# Patient Record
Sex: Male | Born: 1950 | Race: White | Hispanic: No | Marital: Married | Smoking: Current every day smoker
Health system: Southern US, Community
[De-identification: ages and names within clinical notes are randomized; demographics above are authoritative.]

## PROBLEM LIST (undated history)

## (undated) DIAGNOSIS — I2510 Atherosclerotic heart disease of native coronary artery without angina pectoris: Secondary | ICD-10-CM

## (undated) DIAGNOSIS — K635 Polyp of colon: Secondary | ICD-10-CM

## (undated) DIAGNOSIS — J45909 Unspecified asthma, uncomplicated: Secondary | ICD-10-CM

## (undated) DIAGNOSIS — K219 Gastro-esophageal reflux disease without esophagitis: Secondary | ICD-10-CM

## (undated) DIAGNOSIS — M1990 Unspecified osteoarthritis, unspecified site: Secondary | ICD-10-CM

## (undated) HISTORY — DX: Polyp of colon: K63.5

## (undated) HISTORY — DX: Unspecified asthma, uncomplicated: J45.909

## (undated) HISTORY — PX: CERVICAL SPINE SURGERY: SHX589

## (undated) HISTORY — DX: Unspecified osteoarthritis, unspecified site: M19.90

## (undated) HISTORY — DX: Atherosclerotic heart disease of native coronary artery without angina pectoris: I25.10

## (undated) HISTORY — PX: LUMBAR LAMINECTOMY: SHX95

## (undated) HISTORY — PX: HERNIA REPAIR: SHX51

## (undated) HISTORY — PX: BACK SURGERY: SHX140

## (undated) HISTORY — PX: CATARACT EXTRACTION, BILATERAL: SHX1313

## (undated) HISTORY — DX: Gastro-esophageal reflux disease without esophagitis: K21.9

---

## 1998-11-09 ENCOUNTER — Ambulatory Visit (HOSPITAL_COMMUNITY): Admission: RE | Admit: 1998-11-09 | Discharge: 1998-11-09 | Payer: Self-pay | Admitting: Family Medicine

## 1998-11-09 ENCOUNTER — Encounter: Payer: Self-pay | Admitting: Family Medicine

## 1999-01-03 ENCOUNTER — Encounter: Payer: Self-pay | Admitting: Neurosurgery

## 1999-01-05 ENCOUNTER — Observation Stay (HOSPITAL_COMMUNITY): Admission: RE | Admit: 1999-01-05 | Discharge: 1999-01-06 | Payer: Self-pay | Admitting: Neurosurgery

## 1999-01-05 ENCOUNTER — Encounter: Payer: Self-pay | Admitting: Neurosurgery

## 1999-02-23 ENCOUNTER — Encounter: Payer: Self-pay | Admitting: Neurosurgery

## 1999-02-23 ENCOUNTER — Ambulatory Visit (HOSPITAL_COMMUNITY): Admission: RE | Admit: 1999-02-23 | Discharge: 1999-02-23 | Payer: Self-pay | Admitting: Neurosurgery

## 1999-05-22 ENCOUNTER — Ambulatory Visit (HOSPITAL_COMMUNITY): Admission: RE | Admit: 1999-05-22 | Discharge: 1999-05-22 | Payer: Self-pay | Admitting: Neurosurgery

## 1999-05-22 ENCOUNTER — Encounter: Payer: Self-pay | Admitting: Neurosurgery

## 1999-06-02 ENCOUNTER — Encounter: Payer: Self-pay | Admitting: Neurosurgery

## 1999-06-02 ENCOUNTER — Ambulatory Visit (HOSPITAL_COMMUNITY): Admission: RE | Admit: 1999-06-02 | Discharge: 1999-06-02 | Payer: Self-pay | Admitting: Neurosurgery

## 2007-05-20 ENCOUNTER — Encounter (INDEPENDENT_AMBULATORY_CARE_PROVIDER_SITE_OTHER): Payer: Self-pay | Admitting: Orthopedic Surgery

## 2007-05-20 ENCOUNTER — Ambulatory Visit (HOSPITAL_COMMUNITY): Admission: AD | Admit: 2007-05-20 | Discharge: 2007-05-21 | Payer: Self-pay | Admitting: Orthopedic Surgery

## 2007-11-14 ENCOUNTER — Encounter: Payer: Self-pay | Admitting: Gastroenterology

## 2007-12-30 ENCOUNTER — Ambulatory Visit: Payer: Self-pay | Admitting: Gastroenterology

## 2007-12-30 DIAGNOSIS — R634 Abnormal weight loss: Secondary | ICD-10-CM

## 2007-12-30 DIAGNOSIS — E538 Deficiency of other specified B group vitamins: Secondary | ICD-10-CM | POA: Insufficient documentation

## 2007-12-30 LAB — CONVERTED CEMR LAB
AFP-Tumor Marker: 1.6 ng/mL (ref 0.0–8.0)
Ammonia: 30 umol/L (ref 11–35)
HCV Ab: NEGATIVE
Hepatitis B Surface Ag: NEGATIVE
INR: 1 (ref 0.8–1.0)
Magnesium: 1.9 mg/dL (ref 1.5–2.5)
Prothrombin Time: 12 s (ref 10.9–13.3)
Sed Rate: 5 mm/hr (ref 0–16)
Tissue Transglutaminase Ab, IgA: 0.5 units (ref <7)
aPTT: 27.8 s (ref 21.7–29.8)

## 2008-01-14 ENCOUNTER — Ambulatory Visit: Payer: Self-pay | Admitting: Gastroenterology

## 2008-01-14 ENCOUNTER — Encounter: Payer: Self-pay | Admitting: Gastroenterology

## 2008-01-15 ENCOUNTER — Telehealth (INDEPENDENT_AMBULATORY_CARE_PROVIDER_SITE_OTHER): Payer: Self-pay | Admitting: *Deleted

## 2008-01-19 ENCOUNTER — Encounter: Payer: Self-pay | Admitting: Gastroenterology

## 2008-01-21 ENCOUNTER — Encounter: Payer: Self-pay | Admitting: Gastroenterology

## 2008-02-17 ENCOUNTER — Ambulatory Visit: Payer: Self-pay | Admitting: Gastroenterology

## 2008-02-17 DIAGNOSIS — K219 Gastro-esophageal reflux disease without esophagitis: Secondary | ICD-10-CM | POA: Insufficient documentation

## 2008-02-17 DIAGNOSIS — D126 Benign neoplasm of colon, unspecified: Secondary | ICD-10-CM | POA: Insufficient documentation

## 2008-02-17 DIAGNOSIS — D500 Iron deficiency anemia secondary to blood loss (chronic): Secondary | ICD-10-CM | POA: Insufficient documentation

## 2008-02-17 DIAGNOSIS — M549 Dorsalgia, unspecified: Secondary | ICD-10-CM | POA: Insufficient documentation

## 2008-03-23 ENCOUNTER — Telehealth: Payer: Self-pay | Admitting: Gastroenterology

## 2008-04-21 ENCOUNTER — Ambulatory Visit (HOSPITAL_COMMUNITY): Admission: RE | Admit: 2008-04-21 | Discharge: 2008-04-21 | Payer: Self-pay | Admitting: General Surgery

## 2008-06-08 ENCOUNTER — Telehealth: Payer: Self-pay | Admitting: Gastroenterology

## 2008-06-28 ENCOUNTER — Telehealth: Payer: Self-pay | Admitting: Gastroenterology

## 2008-06-30 ENCOUNTER — Inpatient Hospital Stay (HOSPITAL_COMMUNITY): Admission: RE | Admit: 2008-06-30 | Discharge: 2008-07-02 | Payer: Self-pay | Admitting: Neurological Surgery

## 2008-07-14 ENCOUNTER — Telehealth: Payer: Self-pay | Admitting: Gastroenterology

## 2008-08-02 ENCOUNTER — Encounter: Admission: RE | Admit: 2008-08-02 | Discharge: 2008-08-02 | Payer: Self-pay | Admitting: Neurological Surgery

## 2008-10-04 ENCOUNTER — Encounter: Admission: RE | Admit: 2008-10-04 | Discharge: 2008-10-04 | Payer: Self-pay | Admitting: Neurological Surgery

## 2008-12-20 ENCOUNTER — Encounter (INDEPENDENT_AMBULATORY_CARE_PROVIDER_SITE_OTHER): Payer: Self-pay | Admitting: *Deleted

## 2009-01-04 ENCOUNTER — Encounter: Admission: RE | Admit: 2009-01-04 | Discharge: 2009-01-04 | Payer: Self-pay | Admitting: Neurological Surgery

## 2009-01-04 ENCOUNTER — Ambulatory Visit: Payer: Self-pay | Admitting: Gastroenterology

## 2009-01-18 ENCOUNTER — Encounter: Admission: RE | Admit: 2009-01-18 | Discharge: 2009-01-18 | Payer: Self-pay | Admitting: Neurological Surgery

## 2009-01-31 ENCOUNTER — Ambulatory Visit: Payer: Self-pay | Admitting: Gastroenterology

## 2009-01-31 ENCOUNTER — Encounter: Payer: Self-pay | Admitting: Gastroenterology

## 2009-02-07 ENCOUNTER — Encounter: Payer: Self-pay | Admitting: Gastroenterology

## 2009-03-03 ENCOUNTER — Ambulatory Visit (HOSPITAL_COMMUNITY): Admission: RE | Admit: 2009-03-03 | Discharge: 2009-03-04 | Payer: Self-pay | Admitting: Neurological Surgery

## 2009-04-12 ENCOUNTER — Encounter: Admission: RE | Admit: 2009-04-12 | Discharge: 2009-04-12 | Payer: Self-pay | Admitting: Neurological Surgery

## 2010-02-06 ENCOUNTER — Encounter: Admission: RE | Admit: 2010-02-06 | Discharge: 2010-02-06 | Payer: Self-pay | Admitting: Neurological Surgery

## 2010-02-16 ENCOUNTER — Ambulatory Visit (HOSPITAL_BASED_OUTPATIENT_CLINIC_OR_DEPARTMENT_OTHER): Admission: RE | Admit: 2010-02-16 | Discharge: 2010-02-16 | Payer: Self-pay | Admitting: General Surgery

## 2010-05-22 ENCOUNTER — Encounter: Payer: Self-pay | Admitting: Neurological Surgery

## 2010-05-30 NOTE — Letter (Signed)
Summary: Patient Notice- Polyp Results   Gastroenterology  [address] [place], [place] [address]   Phone: [phone]  Fax: [phone]        January 19, 2008 MRN: [id number]    Wesley Schultz [address] [place], [place]  [address]    Dear [name],  I am pleased to inform you that the colon polyp(s) removed during your recent colonoscopy was (were) found to be benign (no cancer detected) upon pathologic examination.  I recommend you have a repeat colonoscopy examination in 1 year to look for recurrent polyps, as having colon polyps increases your risk for having recurrent polyps or even colon cancer in the future.  Should you develop new or worsening symptoms of abdominal pain, bowel habit changes or bleeding from the rectum or bowels, please schedule an evaluation with either your primary care physician or with me.  Additional information/recommendations:  __ No further action with gastroenterology is needed at this time. Please      follow-up with your primary care physician for your other healthcare      needs.  __ Please call [phone] to schedule a return visit to review your      situation.  __ Please keep your follow-up visit as already scheduled.  xx__ Continue treatment plan as outlined the day of your exam.  Please call us if you are having persistent problems or have questions about your condition that have not been fully answered at this time.  Sincerely,    Mardella Layman MD [facility]  This letter has been electronically signed by your physician.

## 2010-07-12 LAB — DIFFERENTIAL
Basophils Absolute: 0 10*3/uL (ref 0.0–0.1)
Basophils Relative: 0 % (ref 0–1)
Eosinophils Absolute: 0.2 10*3/uL (ref 0.0–0.7)
Eosinophils Relative: 2 % (ref 0–5)
Lymphocytes Relative: 40 % (ref 12–46)
Lymphs Abs: 2.8 10*3/uL (ref 0.7–4.0)
Monocytes Absolute: 0.6 10*3/uL (ref 0.1–1.0)
Monocytes Relative: 8 % (ref 3–12)
Neutro Abs: 3.5 10*3/uL (ref 1.7–7.7)
Neutrophils Relative %: 50 % (ref 43–77)

## 2010-07-12 LAB — BASIC METABOLIC PANEL
BUN: 20 mg/dL (ref 6–23)
CO2: 28 mEq/L (ref 19–32)
Calcium: 9.1 mg/dL (ref 8.4–10.5)
Chloride: 103 mEq/L (ref 96–112)
Creatinine, Ser: 0.93 mg/dL (ref 0.4–1.5)
GFR calc Af Amer: >60 mL/min (ref >60)
GFR calc non Af Amer: >60 mL/min (ref >60)
Glucose, Bld: 100 mg/dL — ABNORMAL HIGH (ref 70–99)
Potassium: 3.9 mEq/L (ref 3.5–5.1)
Sodium: 137 mEq/L (ref 135–145)

## 2010-07-12 LAB — CBC
HCT: 44.3 % (ref 39.0–52.0)
Hemoglobin: 15 g/dL (ref 13.0–17.0)
MCH: 29.5 pg (ref 26.0–34.0)
MCHC: 33.9 g/dL (ref 30.0–36.0)
MCV: 87 fL (ref 78.0–100.0)
Platelets: 199 10*3/uL (ref 150–400)
RBC: 5.09 MIL/uL (ref 4.22–5.81)
RDW: 12.9 % (ref 11.5–15.5)
WBC: 7 10*3/uL (ref 4.0–10.5)

## 2010-08-02 LAB — BASIC METABOLIC PANEL
BUN: 20 mg/dL (ref 6–23)
CO2: 27 mEq/L (ref 19–32)
Calcium: 8.8 mg/dL (ref 8.4–10.5)
Chloride: 101 mEq/L (ref 96–112)
Creatinine, Ser: 0.77 mg/dL (ref 0.4–1.5)
GFR calc Af Amer: >60 mL/min (ref >60)
GFR calc non Af Amer: >60 mL/min (ref >60)
Glucose, Bld: 99 mg/dL (ref 70–99)
Potassium: 4 mEq/L (ref 3.5–5.1)
Sodium: 135 mEq/L (ref 135–145)

## 2010-08-02 LAB — DIFFERENTIAL
Basophils Absolute: 0 10*3/uL (ref 0.0–0.1)
Basophils Relative: 1 % (ref 0–1)
Eosinophils Absolute: 0.2 10*3/uL (ref 0.0–0.7)
Eosinophils Relative: 3 % (ref 0–5)
Lymphocytes Relative: 36 % (ref 12–46)
Lymphs Abs: 2.3 10*3/uL (ref 0.7–4.0)
Monocytes Absolute: 0.5 10*3/uL (ref 0.1–1.0)
Monocytes Relative: 8 % (ref 3–12)
Neutro Abs: 3.4 10*3/uL (ref 1.7–7.7)
Neutrophils Relative %: 53 % (ref 43–77)

## 2010-08-02 LAB — PROTIME-INR
INR: 0.96 (ref 0.00–1.49)
Prothrombin Time: 12.7 seconds (ref 11.6–15.2)

## 2010-08-02 LAB — CBC
HCT: 44.5 % (ref 39.0–52.0)
Hemoglobin: 14.9 g/dL (ref 13.0–17.0)
MCHC: 33.6 g/dL (ref 30.0–36.0)
MCV: 88.2 fL (ref 78.0–100.0)
Platelets: 173 10*3/uL (ref 150–400)
RBC: 5.05 MIL/uL (ref 4.22–5.81)
RDW: 14.1 % (ref 11.5–15.5)
WBC: 6.5 10*3/uL (ref 4.0–10.5)

## 2010-08-02 LAB — APTT: aPTT: 29 seconds (ref 24–37)

## 2010-08-10 LAB — BASIC METABOLIC PANEL
BUN: 16 mg/dL (ref 6–23)
CO2: 28 mEq/L (ref 19–32)
Calcium: 9.4 mg/dL (ref 8.4–10.5)
Chloride: 106 mEq/L (ref 96–112)
Creatinine, Ser: 0.98 mg/dL (ref 0.4–1.5)
GFR calc Af Amer: >60 mL/min (ref >60)
GFR calc non Af Amer: >60 mL/min (ref >60)
Glucose, Bld: 135 mg/dL — ABNORMAL HIGH (ref 70–99)
Potassium: 4.4 mEq/L (ref 3.5–5.1)
Sodium: 140 mEq/L (ref 135–145)

## 2010-08-10 LAB — DIFFERENTIAL
Basophils Absolute: 0 10*3/uL (ref 0.0–0.1)
Basophils Relative: 1 % (ref 0–1)
Neutro Abs: 4.3 10*3/uL (ref 1.7–7.7)
Neutrophils Relative %: 62 % (ref 43–77)

## 2010-08-10 LAB — CBC
HCT: 47.9 % (ref 39.0–52.0)
Hemoglobin: 16.3 g/dL (ref 13.0–17.0)
MCHC: 34.2 g/dL (ref 30.0–36.0)
MCV: 88.5 fL (ref 78.0–100.0)
Platelets: 185 10*3/uL (ref 150–400)
RBC: 5.41 MIL/uL (ref 4.22–5.81)
RDW: 13.4 % (ref 11.5–15.5)
WBC: 7 10*3/uL (ref 4.0–10.5)

## 2010-08-10 LAB — ABO/RH: ABO/RH(D): A POS

## 2010-08-10 LAB — PROTIME-INR
INR: 1 (ref 0.00–1.49)
Prothrombin Time: 13.2 seconds (ref 11.6–15.2)

## 2010-08-10 LAB — APTT: aPTT: 28 seconds (ref 24–37)

## 2010-09-12 NOTE — Op Note (Signed)
NAMEMATTHER, LABELL NO.:  [id number]   MEDICAL RECORD NO.:  [id number]          PATIENT TYPE:  INP   LOCATION:  3004                         FACILITY:  [facility]   PHYSICIAN:  Tia Alert, MD     DATE OF BIRTH:  06-08-1950   DATE OF PROCEDURE:  06/30/2008  DATE OF DISCHARGE:                               OPERATIVE REPORT   PREOPERATIVE DIAGNOSES:  1. 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  2. Degenerative spondylolisthesis L4-5.  3. 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  4. Failed back syndrome.  5. Back pain.  6. Leg pain.   POSTOPERATIVE DIAGNOSES:  1. 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  2. Degenerative spondylolisthesis L4-5.  3. 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  4. Failed back syndrome.  5. Back pain.  6. Leg pain.   PROCEDURES:  1. Lumbar re-exploration with lumbar decompressive laminectomy, hemi-      facetectomy and foraminotomies L4-5 with decompression of the L4      and the L5 nerve roots, requiring more work than is typically      required as a typical post procedure because of his lumbar spinal      stenosis.  2. Posterior lumbar interbody fusion L4-5 utilizing a 12 x 22-mm PEEK      interbody cage packed with local autograft and Actifuse putty and a      12 x 22-mm tangent interbody bone wedge.  3. Intertransverse arthrodesis L4-5 utilizing locally harvested      morselized autologous bone graft and Actifuse putty.  4. Nonsegmental fixation L4-5 utilizing the legacy pedicle screw      system.   SURGEON:  Tia Alert, MD   ASSISTANT:  Donalee Citrin, MD   ANESTHESIA:  General endotracheal.   COMPLICATIONS:  None apparent.   INDICATIONS FOR PROCEDURE:  Wesley Schultz is a 60 year old gentleman who was  referred with back and leg pain.  He had undergone a previous  microdiskectomy L4-5 on the right side by another Careers adviser.  He had an  MRI and plain films, which showed a grade 1 spondylolisthesis at L4-5.  He had spinal stenosis at L4-5 with facet arthropathy and  degenerative  disk disease.  I recommended a lumbar decompression and instrumented  fusion to address both the stenosis and the segmental instability.  He  understood the risks, benefits, expected outcome, and wished to proceed.   DESCRIPTION OF PROCEDURE:  The patient was taken to the operating room  and after induction of adequate generalized endotracheal anesthesia, he  was rolled into prone position on chest rolls and all pressure points  were padded.  The lumbar region was prepped with DuraPrep and draped in  the usual sterile fashion.  A 10 mL of local anesthesia was injected and  a dorsal midline incision was made and carried down to the lumbosacral  fascia.  The fascia was opened.  The paraspinous musculature was taken  down in a subperiosteal fashion to expose L4-5.  Intraoperative  fluoroscopy confirmed my level and then I took the dissection out over  the facets to expose transverse processes of L4  and L5 bilaterally.  I  then used Leksell rongeur to remove the spinous process, then used the  Kerrison punch to perform a complete laminectomy, hemi facetectomy, and  foraminotomy at L4-5.  The L4 and L5 nerve roots were identified to  decompress.  There was scar tissue on the right.  I was able to detail  the L5 nerve root from the scar tissue and then I found a fairly  significant recurrent disk herniation and large annular tear at L4-5 on  the right side.  He also had a smaller herniation with a small-free  fragment of L4-5 on the left side.  I coagulated the epidural venous  vasculature.  I cut this sharply bilaterally and used sequential  distraction to distract the disk space up to 12 mm and checked this  under fluoroscopy.  We then used a 12-mm rotating cutter and cutting  chisel to prepare the endplates for arthrodesis.  The midline was  prepared with an Epstein curette and total diskectomy was completed.  We  used a 12 x 22-mm tangent interbody bone wedge on the patient's  right  side and a 12 x 22-mm PEEK interbody cage on the patient's left side.  This was packed with local autograft and Actifuse putty.  The midline  was also packed with local autograft and Actifuse putty.  I then turned  my attention to the nonsegmental fixation.  I had localized the pedicle  screw entry zones utilizing surface landmarks and lateral fluoroscopy.  We probed each pedicle with a pedicle probe, tapped each pedicle with a  5.5 tap, probed these pedicle with a ball probe to assure no break in  the cortex and then placed a 65 x 50-mm pedicle screw into the L4  pedicles bilaterally and 65 x 45-mm pedicle screw into the L5 pedicles  bilaterally.  We then decorticated transverse processes and placed a  mixture of local autograft and Actifuse out over these to perform a  transverse arthrodesis L4-5.  We irrigated with saline solution  containing bacitracin.  We then placed our lordotic rods into multiaxial  screw heads of the pedicle screws and locked these into position with  the locking caps and the anti-torque device while achieving compression  of our grafts.  We then inspected nerve roots once again to assure  adequate decompression and palpated along these with a coronary dilator,  then lined the dura with Gelfoam, placed a medium Hemovac drain through  a separate stab incision, then closed the muscle and the fascia with 0  Vicryl closed using subcutaneous and subcuticular tissue with 2-0 and 3-  0 Vicryl and closed skin with Benzoin and Steri-Strips.  The drapes were  removed.  A sterile dressing was applied.  The patient was awakened from  general anesthesia and transferred to recovery room in stable condition.  At the end of the procedure, all sponge, needle and instrument counts  were correct.       Tia Alert, MD  Electronically Signed     DSJ/MEDQ  D:  06/30/2008  T:  07/01/2008  Job:  [id number]

## 2010-09-12 NOTE — Op Note (Signed)
NAMEPAX, Wesley Schultz NO.:  [id number]   MEDICAL RECORD NO.:  [id number]          PATIENT TYPE:  AMB   LOCATION:  DAY                          FACILITY:  [facility]   PHYSICIAN:  Juanetta Gosling, MDDATE OF BIRTH:  [birth year]   DATE OF PROCEDURE:  04/21/2008  DATE OF DISCHARGE:                               OPERATIVE REPORT   PREOPERATIVE DIAGNOSIS:  Back mass as well as a posterior neck mass.   POSTOPERATIVE DIAGNOSIS:  Back mass as well as a posterior neck mass.   PROCEDURE:  Excisional biopsy of neck mass x2.   SURGEON:  Juanetta Gosling, M.D.   ASSISTANT:  None.   ANESTHESIA:  General.   FINDINGS:  These appear to be lipomas that were sent to pathology.   DRAINS:  Penrose drain to posterior neck incision.   ESTIMATED BLOOD LOSS:  Minimal.   COMPLICATIONS:  None.   DISPOSITION:  To recovery room in stable condition.   INDICATIONS:  Mr. Tennyson is a 60 year old male who was referred for a lump  on his posterior neck as well as his back.  It had been slowly  increasing in size.  On exam, he appeared to have lipomas in both  locations, so we discussed biopsying these.   PROCEDURE:  After informed consent was obtained, the patient was taken  to the operating room.  He was administered 1 gm of intravenous  cefazolin.  Serial compression devices were placed on the lower  extremities.  He was administered at breathing treatment prior to this  due to his wheezing.  He was then placed under general endotracheal  anesthesia without complication.  He was then rolled in the prone  position and appropriately padded.  His back and posterior neck were  then prepped and draped in a standard sterile surgical fashion.  A  surgical time-out was then performed.   About an 8-cm incision was made overlying his posterior back mass, and  dissection carried out down to the level of the mass.  This was a  lipoma.  Electrocautery was used to remove this from the muscle as  well  as the surrounding tissues, and this was passed off the table as a  specimen.  Hemostasis was observed in this wound.  I then made about a 4-  cm incision overlying the posterior neck mass and using electrocautery  to remove the mass.  This also tracked all the way down to his muscle.  Following this, I then closed the back fascia with a 2-0 Vicryl to  decrease the dead space, of which there was a minimal amount upon  completion.  I then closed the skin with external 2-0 nylon sutures.  I  closed the posterior neck wound in the same fashion with 2-0 Vicryl as  well as 2-0 nylon to the skin.  I did  leave a small Penrose drain located.  Sterile dressings were then  applied.  I infiltrated 9 cc of 1% lidocaine upon completion of the  operation as well.  He tolerated this well.  He was rolled supine,  extubated in the operating room, and transferred to the recovery room in  stable condition.      Juanetta Gosling, MD  Electronically Signed     MCW/MEDQ  D:  04/21/2008  T:  04/21/2008  Job:  [id number]   cc:   Ernestina Penna, M.D.  Fax: [phone]

## 2010-09-12 NOTE — Op Note (Signed)
Wesley Schultz, Wesley Schultz                  ACCOUNT NO.:  [id number]   MEDICAL RECORD NO.:  [id number]          PATIENT TYPE:  AMB   LOCATION:  DAY                          [facility]   PHYSICIAN:  Marlowe Kays, M.D.  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   OPERATION PERFORMED:  Central, lateral, and foraminal decompression L4-5  right with microdiskectomy and freeing up of L5 nerve root and partial  resection of synovial cyst.   SURGEON:  Dr. Fayrene Fearing [place]   ASSISTANT:  Dr. Worthy Rancher   ANESTHESIA:  General.   PATHOLOGY AND JUSTIFICATION FOR PROCEDURE:  He has had persistent right  leg pain leading to an MRI on 02/17/2007 demonstrating diffuse  spondylosis and what was interpreted as a disk herniation at L4-5 on the  right with a free fragment caudad.  He has failed to respond to  nonsurgical treatment and consequently is here today for the above-  mentioned surgery.  See operative description below for additional  details.   DESCRIPTION OF PROCEDURE:  Prophylactic antibiotics, satisfactory  general anesthesia, knee-chest position on the [place] frame.  The back  was prepped with DuraPrep and with 2 spinal needles and a lateral x-ray,  I tentatively located at the L4-5 interspace.  I then continued draping  the back in a sterile field.  Ioban employed.  Timeout done.  Vertical  midline incision based on the initial x-ray.  Soft tissue was dissected  off what we presumed was the lamina of L4 and L5 and self-retaining  McCullough retractor inserted.  A second lateral x-ray with a Penfield 4  instrument pointing in the intraluminal interval pointed directly to the  L4-5 interspace.   Based on this, we continued our dissection and began  trying to undermine the __________ surface of L4 and L5.  It soon became  apparent that he had very significant stenosis at this point, and this  necessitated a central decompression approach which gave [place] immediate  access to the pathology.  He had a very thick ligamentum flavum  consistent with spinal stenosis which we teased of with 2 and 3 mm  Kerrison rongeurs.  He had a significant lateral recess stenosis which  we gradually decompressed allowing [place] to find the L5 nerve root.  A wide  foraminotomy was performed.  The L5 nerve root was quite adherent to the  underlying tissues.  Working first on the cephalad area, I was able to  locate the L4-5 disk with the spinal need and entered it and got out a  modest amount of disk material.  This freed up the L5 nerve root enough  that we were able to retract it to work distally.  No free fragment was  found.  Instead we found a large synovial cyst which was freed up from  the lateral recess bone.  It was also adherent to the dura deforming the  L5 nerve root somewhat.  We felt that we would do undue damage to the  nerve root and also very likely create a dural tear if we tried to  dissect too much of the synovial cyst off the dura, but we did free it  up from its lateral attachment.  A wide hockey-stick was placed in the  foramen, and it was widely patent and also beneath the L5 nerve root  which was very movable.  We concluded that there was no free fragment  but that it was a synovial cyst which was really the amorphous material  on the MRI.  We then irrigated the wound with sterile saline and placed  Gelfoam soaked in thrombin over the interspace and in the lateral recess  and around the L5 nerve root and dura.  There is no unusual bleeding  after removing the caudal retractors.  The wound was closed in layers  with interrupted #1 Vicryl in the fascia, 2-0 Vicryl subcutaneous  tissues,  and staples in the skin.  Betadine, Adaptic and dry sterile  dressings were applied, and he was gently placed on his PACU bed and  taken there in satisfactory condition with no known complications.           ______________________________  Marlowe Kays, M.D.     JA/MEDQ  D:  05/20/2007  T:  05/21/2007  Job:  [id number]

## 2011-02-02 LAB — DIFFERENTIAL
Basophils Relative: 1 % (ref 0–1)
Lymphocytes Relative: 30 % (ref 12–46)
Monocytes Absolute: 0.6 10*3/uL (ref 0.1–1.0)
Monocytes Relative: 10 % (ref 3–12)
Neutro Abs: 3.6 10*3/uL (ref 1.7–7.7)

## 2011-02-02 LAB — BASIC METABOLIC PANEL
CO2: 31 mEq/L (ref 19–32)
Calcium: 9.4 mg/dL (ref 8.4–10.5)
GFR calc Af Amer: >60 mL/min (ref >60)
Sodium: 142 mEq/L (ref 135–145)

## 2011-02-02 LAB — CBC
Hemoglobin: 16.2 g/dL (ref 13.0–17.0)
MCHC: 32.6 g/dL (ref 30.0–36.0)
RBC: 5.51 MIL/uL (ref 4.22–5.81)

## 2011-05-04 ENCOUNTER — Encounter: Payer: Self-pay | Admitting: Gastroenterology

## 2012-01-10 ENCOUNTER — Encounter: Payer: Self-pay | Admitting: Gastroenterology

## 2012-01-30 ENCOUNTER — Ambulatory Visit (AMBULATORY_SURGERY_CENTER): Payer: Medicare PPO | Admitting: *Deleted

## 2012-01-30 VITALS — Ht 72.0 in | Wt 188.5 lb

## 2012-01-30 DIAGNOSIS — Z1211 Encounter for screening for malignant neoplasm of colon: Secondary | ICD-10-CM

## 2012-01-30 MED ORDER — MOVIPREP 100 G PO SOLR: ORAL | Status: DC

## 2012-02-13 ENCOUNTER — Encounter: Payer: Self-pay | Admitting: Gastroenterology

## 2012-02-13 ENCOUNTER — Ambulatory Visit (AMBULATORY_SURGERY_CENTER): Payer: Medicare PPO | Admitting: Gastroenterology

## 2012-02-13 VITALS — BP 124/62 | HR 58 | Temp 97.0°F | Resp 24 | Ht 72.0 in | Wt 188.0 lb

## 2012-02-13 DIAGNOSIS — Z860100 Personal history of colon polyps, unspecified: Secondary | ICD-10-CM

## 2012-02-13 DIAGNOSIS — Z1211 Encounter for screening for malignant neoplasm of colon: Secondary | ICD-10-CM

## 2012-02-13 DIAGNOSIS — D126 Benign neoplasm of colon, unspecified: Secondary | ICD-10-CM

## 2012-02-13 MED ORDER — SODIUM CHLORIDE 0.9 % IV SOLN: 500.0000 mL | INTRAVENOUS | Status: DC

## 2012-02-13 NOTE — Op Note (Signed)
[facility] [address] [place], [address]   COLONOSCOPY PROCEDURE REPORT  PATIENT: Wesley Schultz, Wesley Schultz  MR#: [id number] BIRTHDATE: Mar 02, 1951 , 61  yrs. old GENDER: Male ENDOSCOPIST: Mardella Layman, MD, [facility] REFERRED BY: PROCEDURE DATE:  02/13/2012 PROCEDURE:   Colonoscopy with snare polypectomy ASA CLASS:   Class II INDICATIONS:patient's personal history of adenomatous colon polyps.  MEDICATIONS: propofol (Diprivan) 200mg  IV  DESCRIPTION OF PROCEDURE:   After the risks and benefits and of the procedure were explained, informed consent was obtained.  A digital rectal exam revealed no abnormalities of the rectum.    The LB CF-[id number]  endoscope was introduced through the anus and advanced to the cecum, which was identified by both the appendix and ileocecal valve .  The quality of the prep was adequate. .  The instrument was then slowly withdrawn as the colon was fully examined.     COLON FINDINGS: The colon was redundant.  Manual abdominal counter-pressure was used to reach the cecum.   A polypoid shaped 2 cm. sessile polyp with a friable surface was found in the descending colon.  Spreading polyp piecemeal removed with hot snare..   The colon mucosa was otherwise normal.     Retroflexed views revealed no abnormalities.     The scope was then withdrawn from the patient and the procedure completed.  COMPLICATIONS: There were no complications. ENDOSCOPIC IMPRESSION: 1.   The colon was redundant 2.   Sessile polyp was found in the descending colon ..adenoma,r/o atypia 3.   The colon mucosa was otherwise normal  RECOMMENDATIONS: 1.  await pathology results 2.   f/u exam 3 years   REPEAT EXAM:  [id number] Christell Constant, MD  _______________________________ eSigned:  Mardella Layman, MD, [facility] 02/13/2012 11:09 AM

## 2012-02-13 NOTE — Patient Instructions (Addendum)
Discharge instructions given with verbal understanding. Handout on polyps given. Resume previous medications. YOU HAD AN ENDOSCOPIC PROCEDURE TODAY AT THE [facility]: Refer to the procedure report that was given to you for any specific questions about what was found during the examination.  If the procedure report does not answer your questions, please call your gastroenterologist to clarify.  If you requested that your care partner not be given the details of your procedure findings, then the procedure report has been included in a sealed envelope for you to review at your convenience later.  YOU SHOULD EXPECT: Some feelings of bloating in the abdomen. Passage of more gas than usual.  Walking can help get rid of the air that was put into your GI tract during the procedure and reduce the bloating. If you had a lower endoscopy (such as a colonoscopy or flexible sigmoidoscopy) you may notice spotting of blood in your stool or on the toilet paper. If you underwent a bowel prep for your procedure, then you may not have a normal bowel movement for a few days.  DIET: Your first meal following the procedure should be a light meal and then it is ok to progress to your normal diet.  A half-sandwich or bowl of soup is an example of a good first meal.  Heavy or fried foods are harder to digest and may make you feel nauseous or bloated.  Likewise meals heavy in dairy and vegetables can cause extra gas to form and this can also increase the bloating.  Drink plenty of fluids but you should avoid alcoholic beverages for 24 hours.  ACTIVITY: Your care partner should take you home directly after the procedure.  You should plan to take it easy, moving slowly for the rest of the day.  You can resume normal activity the day after the procedure however you should NOT DRIVE or use heavy machinery for 24 hours (because of the sedation medicines used during the test).    SYMPTOMS TO REPORT IMMEDIATELY: A  gastroenterologist can be reached at any hour.  During normal business hours, 8:30 AM to 5:00 PM Monday through Friday, call [phone].  After hours and on weekends, please call the GI answering service at [phone] who will take a message and have the physician on call contact you.   Following lower endoscopy (colonoscopy or flexible sigmoidoscopy):  Excessive amounts of blood in the stool  Significant tenderness or worsening of abdominal pains  Swelling of the abdomen that is new, acute  Fever of 100F or higher  FOLLOW UP: If any biopsies were taken you will be contacted by phone or by letter within the next 1-3 weeks.  Call your gastroenterologist if you have not heard about the biopsies in 3 weeks.  Our staff will call the home number listed on your records the next business day following your procedure to check on you and address any questions or concerns that you may have at that time regarding the information given to you following your procedure. This is a courtesy call and so if there is no answer at the home number and we have not heard from you through the emergency physician on call, we will assume that you have returned to your regular daily activities without incident.  SIGNATURES/CONFIDENTIALITY: You and/or your care partner have signed paperwork which will be entered into your electronic medical record.  These signatures attest to the fact that that the information above on your After Visit Summary has   been reviewed and is understood.  Full responsibility of the confidentiality of this discharge information lies with you and/or your care-partner. 

## 2012-02-14 ENCOUNTER — Telehealth: Payer: Self-pay | Admitting: *Deleted

## 2012-02-14 NOTE — Telephone Encounter (Signed)
  Follow up Call-  Call back number 02/13/2012  Post procedure Call Back phone  # [phone]  Permission to leave phone message Yes     No answer and no answering picked up so no message left.

## 2012-02-18 ENCOUNTER — Encounter: Payer: Self-pay | Admitting: Gastroenterology

## 2012-06-20 ENCOUNTER — Ambulatory Visit
Admission: RE | Admit: 2012-06-20 | Discharge: 2012-06-20 | Disposition: A | Discharge status: Home / Self Care | Payer: Medicare PPO | Source: Ambulatory Visit | Attending: Family Medicine | Admitting: Family Medicine

## 2012-06-20 ENCOUNTER — Other Ambulatory Visit: Payer: Self-pay | Admitting: Family Medicine

## 2012-06-20 DIAGNOSIS — R220 Localized swelling, mass and lump, head: Secondary | ICD-10-CM

## 2012-06-20 MED ORDER — IOHEXOL 300 MG/ML  SOLN
75.0000 mL | Freq: Once | INTRAMUSCULAR | Status: AC | PRN
  Administered 2012-06-20: 75 mL via INTRAVENOUS

## 2013-12-22 ENCOUNTER — Ambulatory Visit (INDEPENDENT_AMBULATORY_CARE_PROVIDER_SITE_OTHER): Payer: Medicare PPO | Admitting: Family Medicine

## 2013-12-22 ENCOUNTER — Encounter (INDEPENDENT_AMBULATORY_CARE_PROVIDER_SITE_OTHER): Payer: Self-pay

## 2013-12-22 VITALS — BP 119/73 | HR 71 | Temp 96.9°F | Ht 72.0 in | Wt 198.0 lb

## 2013-12-22 DIAGNOSIS — J45909 Unspecified asthma, uncomplicated: Secondary | ICD-10-CM

## 2013-12-22 DIAGNOSIS — K2100 Gastro-esophageal reflux disease with esophagitis, without bleeding: Secondary | ICD-10-CM

## 2013-12-22 DIAGNOSIS — B351 Tinea unguium: Secondary | ICD-10-CM

## 2013-12-22 DIAGNOSIS — R21 Rash and other nonspecific skin eruption: Secondary | ICD-10-CM

## 2013-12-22 MED ORDER — OMEPRAZOLE 20 MG PO CPDR: 20.0000 mg | DELAYED_RELEASE_CAPSULE | Freq: Two times a day (BID) | ORAL | Status: DC

## 2013-12-22 MED ORDER — CLOTRIMAZOLE-BETAMETHASONE 1-0.05 % EX CREA: 1.0000 "application " | TOPICAL_CREAM | Freq: Two times a day (BID) | CUTANEOUS | Status: DC

## 2013-12-22 MED ORDER — METHYLPREDNISOLONE ACETATE 80 MG/ML IJ SUSP
80.0000 mg | Freq: Once | INTRAMUSCULAR | Status: AC
  Administered 2013-12-22: 80 mg via INTRAMUSCULAR

## 2013-12-22 MED ORDER — TERBINAFINE HCL 250 MG PO TABS: 250.0000 mg | ORAL_TABLET | Freq: Every day | ORAL | Status: DC

## 2013-12-22 MED ORDER — ALBUTEROL SULFATE HFA 108 (90 BASE) MCG/ACT IN AERS: 2.0000 | INHALATION_SPRAY | Freq: Four times a day (QID) | RESPIRATORY_TRACT | Status: DC | PRN

## 2013-12-24 NOTE — Progress Notes (Signed)
   Subjective:    Patient ID: Wesley Schultz, male    DOB: 27-Mar-1951, 63 y.o.   MRN: [id number]  HPI  This 63 y.o. male presents for evaluation of rash on lower extremities and mycotic toenails.  Review of Systems    No chest pain, SOB, HA, dizziness, vision change, N/V, diarrhea, constipation, dysuria, urinary urgency or frequency, myalgias, arthralgias or rash.  Objective:   Physical Exam  Vital signs noted  Well developed well nourished male.  HEENT - Head atraumatic Normocephalic                Eyes - PERRLA, Conjuctiva - clear Sclera- Clear EOMI                Ears - EAC's Wnl TM's Wnl Gross Hearing WNL                Nose - Nares patent                 Throat - oropharanx wnl Respiratory - Lungs CTA bilateral Cardiac - RRR S1 and S2 without murmur GI - Abdomen soft Nontender and bowel sounds active x 4 Extremities - No edema. Neuro - Grossly intact. Skin - rash on bilateral lower extremities and toes with onychomycosis     Assessment & Plan:  Rash and nonspecific skin eruption - Plan: clotrimazole-betamethasone (LOTRISONE) cream, methylPREDNISolone acetate (DEPO-MEDROL) injection 80 mg  Onychomycosis - Plan: terbinafine (LAMISIL) 250 MG tablet  Gastroesophageal reflux disease with esophagitis - Plan: omeprazole (PRILOSEC) 20 MG capsule  Unspecified asthma(493.90) - Plan: albuterol (PROVENTIL HFA;VENTOLIN HFA) 108 (90 BASE) MCG/ACT inhaler  Lysbeth Penner FNP

## 2014-03-30 DIAGNOSIS — Z029 Encounter for administrative examinations, unspecified: Secondary | ICD-10-CM

## 2014-06-07 ENCOUNTER — Other Ambulatory Visit: Payer: Self-pay | Admitting: Family Medicine

## 2014-06-07 DIAGNOSIS — J45909 Unspecified asthma, uncomplicated: Secondary | ICD-10-CM

## 2014-06-07 MED ORDER — ALBUTEROL SULFATE HFA 108 (90 BASE) MCG/ACT IN AERS: 2.0000 | INHALATION_SPRAY | Freq: Four times a day (QID) | RESPIRATORY_TRACT | Status: DC | PRN

## 2014-06-07 NOTE — Telephone Encounter (Signed)
done

## 2014-09-06 ENCOUNTER — Other Ambulatory Visit: Payer: Self-pay | Admitting: Family Medicine

## 2014-09-09 ENCOUNTER — Other Ambulatory Visit: Payer: Self-pay

## 2014-09-09 DIAGNOSIS — K2100 Gastro-esophageal reflux disease with esophagitis, without bleeding: Secondary | ICD-10-CM

## 2014-09-09 MED ORDER — OMEPRAZOLE 20 MG PO CPDR: 20.0000 mg | DELAYED_RELEASE_CAPSULE | Freq: Two times a day (BID) | ORAL | Status: DC

## 2014-09-09 NOTE — Telephone Encounter (Signed)
Last seen 12/22/13 B [place]

## 2014-10-28 ENCOUNTER — Ambulatory Visit: Payer: Medicare PPO | Admitting: *Deleted

## 2014-11-26 ENCOUNTER — Encounter: Payer: Self-pay | Admitting: Family

## 2014-11-26 ENCOUNTER — Ambulatory Visit (INDEPENDENT_AMBULATORY_CARE_PROVIDER_SITE_OTHER): Payer: Medicare PPO | Admitting: Family

## 2014-11-26 VITALS — BP 131/79 | HR 78 | Temp 98.2°F | Ht 72.0 in | Wt 187.0 lb

## 2014-11-26 DIAGNOSIS — L237 Allergic contact dermatitis due to plants, except food: Secondary | ICD-10-CM

## 2014-11-26 MED ORDER — TERBINAFINE HCL 1 % EX CREA: 1.0000 "application " | TOPICAL_CREAM | Freq: Two times a day (BID) | CUTANEOUS | Status: DC

## 2014-11-26 MED ORDER — METHYLPREDNISOLONE 4 MG PO TBPK: ORAL_TABLET | ORAL | Status: DC

## 2014-11-26 MED ORDER — METHYLPREDNISOLONE ACETATE 80 MG/ML IJ SUSP
80.0000 mg | Freq: Once | INTRAMUSCULAR | Status: AC
  Administered 2014-11-26: 80 mg via INTRAMUSCULAR

## 2014-11-26 NOTE — Progress Notes (Signed)
   Subjective:    Patient ID: Wesley Schultz, male    DOB: [birth year], 64 y.o.   MRN: [id number]  Rash This is a new problem. The current episode started 1 to 4 weeks ago. The problem has been gradually worsening since onset. The affected locations include the right foot, right ankle and left foot. The rash is characterized by redness and itchiness. He was exposed to an insect bite/sting and a new animal (PT states his cat has fleas). Pertinent negatives include no congestion, diarrhea, eye pain, sore throat or vomiting. Past treatments include anti-itch cream and moisturizer. The treatment provided mild relief.      Review of Systems  Constitutional: Negative.   HENT: Negative.  Negative for congestion and sore throat.   Eyes: Negative for pain.  Respiratory: Negative.   Cardiovascular: Negative.   Gastrointestinal: Negative.  Negative for vomiting and diarrhea.  Endocrine: Negative.   Genitourinary: Negative.   Musculoskeletal: Negative.   Skin: Positive for rash.  Neurological: Negative.   Hematological: Negative.   Psychiatric/Behavioral: Negative.   All other systems reviewed and are negative.      Objective:   Physical Exam  Constitutional: He is oriented to person, place, and time. He appears well-developed and well-nourished. No distress.  HENT:  Head: Normocephalic.  Eyes: Pupils are equal, round, and reactive to light. Right eye exhibits no discharge. Left eye exhibits no discharge.  Neck: Normal range of motion. Neck supple. No thyromegaly present.  Cardiovascular: Normal rate, regular rhythm, normal heart sounds and intact distal pulses.   No murmur heard. Pulmonary/Chest: Effort normal and breath sounds normal. No respiratory distress. He has no wheezes.  Abdominal: Soft. Bowel sounds are normal. He exhibits no distension. There is no tenderness.  Musculoskeletal: Normal range of motion. He exhibits no edema or tenderness.  Neurological: He is alert and oriented to  person, place, and time. He has normal reflexes. No cranial nerve deficit. Coordination abnormal.  Skin: Skin is warm and dry. Rash noted. Rash is pustular. There is erythema.  Generalized pustular erythemas rash on right lower leg and ankle. scattered on left lower leg and right arm.   Psychiatric: He has a normal mood and affect. His behavior is normal. Judgment and thought content normal.  Vitals reviewed.   BP 131/79 mmHg  Pulse 78  Temp(Src) 98.2 F (36.8 C) (Oral)  Ht 6' (1.829 m)  Wt 187 lb (84.823 kg)  BMI 25.36 kg/m2       Assessment & Plan:  1. Poison oak dermatitis -Good hand hygiene -Do not scratch or pick at -Avoid contact with allergen -Wear protective clothing while outside -RTO prn - methylPREDNISolone acetate (DEPO-MEDROL) injection 80 mg; Inject 1 mL (80 mg total) into the muscle once. - methylPREDNISolone (MEDROL DOSEPAK) 4 MG TBPK tablet; Use as directed  Dispense: 21 tablet; Refill: 0  Evelina Dun, FNP

## 2014-11-26 NOTE — Patient Instructions (Signed)
Poison [facility] is an inflammation of the skin (contact dermatitis). It is caused by contact with the allergens on the leaves of the oak (toxicodendron) plants. Depending on your sensitivity, the rash may consist simply of redness and itching, or it may also progress to blisters which may break open (rupture). These must be well cared for to prevent secondary germ (bacterial) infection as these infections can lead to scarring. The eyes may also get puffy. The puffiness is worst in the morning and gets better as the day progresses. Healing is best accomplished by keeping any open areas dry, clean, covered with a bandage, and covered with an antibacterial ointment if needed. Without secondary infection, this dermatitis usually heals without scarring within 2 to 3 weeks without treatment. HOME CARE INSTRUCTIONS When you have been exposed to poison oak, it is very important to thoroughly wash with soap and water as soon as the exposure has been discovered. You have about one half hour to remove the plant resin before it will cause the rash. This cleaning will quickly destroy the oil or antigen on the skin (the antigen is what causes the rash). Wash aggressively under the fingernails as any plant resin still there will continue to spread the rash. Do not rub skin vigorously when washing affected area. Poison oak cannot spread if no oil from the plant remains on your body. Rash that has progressed to weeping sores (lesions) will not spread the rash unless you have not washed thoroughly. It is also important to clean any clothes you have been wearing as they may carry active allergens which will spread the rash, even several days later. Avoidance of the plant in the future is the best measure. Poison oak plants can be recognized by the number of leaves. Generally, poison oak has three leaves with flowering branches on a single stem. Diphenhydramine may be purchased over the counter and used as needed for  itching. Do not drive with this medication if it makes you drowsy. Ask your caregiver about medication for children. SEEK IMMEDIATE MEDICAL CARE IF:   Open areas of the rash develop.  You notice redness extending beyond the area of the rash.  There is a pus like discharge.  There is increased pain.  Other signs of infection develop (such as fever). Document Released: 10/21/2002 Document Revised: 07/09/2011 Document Reviewed: 03/02/2009 [facility] Patient Information 2015 [place], [place]. This information is not intended to replace advice given to you by your health care provider. Make sure you discuss any questions you have with your health care provider.

## 2014-11-26 NOTE — Addendum Note (Signed)
Addended by: Evelina Dun A on: 11/26/2014 02:48 PM   Modules accepted: Orders

## 2014-12-20 ENCOUNTER — Ambulatory Visit: Payer: Self-pay | Admitting: General Surgery

## 2014-12-20 NOTE — H&P (Unsigned)
History of Present Illness Ralene Ok MD; 12/20/2014 2:42 PM) The patient is a 64 year old male who presents with an inguinal hernia. The patient is a 64 year old male who is referred by Dr. Lynnae Prude for evaluation of a left inguinal hernia. The patient works as a regular for a [employer] states he was lifting something heavy. He felt a pop in his left inguinal area and thereafter noticed a bulge. The patient states that he has some pain at times. He's had no signs or symptoms of incarceration or strangulation. 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 His cardiologist is in [place]. He will call us back with the name.   Other Problems Elbert Ewings, CMA; 12/20/2014 2:24 PM) Hemorrhoids High blood pressure Hypercholesterolemia Vascular Disease  Past Surgical History Elbert Ewings, CMA; 12/20/2014 2:24 PM) Coronary Artery Bypass Graft Foot Surgery Left. Oral Surgery  Diagnostic Studies History Elbert Ewings, [place]; 12/20/2014 2:24 PM) Colonoscopy never  Allergies Elbert Ewings, CMA; 12/20/2014 2:25 PM) No Known Drug Allergies08/22/2016  Medication History Elbert Ewings, [place]; 12/20/2014 2:25 PM) No Current Medications Medications Reconciled  Social History Elbert Ewings, [place]; 12/20/2014 2:24 PM) Alcohol use Occasional alcohol use. Caffeine use Carbonated beverages, Coffee, Tea. Illicit drug use Remotely quit drug use. Tobacco use Former smoker.  Family History Elbert Ewings, [place]; 12/20/2014 2:24 PM) Breast Cancer Mother. Heart Disease Mother. Hypertension Mother. Prostate Cancer Brother.  Review of Systems Elbert Ewings CMA; 12/20/2014 2:24 PM) General Not Present- Appetite Loss, Chills, Fatigue, Fever, Night Sweats, Weight Gain and Weight Loss. Skin Not Present- Change in Wart/Mole, Dryness, Hives, Jaundice, New Lesions, Non-Healing Wounds, Rash and Ulcer. HEENT Present- Wears glasses/contact lenses. Not Present- Earache, Hearing Loss, Hoarseness,  Nose Bleed, Oral Ulcers, Ringing in the Ears, Seasonal Allergies, Sinus Pain, Sore Throat, Visual Disturbances and Yellow Eyes. Respiratory Not Present- Bloody sputum, Chronic Cough, Difficulty Breathing, Snoring and Wheezing. Breast Not Present- Breast Mass, Breast Pain, Nipple Discharge and Skin Changes. Cardiovascular Not Present- Chest Pain, Difficulty Breathing Lying Down, Leg Cramps, Palpitations, Rapid Heart Rate, Shortness of Breath and Swelling of Extremities. Gastrointestinal Not Present- Abdominal Pain, Bloating, Bloody Stool, Change in Bowel Habits, Chronic diarrhea, Constipation, Difficulty Swallowing, Excessive gas, Gets full quickly at meals, Hemorrhoids, Indigestion, Nausea, Rectal Pain and Vomiting. Male Genitourinary Present- Impotence. Not Present- Blood in Urine, Change in Urinary Stream, Frequency, Nocturia, Painful Urination, Urgency and Urine Leakage. Musculoskeletal Not Present- Back Pain, Joint Pain, Joint Stiffness, Muscle Pain, Muscle Weakness and Swelling of Extremities. Neurological Not Present- Decreased Memory, Fainting, Headaches, Numbness, Seizures, Tingling, Tremor, Trouble walking and Weakness. Psychiatric Not Present- Anxiety, Bipolar, Change in Sleep Pattern, Depression, Fearful and Frequent crying. Endocrine Not Present- Cold Intolerance, Excessive Hunger, Hair Changes, Heat Intolerance, Hot flashes and New Diabetes. Hematology Not Present- Easy Bruising, Excessive bleeding, Gland problems, HIV and Persistent Infections.   Vitals Elbert Ewings CMA; 12/20/2014 2:25 PM) 12/20/2014 2:25 PM Weight: 190 lb Height: 73in Body Surface Area: 2.11 m Body Mass Index: 25.07 kg/m Temp.: 98.36F(Oral)  Pulse: 85 (Regular)  BP: 138/86 (Sitting, Left Arm, Standard)    Physical Exam Ralene Ok MD; 12/20/2014 2:41 PM) General Mental Status-Alert. General Appearance-Consistent with stated age. Hydration-Well hydrated. Voice-Normal.  Head and  Neck Head-normocephalic, atraumatic with no lesions or palpable masses. Trachea-midline. Thyroid Gland Characteristics - normal size and consistency.  Chest and Lung Exam Chest and lung exam reveals -quiet, even and easy respiratory effort with no use of accessory muscles and on auscultation, normal breath sounds, no  adventitious sounds and normal vocal resonance. Inspection Chest Wall - Normal. Back - normal.  Cardiovascular Cardiovascular examination reveals -normal heart sounds, regular rate and rhythm with no murmurs and normal pedal pulses bilaterally.  Abdomen Inspection Skin - Scar - no surgical scars. Hernias - Inguinal hernia - Left - Reducible. Palpation/Percussion Normal exam - Soft, Non Tender, No Rebound tenderness, No Rigidity (guarding) and No hepatosplenomegaly. Auscultation Normal exam - Bowel sounds normal.    Assessment & Plan Ralene Ok MD; 12/20/2014 2:43 PM) LEFT INGUINAL HERNIA (550.90  K40.90) Impression: 64 year old male with a left inguinal hernia  1. The patient will like to proceed to the operating room for laparoscopic left inguinal hernia repair.  2. I discussed with the patient the signs and symptoms of incarceration and strangulation and the need to proceed to the ER should they occur.  3. I discussed with the patient the risks and benefits of the procedure to include but not limited to: Infection, bleeding, damage to surrounding structures, possible need for further surgery, possible nerve pain, and possible recurrence. The patient was understanding and wishes to proceed.

## 2014-12-23 ENCOUNTER — Telehealth: Payer: Self-pay | Admitting: *Deleted

## 2014-12-23 NOTE — Telephone Encounter (Signed)
Fax received from [facility] requesting surgical clearance for hernia repair.  The patient has not been seen since 2013.  I called [facility] and advised that patient would need an office visit before we can give clearance.

## 2015-01-06 ENCOUNTER — Ambulatory Visit (INDEPENDENT_AMBULATORY_CARE_PROVIDER_SITE_OTHER): Payer: Medicare PPO | Admitting: Family Medicine

## 2015-01-06 ENCOUNTER — Encounter: Payer: Self-pay | Admitting: Family Medicine

## 2015-01-06 VITALS — BP 125/72 | HR 69 | Temp 98.3°F | Ht 72.0 in | Wt 185.4 lb

## 2015-01-06 DIAGNOSIS — J449 Chronic obstructive pulmonary disease, unspecified: Secondary | ICD-10-CM | POA: Diagnosis not present

## 2015-01-06 DIAGNOSIS — Z13220 Encounter for screening for lipoid disorders: Secondary | ICD-10-CM

## 2015-01-06 DIAGNOSIS — Z125 Encounter for screening for malignant neoplasm of prostate: Secondary | ICD-10-CM | POA: Diagnosis not present

## 2015-01-06 DIAGNOSIS — Z131 Encounter for screening for diabetes mellitus: Secondary | ICD-10-CM | POA: Diagnosis not present

## 2015-01-06 DIAGNOSIS — Z0000 Encounter for general adult medical examination without abnormal findings: Secondary | ICD-10-CM

## 2015-01-06 MED ORDER — ALBUTEROL SULFATE 108 (90 BASE) MCG/ACT IN AEPB: 2.0000 | INHALATION_SPRAY | RESPIRATORY_TRACT | Status: DC | PRN

## 2015-01-06 NOTE — Assessment & Plan Note (Signed)
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 He cannot usually afford his inhalers or rescue inhalers. We had coupon for a pro-air so gave him a prescription for that in the coupon.

## 2015-01-06 NOTE — Patient Instructions (Signed)

## 2015-01-06 NOTE — Progress Notes (Signed)
Subjective:   Wesley Schultz is a 64 y.o. male who presents for an Initial Medicare Annual Wellness Visit.  Review of Systems  Review of Systems  Constitutional: Negative for fever, chills and weight loss.  HENT: Negative for congestion and ear pain.   Eyes: Negative for blurred vision, photophobia and pain.  Respiratory: Negative for cough, shortness of breath and wheezing.   Cardiovascular: Negative for chest pain, palpitations, orthopnea, claudication and leg swelling.  Gastrointestinal: Negative for heartburn, abdominal pain, constipation and melena.  Genitourinary: Negative for dysuria, urgency and hematuria.  Musculoskeletal: Negative for myalgias, back pain and falls.  Skin: Negative for itching and rash.  Neurological: Negative for dizziness, tremors, focal weakness, weakness and headaches.  Endo/Heme/Allergies: Does not bruise/bleed easily.  Psychiatric/Behavioral: Negative for depression, suicidal ideas and substance abuse.    Cardiac Risk Factors include: advanced age (>92men, >65 women);male gender;smoking/ tobacco exposure    Objective:    Today's Vitals   01/06/15 1345  BP: 125/72  Pulse: 69  Temp: 98.3 F (36.8 C)  TempSrc: Oral  Height: 6' (1.829 m)  Weight: 185 lb 6.4 oz (84.097 kg)    Current Medications (verified) Outpatient Encounter Prescriptions as of 01/06/2015  Medication Sig  . aspirin 81 MG tablet Take 81 mg by mouth daily.  Marland Kitchen docusate sodium (COLACE) 50 MG capsule Take 50 mg by mouth daily.  . fish oil-omega-3 fatty acids 1000 MG capsule Take 1 g by mouth daily.   Marland Kitchen guaifenesin (MUCUS RELIEF) 400 MG TABS tablet Take 400 mg by mouth at bedtime.  Marland Kitchen omeprazole (PRILOSEC) 20 MG capsule Take 1 capsule (20 mg total) by mouth 2 (two) times daily before a meal.  . [DISCONTINUED] albuterol (PROVENTIL HFA;VENTOLIN HFA) 108 (90 BASE) MCG/ACT inhaler Inhale 2 puffs into the lungs every 6 (six) hours as needed for wheezing or shortness of breath.  .  [DISCONTINUED] aspirin 325 MG tablet Take 325 mg by mouth daily.  . [DISCONTINUED] clotrimazole-betamethasone (LOTRISONE) cream Apply 1 application topically 2 (two) times daily.  . [DISCONTINUED] methylPREDNISolone (MEDROL DOSEPAK) 4 MG TBPK tablet Use as directed  . [DISCONTINUED] terbinafine (LAMISIL) 1 % cream Apply 1 application topically 2 (two) times daily.  . [DISCONTINUED] terbinafine (LAMISIL) 250 MG tablet Take 1 tablet (250 mg total) by mouth daily.   No facility-administered encounter medications on file as of 01/06/2015.    Allergies (verified) Codeine   History: Past Medical History  Diagnosis Date  . GERD (gastroesophageal reflux disease)   . Arthritis   . Asthma   . Colon polyps    Past Surgical History  Procedure Laterality Date  . Back surgery      2008, 2009, 2010  . Hernia repair    . Cervical spine surgery    . Lumbar laminectomy    . Cataract extraction, bilateral     Family History  Problem Relation Age of Onset  . Colon cancer Sister 66  . Stomach cancer Neg Hx   . Diabetes Mother   . Seizures Mother   . Heart disease Father    Social History   Occupational History  . Not on file.   Social History Main Topics  . Smoking status: Current Every Day Smoker -- 1.00 packs/day for 40 years    Types: Cigarettes  . Smokeless tobacco: Never Used  . Alcohol Use: No  . Drug Use: No  . Sexual Activity: No   Tobacco Counseling Ready to quit: No Counseling given: Not Answered  Activities of Daily Living In your present state of health, do you have any difficulty performing the following activities: 01/06/2015 01/06/2015  Hearing? N N  Vision? N N  Difficulty concentrating or making decisions? N N  Walking or climbing stairs? N N  Dressing or bathing? N N  Doing errands, shopping? N N  Preparing Food and eating ? N N  Using the Toilet? N N  In the past six months, have you accidently leaked urine? N N  Do you have problems with loss of bowel  control? N N  Managing your Medications? N N  Managing your Finances? N N  Housekeeping or managing your Housekeeping? N N    Immunizations and Health Maintenance Immunization History  Administered Date(s) Administered  . Influenza,inj,Quad PF,36+ Mos 02/28/2013  . Influenza-Unspecified 01/21/2014  . Pneumococcal Conjugate-13 03/15/2014  . Tdap 05/31/2014  . Zoster 01/21/2014   There are no preventive care reminders to display for this patient.  Patient Care Team: Sharion Balloon, FNP as PCP - General (Nurse Practitioner) Fransisca Kaufmann Dettinger, MD as Consulting Physician (Family Medicine)  Indicate any recent Medical Services you may have received from other than [facility] providers in the past year (date may be approximate).    Assessment:   This is a routine wellness examination for Wesley Schultz.   Hearing/Vision screen Vision Screening Comments: Eye exam June 2016, patient unsure of doctor's name, was in [place], [place]  Dietary issues and exercise activities discussed: Current Exercise Habits:: Home exercise routine, Type of exercise: walking, Time (Minutes): 60, Frequency (Times/Week): 5, Weekly Exercise (Minutes/Week): 300, Intensity: Moderate  Goals    None     Depression Screen PHQ 2/9 Scores 01/06/2015 11/26/2014  PHQ - 2 Score 0 0    Fall Risk Fall Risk  01/06/2015 11/26/2014  Falls in the past year? No No    Cognitive Function: MMSE - Mini Mental State Exam 01/06/2015  Orientation to time 5  Orientation to Place 5  Registration 3  Attention/ Calculation 5  Recall 3  Language- name 2 objects 2  Language- repeat 1  Language- follow 3 step command 3  Language- read & follow direction 1  Write a sentence 1  Copy design 1  Total score 30    Screening Tests Health Maintenance  Topic Date Due  . INFLUENZA VACCINE  02/05/2015 (Originally 11/29/2014)  . HIV Screening  01/06/2016 (Originally 10[birth year]/1966)  . COLONOSCOPY  02/13/2015  . TETANUS/TDAP  05/31/2024  . ZOSTAVAX   Completed  . Hepatitis C Screening  Completed        Plan:     Problem List Items Addressed This Visit    None    Visit Diagnoses    Medicare annual wellness visit, initial    -  Primary    Screening for diabetes mellitus        Relevant Orders    Basic Metabolic Panel with GFR    POCT glycosylated hemoglobin (Hb A1C)    Screening for lipoid disorders        Relevant Orders    Lipid panel    Prostate cancer screening        Relevant Orders    PSA    Routine history and physical examination of adult        COPD, mild        Relevant Medications    guaifenesin (MUCUS RELIEF) 400 MG TABS tablet    Albuterol Sulfate (PROAIR RESPICLICK) 542 (90 BASE) MCG/ACT AEPB  During the course of the visit Lawson was educated and counseled about the following appropriate screening and preventive services:   Vaccines to include Pneumoccal, Influenza, Hepatitis B, Td, Zostavax, HCV  Electrocardiogram  Colorectal cancer screening  Cardiovascular disease screening  Diabetes screening  Glaucoma screening  Nutrition counseling  Prostate cancer screening  Smoking cessation counseling  Patient Instructions (the written plan) were given to the patient.   Worthy Rancher, MD   01/06/2015        Subjective:    Wesley Schultz is a 64 y.o. male who presents for Medicare Initial preventive examination.   Preventive Screening-Counseling & Management  Tobacco History  Smoking status  . Current Every Day Smoker -- 1.00 packs/day for 40 years  . Types: Cigarettes  Smokeless tobacco  . Never Used    Problems Prior to Visit 1.   Current Problems (verified) Patient Active Problem List   Diagnosis Date Noted  . ADENOMATOUS COLONIC POLYP 02/17/2008  . IRON DEFICIENCY ANEMIA SECONDARY TO BLOOD LOSS 02/17/2008  . GERD 02/17/2008  . BACK PAIN, CHRONIC 02/17/2008  . B12 DEFICIENCY 12/30/2007  . WEIGHT LOSS 12/30/2007    Medications Prior to Visit Current Outpatient  Prescriptions on File Prior to Visit  Medication Sig Dispense Refill  . fish oil-omega-3 fatty acids 1000 MG capsule Take 1 g by mouth daily.     Marland Kitchen omeprazole (PRILOSEC) 20 MG capsule Take 1 capsule (20 mg total) by mouth 2 (two) times daily before a meal. 180 capsule 3   No current facility-administered medications on file prior to visit.    Current Medications (verified) Current Outpatient Prescriptions  Medication Sig Dispense Refill  . aspirin 81 MG tablet Take 81 mg by mouth daily.    Marland Kitchen docusate sodium (COLACE) 50 MG capsule Take 50 mg by mouth daily.    . fish oil-omega-3 fatty acids 1000 MG capsule Take 1 g by mouth daily.     Marland Kitchen guaifenesin (MUCUS RELIEF) 400 MG TABS tablet Take 400 mg by mouth at bedtime.    Marland Kitchen omeprazole (PRILOSEC) 20 MG capsule Take 1 capsule (20 mg total) by mouth 2 (two) times daily before a meal. 180 capsule 3   No current facility-administered medications for this visit.     Allergies (verified) Codeine   PAST HISTORY  Family History Family History  Problem Relation Age of Onset  . Colon cancer Sister 26  . Stomach cancer Neg Hx   . Diabetes Mother   . Seizures Mother   . Heart disease Father     Social History Social History  Substance Use Topics  . Smoking status: Current Every Day Smoker -- 1.00 packs/day for 40 years    Types: Cigarettes  . Smokeless tobacco: Never Used  . Alcohol Use: No    Are there smokers in your home (other than you)?  Yes  Risk Factors Current exercise habits: Home exercise routine includes walking 30 hrs per day.  Dietary issues discussed: Eats fatty and fried foods, does not drink milk or many calcium-containing products, eats some vegetables but no fruit. Discussed taking a multivitamin to replace some of the things that is missing. Patient never eats breakfast. Drinks soda and juice drinks.   Cardiac risk factors: advanced age (older than 17 for men, 58 for women), family history of premature cardiovascular  disease and smoking/ tobacco exposure.  Depression Screen (Note: if answer to either of the following is "Yes", a more complete depression screening is indicated)  Q1: Over the past two weeks, have you felt down, depressed or hopeless? No  Q2: Over the past two weeks, have you felt little interest or pleasure in doing things? No  Have you lost interest or pleasure in daily life? No  Do you often feel hopeless? No  Do you cry easily over simple problems? No  Activities of Daily Living In your present state of health, do you have any difficulty performing the following activities?:  Driving? No Managing money?  yes Feeding yourself? No Getting from bed to chair? No Climbing a flight of stairs? No Preparing food and eating?: wife prepares Bathing or showering? No Getting dressed: No Getting to the toilet? No Using the toilet:No Moving around from place to place: No In the past year have you fallen or had a near fall?:No   Are you sexually active?  Yes  Do you have more than one partner?  No  Hearing Difficulties: No Do you often ask people to speak up or repeat themselves? No Do you experience ringing or noises in your ears? No Do you have difficulty understanding soft or whispered voices? No   Do you feel that you have a problem with memory? No  Do you often misplace items? No  Do you feel safe at home?  Yes  Cognitive Testing  Alert? Yes  Normal Appearance?Yes  Oriented to person? Yes  Place? Yes   Time? Yes  Recall of three objects?  Yes  Can perform simple calculations? Yes  Displays appropriate judgment?Yes  Can read the correct time from a watch face?Yes   Advanced Directives have been discussed with the patient? Yes   List the Names of Other Physician/Practitioners you currently use: 1.    Indicate any recent Medical Services you may have received from other than [facility] providers in the past year (date may be approximate).  Immunization History  Administered  Date(s) Administered  . Influenza,inj,Quad PF,36+ Mos 02/28/2013  . Influenza-Unspecified 01/21/2014  . Pneumococcal Conjugate-13 03/15/2014  . Tdap 05/31/2014  . Zoster 01/21/2014    Screening Tests Health Maintenance  Topic Date Due  . INFLUENZA VACCINE  02/05/2015 (Originally 11/29/2014)  . HIV Screening  01/06/2016 (Originally 10/21/1966)  . COLONOSCOPY  02/13/2015  . TETANUS/TDAP  05/31/2024  . ZOSTAVAX  Completed  . Hepatitis C Screening  Completed    All answers were reviewed with the patient and necessary referrals were made:  Worthy Rancher, MD   01/06/2015   History reviewed: allergies, current medications, past family history, past medical history, past social history, past surgical history and problem list  Review of Systems Pertinent items are noted in HPI.    Objective:     Vision by Snellen chart: right eye:he sees eye doctor, left XQJ:JHER eye doctor Blood pressure 125/72, pulse 69, temperature 98.3 F (36.8 C), temperature source Oral, height 6' (1.829 m), weight 185 lb 6.4 oz (84.097 kg). Body mass index is 25.14 kg/(m^2).  BP 125/72 mmHg  Pulse 69  Temp(Src) 98.3 F (36.8 C) (Oral)  Ht 6' (1.829 m)  Wt 185 lb 6.4 oz (84.097 kg)  BMI 25.14 kg/m2  General Appearance:    Alert, cooperative, no distress, appears stated age  Head:    Normocephalic, without obvious abnormality, atraumatic  Eyes:    PERRL, conjunctiva/corneas clear, EOM's intact, fundi    benign, both eyes       Ears:    Normal TM's and external ear canals, both ears  Nose:   Nares  normal, septum midline, mucosa normal, no drainage    or sinus tenderness  Throat:   Lips, mucosa, and tongue normal; teeth and gums normal  Neck:   Supple, symmetrical, trachea midline, no adenopathy;       thyroid:  No enlargement/tenderness/nodules; no carotid   bruit or JVD  Back:     Symmetric, no curvature, ROM normal, no CVA tenderness  Lungs:     Clear to auscultation bilaterally, respirations  unlabored  Chest wall:    No tenderness or deformity  Heart:    Regular rate and rhythm, S1 and S2 normal, no murmur, rub   or gallop  Abdomen:     Soft, non-tender, bowel sounds active all four quadrants,    no masses, no organomegaly  Genitalia:    Normal male without lesion, discharge or tenderness  Rectal:    Normal tone, normal prostate, no masses or tenderness;   guaiac negative stool  Extremities:   Extremities normal, atraumatic, no cyanosis or edema  Pulses:   2+ and symmetric all extremities  Skin:   Skin color, texture, turgor normal, no rashes or lesions  Lymph nodes:   Cervical, supraclavicular, and axillary nodes normal  Neurologic:   CNII-XII intact. Normal strength, sensation and reflexes      throughout       Assessment:     Problem List Items Addressed This Visit      Respiratory   COPD, mild    Patient has had mild COPD for some time because of smoking, he does not plan on quitting smoking anytime soon. He cannot usually afford his inhalers or rescue inhalers. We had coupon for a pro-air so gave him a prescription for that in the coupon.      Relevant Medications   guaifenesin (MUCUS RELIEF) 400 MG TABS tablet   Albuterol Sulfate (PROAIR RESPICLICK) 536 (90 BASE) MCG/ACT AEPB    Other Visit Diagnoses    Medicare annual wellness visit, initial    -  Primary    Patient here for annual Medicare wellness visit and screening labs.    Screening for diabetes mellitus        Relevant Orders    Basic Metabolic Panel with GFR    POCT glycosylated hemoglobin (Hb A1C)    Screening for lipoid disorders        Relevant Orders    Lipid panel    Prostate cancer screening        Relevant Orders    PSA    Routine history and physical examination of adult               Plan:     During the course of the visit the patient was educated and counseled about appropriate screening and preventive services including:    Pneumococcal vaccine   Influenza vaccine  Td  vaccine  Colorectal cancer screening  Diabetes screening  Nutrition counseling   Smoking cessation counseling  Diet review for nutrition referral? Yes ____  Not Indicated ____   Patient Instructions (the written plan) was given to the patient.  Medicare Attestation I have personally reviewed: The patient's medical and social history Their use of alcohol, tobacco or illicit drugs Their current medications and supplements The patient's functional ability including ADLs,fall risks, home safety risks, cognitive, and hearing and visual impairment Diet and physical activities Evidence for depression or mood disorders  The patient's weight, height, BMI, and visual acuity have been recorded in the chart.  I have made  referrals, counseling, and provided education to the patient based on review of the above and I have provided the patient with a written personalized care plan for preventive services.     Worthy Rancher, MD   01/06/2015

## 2015-01-07 ENCOUNTER — Other Ambulatory Visit: Payer: Medicare PPO

## 2015-01-07 LAB — POCT GLYCOSYLATED HEMOGLOBIN (HGB A1C): Hemoglobin A1C: 5.6

## 2015-01-07 NOTE — Addendum Note (Signed)
Addended by: Earlene Plater on: 01/07/2015 08:55 AM   Modules accepted: Orders

## 2015-01-08 LAB — LIPID PANEL
CHOL/HDL RATIO: 4 ratio (ref 0.0–5.0)
Cholesterol, Total: 176 mg/dL (ref 100–199)
HDL: 44 mg/dL (ref >39)
LDL CALC: 116 mg/dL — AB (ref 0–99)
Triglycerides: 80 mg/dL (ref 0–149)
VLDL CHOLESTEROL CAL: 16 mg/dL (ref 5–40)

## 2015-01-08 LAB — BMP8+EGFR
BUN / CREAT RATIO: 24 — AB (ref 10–22)
BUN: 22 mg/dL (ref 8–27)
CHLORIDE: 103 mmol/L (ref 97–108)
CO2: 24 mmol/L (ref 18–29)
Calcium: 9.3 mg/dL (ref 8.6–10.2)
Creatinine, Ser: 0.91 mg/dL (ref 0.76–1.27)
GFR calc non Af Amer: 89 mL/min/{1.73_m2} (ref >59)
GFR, EST AFRICAN AMERICAN: 103 mL/min/{1.73_m2} (ref >59)
GLUCOSE: 92 mg/dL (ref 65–99)
Potassium: 4.3 mmol/L (ref 3.5–5.2)
SODIUM: 143 mmol/L (ref 134–144)

## 2015-01-08 LAB — PSA: PROSTATE SPECIFIC AG, SERUM: 1 ng/mL (ref 0.0–4.0)

## 2015-02-18 ENCOUNTER — Encounter: Payer: Self-pay | Admitting: Gastroenterology

## 2015-09-02 ENCOUNTER — Encounter: Payer: Self-pay | Admitting: Gastroenterology

## 2015-09-29 ENCOUNTER — Other Ambulatory Visit: Payer: Self-pay | Admitting: Family

## 2016-05-04 ENCOUNTER — Other Ambulatory Visit: Payer: Self-pay | Admitting: Family Medicine

## 2016-05-13 ENCOUNTER — Other Ambulatory Visit: Payer: Self-pay | Admitting: Family Medicine

## 2016-05-28 ENCOUNTER — Other Ambulatory Visit: Payer: Self-pay | Admitting: Neurological Surgery

## 2016-05-28 DIAGNOSIS — M545 Low back pain: Principal | ICD-10-CM

## 2016-05-28 DIAGNOSIS — G8929 Other chronic pain: Secondary | ICD-10-CM

## 2016-05-30 ENCOUNTER — Ambulatory Visit
Admission: RE | Admit: 2016-05-30 | Discharge: 2016-05-30 | Disposition: A | Discharge status: Home / Self Care | Payer: Medicare PPO | Source: Ambulatory Visit | Attending: Neurological Surgery | Admitting: Neurological Surgery

## 2016-05-30 DIAGNOSIS — G8929 Other chronic pain: Secondary | ICD-10-CM

## 2016-05-30 DIAGNOSIS — M545 Low back pain: Principal | ICD-10-CM

## 2016-05-30 MED ORDER — ONDANSETRON HCL 4 MG/2ML IJ SOLN: 4.0000 mg | Freq: Four times a day (QID) | INTRAMUSCULAR | Status: DC | PRN

## 2016-05-30 MED ORDER — DIAZEPAM 5 MG PO TABS
10.0000 mg | ORAL_TABLET | Freq: Once | ORAL | Status: AC
  Administered 2016-05-30: 10 mg via ORAL

## 2016-05-30 MED ORDER — IOPAMIDOL (ISOVUE-M 200) INJECTION 41%
20.0000 mL | Freq: Once | INTRAMUSCULAR | Status: AC
  Administered 2016-05-30: 20 mL via INTRATHECAL

## 2016-05-30 NOTE — Discharge Instructions (Signed)

## 2016-06-22 DIAGNOSIS — M5126 Other intervertebral disc displacement, lumbar region: Secondary | ICD-10-CM | POA: Insufficient documentation

## 2016-09-02 DIAGNOSIS — R200 Anesthesia of skin: Secondary | ICD-10-CM | POA: Diagnosis not present

## 2016-09-02 DIAGNOSIS — I6789 Other cerebrovascular disease: Secondary | ICD-10-CM | POA: Diagnosis not present

## 2016-10-08 DIAGNOSIS — M542 Cervicalgia: Secondary | ICD-10-CM | POA: Diagnosis not present

## 2016-10-08 DIAGNOSIS — R030 Elevated blood-pressure reading, without diagnosis of hypertension: Secondary | ICD-10-CM | POA: Diagnosis not present

## 2016-10-08 DIAGNOSIS — M47816 Spondylosis without myelopathy or radiculopathy, lumbar region: Secondary | ICD-10-CM | POA: Diagnosis not present

## 2016-10-09 DIAGNOSIS — M25561 Pain in right knee: Secondary | ICD-10-CM | POA: Diagnosis not present

## 2016-10-09 DIAGNOSIS — Z720 Tobacco use: Secondary | ICD-10-CM | POA: Diagnosis not present

## 2016-10-09 DIAGNOSIS — M25562 Pain in left knee: Secondary | ICD-10-CM | POA: Diagnosis not present

## 2016-10-09 DIAGNOSIS — M170 Bilateral primary osteoarthritis of knee: Secondary | ICD-10-CM | POA: Diagnosis not present

## 2016-10-16 DIAGNOSIS — M25561 Pain in right knee: Secondary | ICD-10-CM | POA: Diagnosis not present

## 2016-10-16 DIAGNOSIS — M25562 Pain in left knee: Secondary | ICD-10-CM | POA: Diagnosis not present

## 2016-10-16 DIAGNOSIS — M170 Bilateral primary osteoarthritis of knee: Secondary | ICD-10-CM | POA: Diagnosis not present

## 2016-10-23 DIAGNOSIS — M170 Bilateral primary osteoarthritis of knee: Secondary | ICD-10-CM | POA: Diagnosis not present

## 2016-10-23 DIAGNOSIS — M25561 Pain in right knee: Secondary | ICD-10-CM | POA: Diagnosis not present

## 2016-10-23 DIAGNOSIS — M25562 Pain in left knee: Secondary | ICD-10-CM | POA: Diagnosis not present

## 2016-11-08 ENCOUNTER — Ambulatory Visit (INDEPENDENT_AMBULATORY_CARE_PROVIDER_SITE_OTHER): Payer: Medicare Other | Admitting: Pediatrics

## 2016-11-08 ENCOUNTER — Ambulatory Visit (INDEPENDENT_AMBULATORY_CARE_PROVIDER_SITE_OTHER): Payer: Medicare Other

## 2016-11-08 ENCOUNTER — Encounter: Payer: Self-pay | Admitting: Pediatrics

## 2016-11-08 VITALS — BP 114/71 | HR 71 | Temp 97.1°F | Resp 22 | Ht 72.0 in | Wt 179.4 lb

## 2016-11-08 DIAGNOSIS — J441 Chronic obstructive pulmonary disease with (acute) exacerbation: Secondary | ICD-10-CM | POA: Diagnosis not present

## 2016-11-08 DIAGNOSIS — R0602 Shortness of breath: Secondary | ICD-10-CM

## 2016-11-08 DIAGNOSIS — R05 Cough: Secondary | ICD-10-CM

## 2016-11-08 DIAGNOSIS — R071 Chest pain on breathing: Secondary | ICD-10-CM | POA: Diagnosis not present

## 2016-11-08 DIAGNOSIS — R059 Cough, unspecified: Secondary | ICD-10-CM

## 2016-11-08 MED ORDER — ALBUTEROL SULFATE 108 (90 BASE) MCG/ACT IN AEPB: 2.0000 | INHALATION_SPRAY | RESPIRATORY_TRACT | 1 refills | Status: DC | PRN

## 2016-11-08 MED ORDER — PREDNISONE 20 MG PO TABS: ORAL_TABLET | ORAL | 0 refills | Status: DC

## 2016-11-08 MED ORDER — AZITHROMYCIN 250 MG PO TABS: ORAL_TABLET | ORAL | 0 refills | Status: DC

## 2016-11-08 NOTE — Progress Notes (Signed)
  Subjective:   Patient ID: Wesley Schultz, male    DOB: 12/30/1950, 66 y.o.   MRN: [id number] CC: Shortness of Breath; Cough; and Chest Pain with deep breath and cough  HPI: Wesley Schultz is a 66 y.o. male presenting for Shortness of Breath; Cough; and Chest Pain with deep breath and cough  Started last night When he coughs R side of chest hurts Sometimes has productive cough Feeling more SOB since cough started No fevers Some nasal congestion Appetite has been ok Ran out of albuterol at home  Smokes at least a ppd Not able to quit now he says   Relevant past medical, surgical, family and social history reviewed. Allergies and medications reviewed and updated. History  Smoking Status  . Current Every Day Smoker  . Packs/day: 1.00  . Years: 40.00  . Types: Cigarettes  Smokeless Tobacco  . Never Used   ROS: Per HPI   Objective:    BP 114/71   Pulse 71   Temp (!) 97.1 F (36.2 C) (Oral)   Resp (!) 22   Ht 6' (1.829 m)   Wt 179 lb 6.4 oz (81.4 kg)   SpO2 95%   BMI 24.33 kg/m   Wt Readings from Last 3 Encounters:  11/08/16 179 lb 6.4 oz (81.4 kg)  01/06/15 185 lb 6.4 oz (84.1 kg)  11/26/14 187 lb (84.8 kg)    Gen: NAD, alert, cooperative with exam, NCAT EYES: EOMI, no conjunctival injection, or no icterus ENT:  TMs pearly gray b/l, OP without erythema LYMPH: no cervical LAD CV: NRRR, normal S1/S2, no murmur, distal pulses 2+ b/l Resp: wheezing with exhalation b/l, no crackles, comfortable WOB Ext: No edema, warm Neuro: Alert and oriented  Assessment & Plan:  Wesley Schultz was seen today for shortness of breath, cough and chest pain with deep breath and cough.  Diagnoses and all orders for this visit:  Cough CXR without infiltrate Treat for COPD exacerbation as below -     DG Chest 2 View; Future  Shortness of breath -     DG Chest 2 View; Future  Chest pain on breathing -     DG Chest 2 View; Future  COPD exacerbation (HCC) -     predniSONE (DELTASONE) 20 MG  tablet; 2 po at same time daily for 3 days. -     azithromycin (ZITHROMAX) 250 MG tablet; Take 2 the first day and then one each day after. -     Albuterol Sulfate (PROAIR RESPICLICK) 056 (90 Base) MCG/ACT AEPB; Inhale 2 puffs into the lungs every 4 (four) hours as needed.   Follow up plan: Return in about 4 weeks (around 12/06/2016) for med follow up. Assunta Found, MD [place]

## 2016-11-27 DIAGNOSIS — M47816 Spondylosis without myelopathy or radiculopathy, lumbar region: Secondary | ICD-10-CM | POA: Diagnosis not present

## 2016-11-27 DIAGNOSIS — M47817 Spondylosis without myelopathy or radiculopathy, lumbosacral region: Secondary | ICD-10-CM | POA: Diagnosis not present

## 2016-11-29 DIAGNOSIS — M47817 Spondylosis without myelopathy or radiculopathy, lumbosacral region: Secondary | ICD-10-CM | POA: Diagnosis not present

## 2016-12-20 DIAGNOSIS — M47817 Spondylosis without myelopathy or radiculopathy, lumbosacral region: Secondary | ICD-10-CM | POA: Diagnosis not present

## 2016-12-20 DIAGNOSIS — M47816 Spondylosis without myelopathy or radiculopathy, lumbar region: Secondary | ICD-10-CM | POA: Diagnosis not present

## 2017-01-16 DIAGNOSIS — M47817 Spondylosis without myelopathy or radiculopathy, lumbosacral region: Secondary | ICD-10-CM | POA: Diagnosis not present

## 2017-01-16 DIAGNOSIS — R030 Elevated blood-pressure reading, without diagnosis of hypertension: Secondary | ICD-10-CM | POA: Diagnosis not present

## 2017-01-16 DIAGNOSIS — M47816 Spondylosis without myelopathy or radiculopathy, lumbar region: Secondary | ICD-10-CM | POA: Diagnosis not present

## 2017-02-05 DIAGNOSIS — M170 Bilateral primary osteoarthritis of knee: Secondary | ICD-10-CM | POA: Diagnosis not present

## 2017-02-05 DIAGNOSIS — M25562 Pain in left knee: Secondary | ICD-10-CM | POA: Diagnosis not present

## 2017-02-05 DIAGNOSIS — M25561 Pain in right knee: Secondary | ICD-10-CM | POA: Diagnosis not present

## 2017-02-12 DIAGNOSIS — M25562 Pain in left knee: Secondary | ICD-10-CM | POA: Diagnosis not present

## 2017-02-12 DIAGNOSIS — M170 Bilateral primary osteoarthritis of knee: Secondary | ICD-10-CM | POA: Diagnosis not present

## 2017-02-12 DIAGNOSIS — M25561 Pain in right knee: Secondary | ICD-10-CM | POA: Diagnosis not present

## 2017-02-17 DIAGNOSIS — Z23 Encounter for immunization: Secondary | ICD-10-CM | POA: Diagnosis not present

## 2017-02-19 DIAGNOSIS — M25561 Pain in right knee: Secondary | ICD-10-CM | POA: Diagnosis not present

## 2017-02-19 DIAGNOSIS — M170 Bilateral primary osteoarthritis of knee: Secondary | ICD-10-CM | POA: Diagnosis not present

## 2017-02-19 DIAGNOSIS — M25562 Pain in left knee: Secondary | ICD-10-CM | POA: Diagnosis not present

## 2017-02-26 DIAGNOSIS — M25561 Pain in right knee: Secondary | ICD-10-CM | POA: Diagnosis not present

## 2017-02-26 DIAGNOSIS — M170 Bilateral primary osteoarthritis of knee: Secondary | ICD-10-CM | POA: Diagnosis not present

## 2017-02-26 DIAGNOSIS — M25562 Pain in left knee: Secondary | ICD-10-CM | POA: Diagnosis not present

## 2017-05-29 DIAGNOSIS — M25562 Pain in left knee: Secondary | ICD-10-CM | POA: Diagnosis not present

## 2017-05-29 DIAGNOSIS — M25561 Pain in right knee: Secondary | ICD-10-CM | POA: Diagnosis not present

## 2017-05-29 DIAGNOSIS — M170 Bilateral primary osteoarthritis of knee: Secondary | ICD-10-CM | POA: Diagnosis not present

## 2017-06-06 DIAGNOSIS — M25561 Pain in right knee: Secondary | ICD-10-CM | POA: Diagnosis not present

## 2017-06-06 DIAGNOSIS — M25562 Pain in left knee: Secondary | ICD-10-CM | POA: Diagnosis not present

## 2017-06-06 DIAGNOSIS — M170 Bilateral primary osteoarthritis of knee: Secondary | ICD-10-CM | POA: Diagnosis not present

## 2017-06-13 DIAGNOSIS — M25562 Pain in left knee: Secondary | ICD-10-CM | POA: Diagnosis not present

## 2017-06-13 DIAGNOSIS — M25561 Pain in right knee: Secondary | ICD-10-CM | POA: Diagnosis not present

## 2017-06-13 DIAGNOSIS — M170 Bilateral primary osteoarthritis of knee: Secondary | ICD-10-CM | POA: Diagnosis not present

## 2017-06-30 DIAGNOSIS — R110 Nausea: Secondary | ICD-10-CM | POA: Diagnosis not present

## 2017-06-30 DIAGNOSIS — K0889 Other specified disorders of teeth and supporting structures: Secondary | ICD-10-CM | POA: Diagnosis not present

## 2017-06-30 DIAGNOSIS — G44209 Tension-type headache, unspecified, not intractable: Secondary | ICD-10-CM | POA: Diagnosis not present

## 2017-09-16 DIAGNOSIS — M21162 Varus deformity, not elsewhere classified, left knee: Secondary | ICD-10-CM | POA: Diagnosis not present

## 2017-09-16 DIAGNOSIS — M25662 Stiffness of left knee, not elsewhere classified: Secondary | ICD-10-CM | POA: Diagnosis not present

## 2017-09-16 DIAGNOSIS — M25561 Pain in right knee: Secondary | ICD-10-CM | POA: Diagnosis not present

## 2017-09-16 DIAGNOSIS — F17210 Nicotine dependence, cigarettes, uncomplicated: Secondary | ICD-10-CM | POA: Diagnosis not present

## 2017-09-16 DIAGNOSIS — R269 Unspecified abnormalities of gait and mobility: Secondary | ICD-10-CM | POA: Diagnosis not present

## 2017-09-16 DIAGNOSIS — M25661 Stiffness of right knee, not elsewhere classified: Secondary | ICD-10-CM | POA: Diagnosis not present

## 2017-09-16 DIAGNOSIS — M170 Bilateral primary osteoarthritis of knee: Secondary | ICD-10-CM | POA: Diagnosis not present

## 2017-09-16 DIAGNOSIS — M21161 Varus deformity, not elsewhere classified, right knee: Secondary | ICD-10-CM | POA: Diagnosis not present

## 2017-09-16 DIAGNOSIS — M25562 Pain in left knee: Secondary | ICD-10-CM | POA: Diagnosis not present

## 2017-09-19 DIAGNOSIS — Z23 Encounter for immunization: Secondary | ICD-10-CM | POA: Diagnosis not present

## 2017-09-24 DIAGNOSIS — M25562 Pain in left knee: Secondary | ICD-10-CM | POA: Diagnosis not present

## 2017-09-24 DIAGNOSIS — M25561 Pain in right knee: Secondary | ICD-10-CM | POA: Diagnosis not present

## 2017-09-24 DIAGNOSIS — M170 Bilateral primary osteoarthritis of knee: Secondary | ICD-10-CM | POA: Diagnosis not present

## 2017-10-01 DIAGNOSIS — M25562 Pain in left knee: Secondary | ICD-10-CM | POA: Diagnosis not present

## 2017-10-01 DIAGNOSIS — M25561 Pain in right knee: Secondary | ICD-10-CM | POA: Diagnosis not present

## 2017-10-01 DIAGNOSIS — M170 Bilateral primary osteoarthritis of knee: Secondary | ICD-10-CM | POA: Diagnosis not present

## 2017-10-08 DIAGNOSIS — M25561 Pain in right knee: Secondary | ICD-10-CM | POA: Diagnosis not present

## 2017-10-08 DIAGNOSIS — M25562 Pain in left knee: Secondary | ICD-10-CM | POA: Diagnosis not present

## 2017-10-08 DIAGNOSIS — M170 Bilateral primary osteoarthritis of knee: Secondary | ICD-10-CM | POA: Diagnosis not present

## 2017-10-15 DIAGNOSIS — M25562 Pain in left knee: Secondary | ICD-10-CM | POA: Diagnosis not present

## 2017-10-15 DIAGNOSIS — M25561 Pain in right knee: Secondary | ICD-10-CM | POA: Diagnosis not present

## 2017-10-15 DIAGNOSIS — M170 Bilateral primary osteoarthritis of knee: Secondary | ICD-10-CM | POA: Diagnosis not present

## 2017-11-01 ENCOUNTER — Ambulatory Visit (INDEPENDENT_AMBULATORY_CARE_PROVIDER_SITE_OTHER): Payer: Medicare Other | Admitting: Family

## 2017-11-01 ENCOUNTER — Encounter: Payer: Self-pay | Admitting: Family

## 2017-11-01 VITALS — BP 124/70 | HR 66 | Temp 97.3°F | Ht 72.0 in | Wt 180.0 lb

## 2017-11-01 DIAGNOSIS — K047 Periapical abscess without sinus: Secondary | ICD-10-CM | POA: Diagnosis not present

## 2017-11-01 MED ORDER — AMOXICILLIN-POT CLAVULANATE 875-125 MG PO TABS: 1.0000 | ORAL_TABLET | Freq: Two times a day (BID) | ORAL | 0 refills | Status: DC

## 2017-11-01 NOTE — Patient Instructions (Signed)
Dental Abscess A dental abscess is a collection of pus in or around a tooth. What are the causes? This condition is caused by a bacterial infection around the root of the tooth that involves the inner part of the tooth (pulp). It may result from:  Severe tooth decay.  Trauma to the tooth that allows bacteria to enter into the pulp, such as a broken or chipped tooth.  Severe gum disease around a tooth.  What are the signs or symptoms? Symptoms of this condition include:  Severe pain in and around the infected tooth.  Swelling and redness around the infected tooth, in the mouth, or in the face.  Tenderness.  Pus drainage.  Bad breath.  Bitter taste in the mouth.  Difficulty swallowing.  Difficulty opening the mouth.  Nausea.  Vomiting.  Chills.  Swollen neck glands.  Fever.  How is this diagnosed? This condition is diagnosed with examination of the infected tooth. During the exam, your dentist may tap on the infected tooth. Your dentist will also ask about your medical and dental history and may order X-rays. How is this treated? This condition is treated by eliminating the infection. This may be done with:  Antibiotic medicine.  A root canal. This may be performed to save the tooth.  Pulling (extracting) the tooth. This may also involve draining the abscess. This is done if the tooth cannot be saved.  Follow these instructions at home:  Take medicines only as directed by your dentist.  If you were prescribed antibiotic medicine, finish all of it even if you start to feel better.  Rinse your mouth (gargle) often with salt water to relieve pain or swelling.  Do not drive or operate heavy machinery while taking pain medicine.  Do not apply heat to the outside of your mouth.  Keep all follow-up visits as directed by your dentist. This is important. Contact a health care provider if:  Your pain is worse and is not helped by medicine. Get help right away  if:  You have a fever or chills.  Your symptoms suddenly get worse.  You have a very bad headache.  You have problems breathing or swallowing.  You have trouble opening your mouth.  You have swelling in your neck or around your eye. This information is not intended to replace advice given to you by your health care provider. Make sure you discuss any questions you have with your health care provider. Document Released: 04/16/2005 Document Revised: 08/25/2015 Document Reviewed: 04/13/2014 Elsevier Interactive Patient Education  2018 [employer]  

## 2017-11-01 NOTE — Progress Notes (Signed)
   Subjective:    Patient ID: Wesley Schultz, male    DOB: 1950/07/24, 67 y.o.   MRN: [id number]  Chief Complaint  Patient presents with  . Dental Pain    Right    HPI Pt presents to the office with dental pain of left lower jaw that started three days ago. He reports the pain is becoming worse. He has taken Aleve and goodie powder with mild relief.   States he has not been to the dentist in "years", but plans on making an appointment.    Review of Systems  HENT: Positive for dental problem.   All other systems reviewed and are negative.      Objective:   Physical Exam  Constitutional: He is oriented to person, place, and time. He appears well-developed and well-nourished. No distress.  HENT:  Head: Normocephalic.  Mouth/Throat: Dental abscesses and dental caries present.    Eyes: Pupils are equal, round, and reactive to light. Right eye exhibits no discharge. Left eye exhibits no discharge.  Neck: Normal range of motion. Neck supple. No thyromegaly present.  Cardiovascular: Normal rate, regular rhythm, normal heart sounds and intact distal pulses.  No murmur heard. Pulmonary/Chest: Effort normal and breath sounds normal. No respiratory distress. He has no wheezes.  Abdominal: Soft. Bowel sounds are normal. He exhibits no distension. There is no tenderness.  Musculoskeletal: Normal range of motion. He exhibits no edema or tenderness.  Neurological: He is alert and oriented to person, place, and time. He has normal reflexes. No cranial nerve deficit.  Skin: Skin is warm and dry. No rash noted. No erythema.  Psychiatric: He has a normal mood and affect. His behavior is normal. Judgment and thought content normal.  Vitals reviewed.   BP 124/70   Pulse 66   Temp (!) 97.3 F (36.3 C) (Oral)   Ht 6' (1.829 m)   Wt 180 lb (81.6 kg)   BMI 24.41 kg/m        Assessment & Plan:  Wesley Schultz was seen today for dental pain.  Diagnoses and all orders for this visit:  Dental  abscess -     amoxicillin-clavulanate (AUGMENTIN) 875-125 MG tablet; Take 1 tablet by mouth 2 (two) times daily.   Follow up with dentists  Start Augmentin  Gargle with warm water after meals RTO if symptoms worsen or do not improve   Evelina Dun, FNP

## 2017-11-25 DIAGNOSIS — M25561 Pain in right knee: Secondary | ICD-10-CM | POA: Diagnosis not present

## 2017-11-25 DIAGNOSIS — M170 Bilateral primary osteoarthritis of knee: Secondary | ICD-10-CM | POA: Diagnosis not present

## 2017-11-25 DIAGNOSIS — M25562 Pain in left knee: Secondary | ICD-10-CM | POA: Diagnosis not present

## 2018-01-22 DIAGNOSIS — M25561 Pain in right knee: Secondary | ICD-10-CM | POA: Diagnosis not present

## 2018-01-22 DIAGNOSIS — M170 Bilateral primary osteoarthritis of knee: Secondary | ICD-10-CM | POA: Diagnosis not present

## 2018-01-22 DIAGNOSIS — M25562 Pain in left knee: Secondary | ICD-10-CM | POA: Diagnosis not present

## 2018-01-29 DIAGNOSIS — M25562 Pain in left knee: Secondary | ICD-10-CM | POA: Diagnosis not present

## 2018-01-29 DIAGNOSIS — M25561 Pain in right knee: Secondary | ICD-10-CM | POA: Diagnosis not present

## 2018-01-29 DIAGNOSIS — M170 Bilateral primary osteoarthritis of knee: Secondary | ICD-10-CM | POA: Diagnosis not present

## 2018-02-04 DIAGNOSIS — M25562 Pain in left knee: Secondary | ICD-10-CM | POA: Diagnosis not present

## 2018-02-04 DIAGNOSIS — M170 Bilateral primary osteoarthritis of knee: Secondary | ICD-10-CM | POA: Diagnosis not present

## 2018-02-04 DIAGNOSIS — M25561 Pain in right knee: Secondary | ICD-10-CM | POA: Diagnosis not present

## 2018-02-10 DIAGNOSIS — Z23 Encounter for immunization: Secondary | ICD-10-CM | POA: Diagnosis not present

## 2018-02-12 DIAGNOSIS — M170 Bilateral primary osteoarthritis of knee: Secondary | ICD-10-CM | POA: Diagnosis not present

## 2018-02-12 DIAGNOSIS — M25561 Pain in right knee: Secondary | ICD-10-CM | POA: Diagnosis not present

## 2018-02-12 DIAGNOSIS — M25562 Pain in left knee: Secondary | ICD-10-CM | POA: Diagnosis not present

## 2018-02-19 DIAGNOSIS — M25561 Pain in right knee: Secondary | ICD-10-CM | POA: Diagnosis not present

## 2018-02-19 DIAGNOSIS — M25562 Pain in left knee: Secondary | ICD-10-CM | POA: Diagnosis not present

## 2018-02-19 DIAGNOSIS — M170 Bilateral primary osteoarthritis of knee: Secondary | ICD-10-CM | POA: Diagnosis not present

## 2018-04-21 DIAGNOSIS — J209 Acute bronchitis, unspecified: Secondary | ICD-10-CM | POA: Diagnosis not present

## 2018-04-21 DIAGNOSIS — R05 Cough: Secondary | ICD-10-CM | POA: Diagnosis not present

## 2018-05-13 DIAGNOSIS — M25562 Pain in left knee: Secondary | ICD-10-CM | POA: Diagnosis not present

## 2018-05-13 DIAGNOSIS — M170 Bilateral primary osteoarthritis of knee: Secondary | ICD-10-CM | POA: Diagnosis not present

## 2018-05-13 DIAGNOSIS — M25561 Pain in right knee: Secondary | ICD-10-CM | POA: Diagnosis not present

## 2018-05-21 DIAGNOSIS — M25562 Pain in left knee: Secondary | ICD-10-CM | POA: Diagnosis not present

## 2018-05-21 DIAGNOSIS — M25561 Pain in right knee: Secondary | ICD-10-CM | POA: Diagnosis not present

## 2018-05-21 DIAGNOSIS — M170 Bilateral primary osteoarthritis of knee: Secondary | ICD-10-CM | POA: Diagnosis not present

## 2018-05-26 DIAGNOSIS — M170 Bilateral primary osteoarthritis of knee: Secondary | ICD-10-CM | POA: Diagnosis not present

## 2018-05-26 DIAGNOSIS — M25561 Pain in right knee: Secondary | ICD-10-CM | POA: Diagnosis not present

## 2018-05-26 DIAGNOSIS — M25562 Pain in left knee: Secondary | ICD-10-CM | POA: Diagnosis not present

## 2018-08-27 DIAGNOSIS — M25562 Pain in left knee: Secondary | ICD-10-CM | POA: Diagnosis not present

## 2018-08-27 DIAGNOSIS — M25561 Pain in right knee: Secondary | ICD-10-CM | POA: Diagnosis not present

## 2018-08-27 DIAGNOSIS — M170 Bilateral primary osteoarthritis of knee: Secondary | ICD-10-CM | POA: Diagnosis not present

## 2018-09-03 DIAGNOSIS — M25562 Pain in left knee: Secondary | ICD-10-CM | POA: Diagnosis not present

## 2018-09-03 DIAGNOSIS — M170 Bilateral primary osteoarthritis of knee: Secondary | ICD-10-CM | POA: Diagnosis not present

## 2018-09-03 DIAGNOSIS — M25561 Pain in right knee: Secondary | ICD-10-CM | POA: Diagnosis not present

## 2018-09-10 DIAGNOSIS — M25562 Pain in left knee: Secondary | ICD-10-CM | POA: Diagnosis not present

## 2018-09-10 DIAGNOSIS — M170 Bilateral primary osteoarthritis of knee: Secondary | ICD-10-CM | POA: Diagnosis not present

## 2018-09-10 DIAGNOSIS — M25561 Pain in right knee: Secondary | ICD-10-CM | POA: Diagnosis not present

## 2018-09-17 DIAGNOSIS — M170 Bilateral primary osteoarthritis of knee: Secondary | ICD-10-CM | POA: Diagnosis not present

## 2018-09-17 DIAGNOSIS — M25562 Pain in left knee: Secondary | ICD-10-CM | POA: Diagnosis not present

## 2018-09-17 DIAGNOSIS — M25561 Pain in right knee: Secondary | ICD-10-CM | POA: Diagnosis not present

## 2018-11-20 DIAGNOSIS — M25461 Effusion, right knee: Secondary | ICD-10-CM | POA: Diagnosis not present

## 2018-11-20 DIAGNOSIS — M25462 Effusion, left knee: Secondary | ICD-10-CM | POA: Diagnosis not present

## 2018-11-20 DIAGNOSIS — M170 Bilateral primary osteoarthritis of knee: Secondary | ICD-10-CM | POA: Diagnosis not present

## 2018-11-20 DIAGNOSIS — M25561 Pain in right knee: Secondary | ICD-10-CM | POA: Diagnosis not present

## 2018-11-20 DIAGNOSIS — M25562 Pain in left knee: Secondary | ICD-10-CM | POA: Diagnosis not present

## 2019-02-04 DIAGNOSIS — M25562 Pain in left knee: Secondary | ICD-10-CM | POA: Diagnosis not present

## 2019-02-04 DIAGNOSIS — M170 Bilateral primary osteoarthritis of knee: Secondary | ICD-10-CM | POA: Diagnosis not present

## 2019-02-04 DIAGNOSIS — M25462 Effusion, left knee: Secondary | ICD-10-CM | POA: Diagnosis not present

## 2019-02-04 DIAGNOSIS — M25561 Pain in right knee: Secondary | ICD-10-CM | POA: Diagnosis not present

## 2019-02-11 DIAGNOSIS — Z23 Encounter for immunization: Secondary | ICD-10-CM | POA: Diagnosis not present

## 2019-02-25 DIAGNOSIS — M25462 Effusion, left knee: Secondary | ICD-10-CM | POA: Diagnosis not present

## 2019-02-25 DIAGNOSIS — M25562 Pain in left knee: Secondary | ICD-10-CM | POA: Diagnosis not present

## 2019-02-25 DIAGNOSIS — M25561 Pain in right knee: Secondary | ICD-10-CM | POA: Diagnosis not present

## 2019-02-25 DIAGNOSIS — M170 Bilateral primary osteoarthritis of knee: Secondary | ICD-10-CM | POA: Diagnosis not present

## 2019-05-14 IMAGING — DX DG CHEST 2V
2 series · 2 of 2 positions shown · non-contrast
Comparison: August 25, 2016

CLINICAL DATA: Shortness of breath and chest pain

EXAM:
CHEST  2 VIEW

[chest pa]
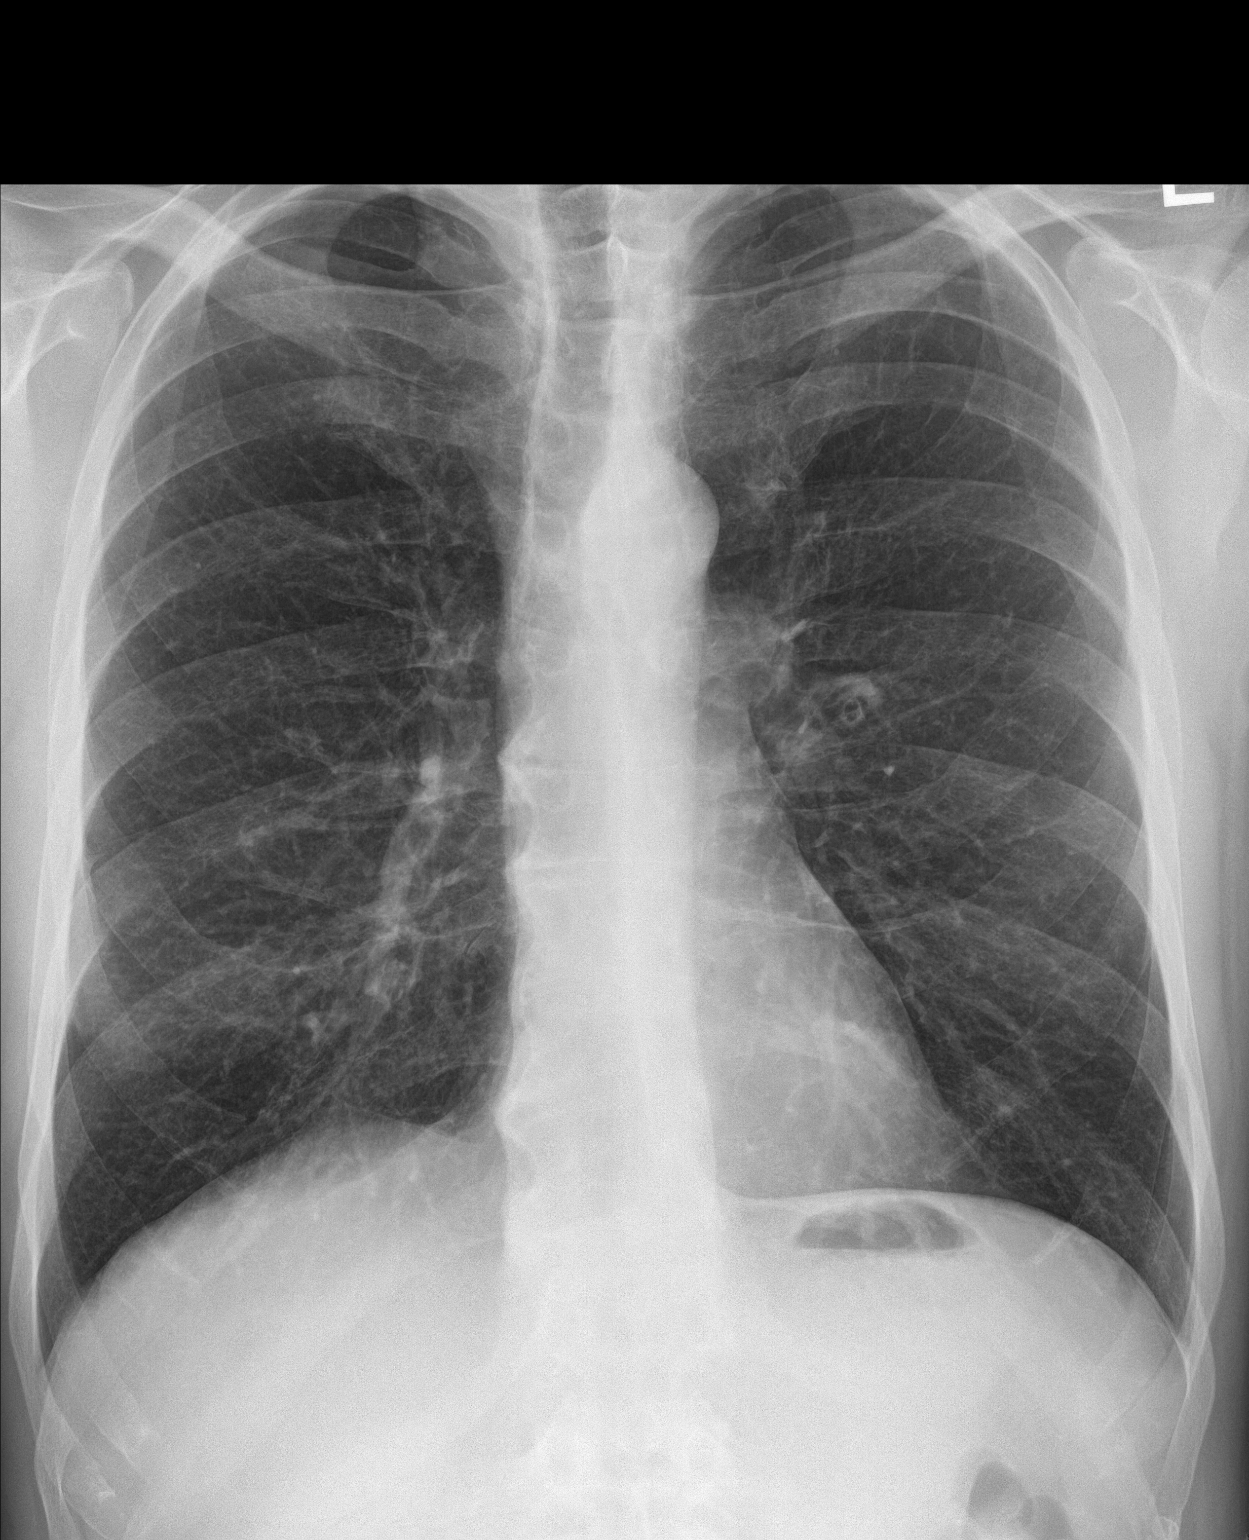

[chest lat]
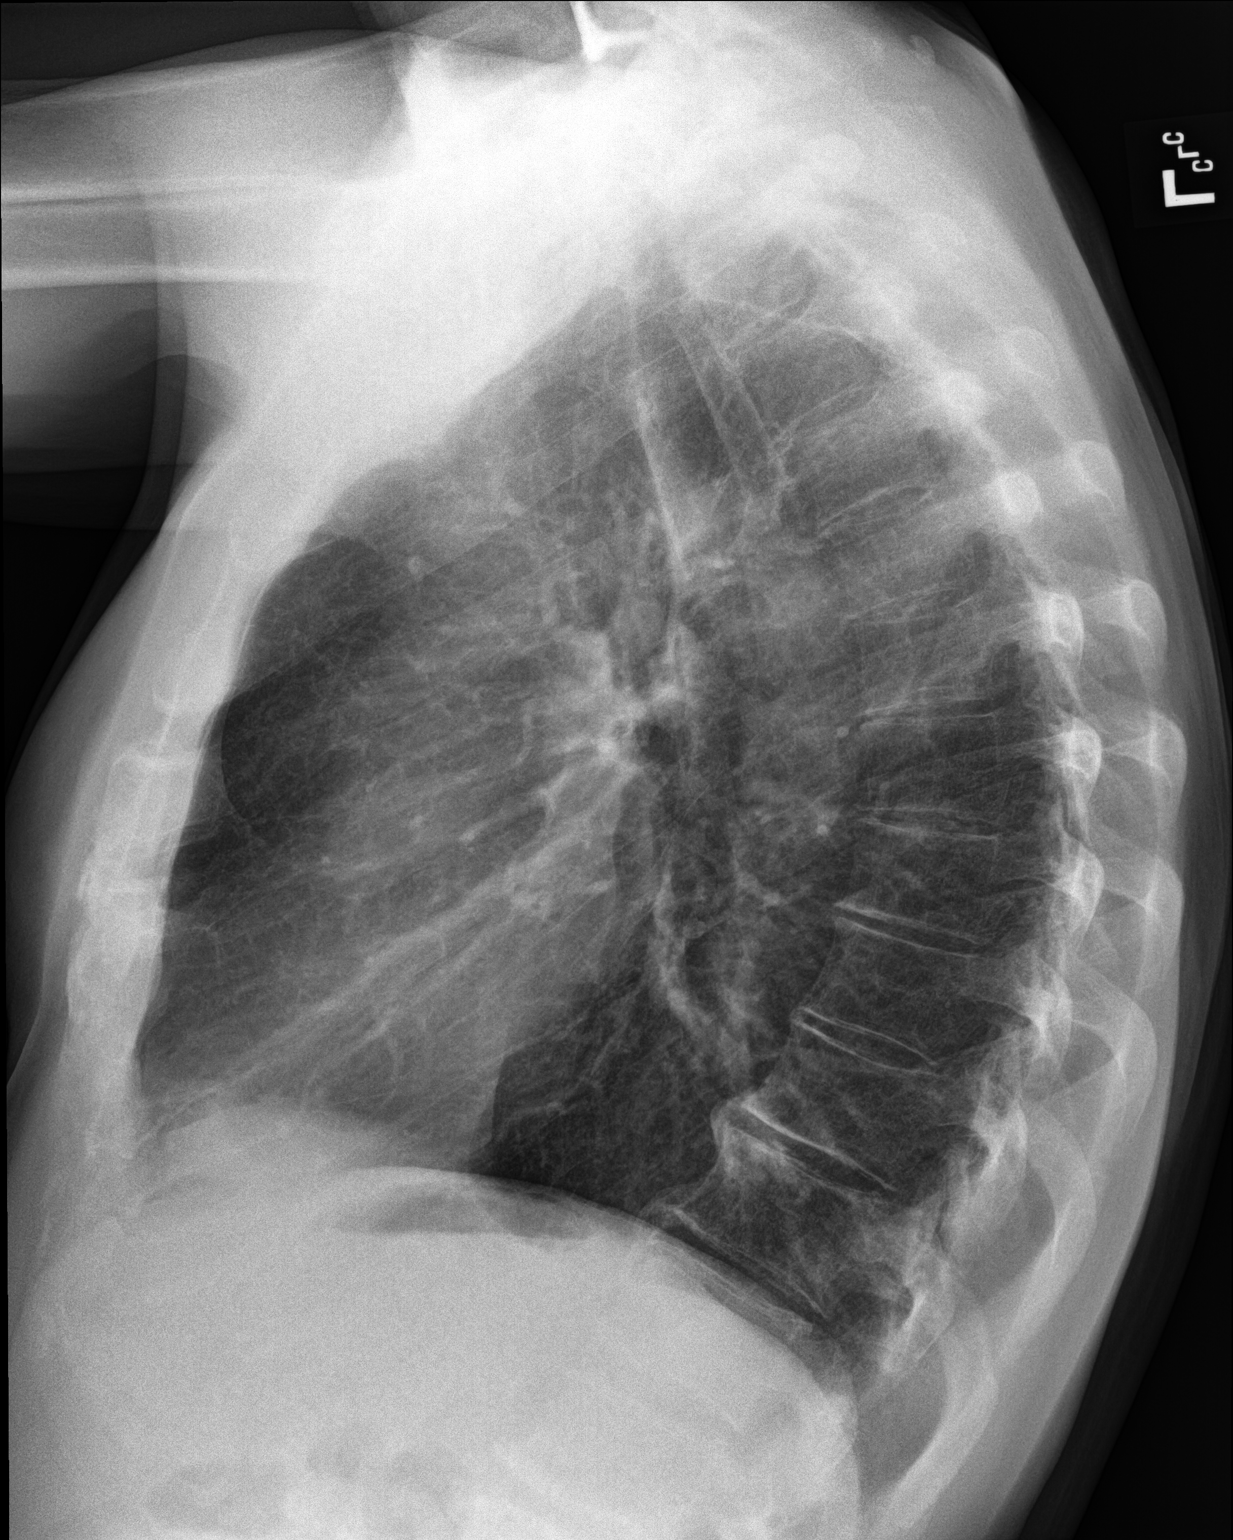

[2 of 2 positions shown; findings below may reference images not displayed]

FINDINGS: There is no appreciable edema or consolidation. The heart size and
pulmonary vascularity are normal. No adenopathy. There is
degenerative change in the lower thoracic region. There is
postoperative change in the lower cervical spine.
IMPRESSION: No edema or consolidation.

## 2019-09-09 ENCOUNTER — Other Ambulatory Visit: Payer: Self-pay | Admitting: Physical Medicine and Rehabilitation

## 2019-09-10 ENCOUNTER — Other Ambulatory Visit: Payer: Self-pay | Admitting: Physical Medicine and Rehabilitation

## 2019-09-10 DIAGNOSIS — M7989 Other specified soft tissue disorders: Secondary | ICD-10-CM

## 2019-09-22 ENCOUNTER — Other Ambulatory Visit: Payer: Self-pay | Admitting: Family

## 2019-10-05 ENCOUNTER — Ambulatory Visit (INDEPENDENT_AMBULATORY_CARE_PROVIDER_SITE_OTHER): Payer: Medicare Other

## 2019-10-05 ENCOUNTER — Ambulatory Visit (INDEPENDENT_AMBULATORY_CARE_PROVIDER_SITE_OTHER): Payer: Medicare Other | Admitting: Family

## 2019-10-05 ENCOUNTER — Other Ambulatory Visit: Payer: Self-pay

## 2019-10-05 ENCOUNTER — Encounter: Payer: Self-pay | Admitting: Family

## 2019-10-05 VITALS — BP 115/68 | HR 67 | Temp 98.1°F | Ht 73.0 in | Wt 175.2 lb

## 2019-10-05 DIAGNOSIS — M159 Polyosteoarthritis, unspecified: Secondary | ICD-10-CM

## 2019-10-05 DIAGNOSIS — F17200 Nicotine dependence, unspecified, uncomplicated: Secondary | ICD-10-CM | POA: Diagnosis not present

## 2019-10-05 DIAGNOSIS — Z01818 Encounter for other preprocedural examination: Secondary | ICD-10-CM

## 2019-10-05 DIAGNOSIS — J449 Chronic obstructive pulmonary disease, unspecified: Secondary | ICD-10-CM

## 2019-10-05 DIAGNOSIS — M1712 Unilateral primary osteoarthritis, left knee: Secondary | ICD-10-CM

## 2019-10-05 DIAGNOSIS — K219 Gastro-esophageal reflux disease without esophagitis: Secondary | ICD-10-CM

## 2019-10-05 DIAGNOSIS — Z0000 Encounter for general adult medical examination without abnormal findings: Secondary | ICD-10-CM

## 2019-10-05 HISTORY — DX: Unilateral primary osteoarthritis, left knee: M17.12

## 2019-10-05 MED ORDER — PROAIR RESPICLICK 108 (90 BASE) MCG/ACT IN AEPB: 2.0000 | INHALATION_SPRAY | RESPIRATORY_TRACT | 1 refills | Status: DC | PRN

## 2019-10-05 MED ORDER — PROAIR RESPICLICK 108 (90 BASE) MCG/ACT IN AEPB: 2.0000 | INHALATION_SPRAY | RESPIRATORY_TRACT | 3 refills | Status: DC | PRN

## 2019-10-05 NOTE — Patient Instructions (Signed)
Preparing for Knee Replacement °Getting prepared before knee replacement surgery can make your recovery easier and more comfortable. This document provides some tips and guidelines that will help you prepare for your surgery. °Talk with your health care provider so you can learn what to expect before, during, and after surgery. Ask questions if you do not understand something. °To ease concerns about your financial responsibilities, call your insurance company as soon as you decide to have surgery. Ask how much of your surgery and hospital stay will be covered. Also ask about coverage for medical equipment, rehabilitation facilities, and home care. °How should I arrange for help? °In the first couple weeks after surgery, it will likely be harder for you to do some of your regular activities. You may get tired easily, and you will have limited movement in your leg. Follow these guidelines to make sure you have all the help you need after your surgery: °· Plan to have someone take you home from the hospital. Your health care provider will tell you how many days you can expect to be in the hospital. °· Cancel all your work, caregiving, and volunteer responsibilities for at least 4-6 weeks after your surgery. °· Plan to have someone stay with you day and night for the first week. This person should be someone you are comfortable with. You may need this person to help you with your exercises and personal care, such as bathing and using the toilet. °· If you live alone, arrange for someone to take care of your home and pets for the first 4-6 weeks after surgery. °· Arrange for drivers to take you to and from follow-up appointments, the grocery store, and other places you may need to go for at least 4-6 weeks. °· Consider applying for a disabled parking permit. To get an application, contact the Department of Motor Vehicles or your health care provider's office. °How should I prepare my home? ° °  ° °· Pick a recovery  spot, but do not plan on recovering in bed. Sitting upright is better for your health. You may want to use a recliner with a small table nearby. Place the items you use most frequently on that table. These may include the TV remote, a cordless phone, a cell phone, a book or laptop computer, and a water glass. °· To see if you will be able to move around in your home with a walker, hold your hands out about 6 inches (15 cm) from your sides, and walk from your recovery spot to your kitchen and bathroom. Then walk from your bed to the bathroom. If you do not hit anything with your hands, you will have enough room for a walker. °· Minimize the use of stairs after you return home to reduce your risk of falling or tripping. °· Remove all clutter from your floors. Also remove any throw rugs. This will help you avoid tripping after your surgery. °· Move the items you use most often in your kitchen, bathroom, and bedroom to shelves and drawers that are at countertop height. °· Prepare a few meals to freeze and reheat later. °· Consider getting safety equipment that will be helpful during your recovery, such as: °? Grab bars added in the shower and near the toilet. °? A raised toilet seat to help you get on and off the toilet more easily. °? A tub or shower bench. °How should I prepare my body? °· Have a preoperative exam. °? During the exam, your health   care provider will make sure that your body is healthy enough to safely have the surgery. °? When you go to the exam, bring a complete list of all your medicines and supplements, including herbs and vitamins. °? You may need to have additional tests to ensure your safety. °· Have elective dental care and routine cleanings done before your surgery. Germs from anywhere in your body, including your mouth, can travel to your new joint and infect it. It is important that you do not have any dental work done for at least 3 months after your surgery. °· Maintain a healthy diet. Do  not change your diet before surgery unless your health care provider tells you to do that. °· Do not use any products that contain nicotine or tobacco, such as cigarettes and e-cigarettes. These can delay bone healing after surgery. If you need help quitting, ask your health care provider. °· Tell your health care provider if: °? You develop any skin infections or skin irritations. You may need to improve the condition of your skin before surgery. °? You have a fever, a cold, or any other illness in the week before your surgery. °· Do not drink any alcohol for at least 48 hours before surgery. °· The day before your surgery, follow instructions from your health care provider about showering, eating, drinking, and taking medicines. These directions are for your safety. °· Talk to your health care provider about doing exercises before your surgery. °? Be sure to follow the exercise program only as directed by your health care provider. °? Doing these exercises in the weeks before your surgery may help reduce pain and improve function after surgery. °Summary °· Getting prepared before knee replacement surgery can make your recovery easier and more comfortable. °· Prepare your home and arrange for help at home. °· Keep all of your preoperative appointments to ensure that you are ready for your surgery. °· Plan to have someone take you home from the hospital and stay with you day and night for the first week. °This information is not intended to replace advice given to you by your health care provider. Make sure you discuss any questions you have with your health care provider. °Document Revised: 06/03/2017 Document Reviewed: 06/03/2017 °Elsevier Patient Education © 2020 Elsevier Inc. ° °

## 2019-10-05 NOTE — Progress Notes (Addendum)
Subjective:    Patient ID: Wesley Schultz, male    DOB: [birth year], 69 y.o.   MRN: [id number]  Chief Complaint  Patient presents with  . Annual Exam   Pt presents to the office today for pre-op exam. He has bilateral osteoarthritis and is going to have a left knee replacement.  Arthritis Presents for follow-up visit. He complains of pain. The symptoms have been stable. Affected locations include the right knee and left knee. His pain is at a severity of 10/10.  Gastroesophageal Reflux He complains of belching and heartburn. This is a chronic problem. The current episode started more than 1 year ago. The problem occurs occasionally. The treatment provided moderate relief.  Constipation This is a chronic problem. The current episode started more than 1 year ago. The problem has been resolved since onset. His stool frequency is 1 time per day. He has tried stool softeners for the symptoms. The treatment provided moderate relief.  Nicotine Dependence Presents for follow-up visit. His urge triggers include company of smokers. The symptoms have been stable. He smokes 1 pack of cigarettes per day.  COPD Pt smokes a pack a day. Uses albuterol inhaler 1-2 times  A week.     Review of Systems  Gastrointestinal: Positive for constipation and heartburn.  Musculoskeletal: Positive for arthritis.  All other systems reviewed and are negative.  Family History  Problem Relation Age of Onset  . Colon cancer Sister 37  . Diabetes Mother   . Seizures Mother   . Heart disease Father   . Stomach cancer Neg Hx    Social History   Socioeconomic History  . Marital status: Married    Spouse name: Not on file  . Number of children: Not on file  . Years of education: Not on file  . Highest education level: Not on file  Occupational History  . Not on file  Tobacco Use  . Smoking status: Current Every Day Smoker    Packs/day: 1.00    Years: 40.00    Pack years: 40.00    Types: Cigarettes  .  Smokeless tobacco: Never Used  Substance and Sexual Activity  . Alcohol use: No  . Drug use: No  . Sexual activity: Never  Other Topics Concern  . Not on file  Social History Narrative  . Not on file   Social Determinants of Health   Financial Resource Strain:   . Difficulty of Paying Living Expenses:   Food Insecurity:   . Worried About Charity fundraiser in the Last Year:   . Arboriculturist in the Last Year:   Transportation Needs:   . Film/video editor (Medical):   Marland Kitchen Lack of Transportation (Non-Medical):   Physical Activity:   . Days of Exercise per Week:   . Minutes of Exercise per Session:   Stress:   . Feeling of Stress :   Social Connections:   . Frequency of Communication with Friends and Family:   . Frequency of Social Gatherings with Friends and Family:   . Attends Religious Services:   . Active Member of Clubs or Organizations:   . Attends Archivist Meetings:   Marland Kitchen Marital Status:        Objective:   Physical Exam Vitals reviewed.  Constitutional:      General: He is not in acute distress.    Appearance: He is well-developed.  HENT:     Head: Normocephalic.     Right Ear:  Tympanic membrane normal.     Left Ear: Tympanic membrane normal.  Eyes:     General:        Right eye: No discharge.        Left eye: No discharge.     Pupils: Pupils are equal, round, and reactive to light.  Neck:     Thyroid: No thyromegaly.  Cardiovascular:     Rate and Rhythm: Normal rate and regular rhythm.     Heart sounds: Normal heart sounds. No murmur.  Pulmonary:     Effort: Pulmonary effort is normal. No respiratory distress.     Breath sounds: Normal breath sounds. No wheezing.  Abdominal:     General: Bowel sounds are normal. There is no distension.     Palpations: Abdomen is soft.     Tenderness: There is no abdominal tenderness.  Musculoskeletal:        General: Tenderness (pain in bilateral knees with flexion and extension) present. Normal  range of motion.     Cervical back: Normal range of motion and neck supple.  Skin:    General: Skin is warm and dry.     Findings: No erythema or rash.  Neurological:     Mental Status: He is alert and oriented to person, place, and time.     Cranial Nerves: No cranial nerve deficit.     Deep Tendon Reflexes: Reflexes are normal and symmetric.  Psychiatric:        Behavior: Behavior normal.        Thought Content: Thought content normal.        Judgment: Judgment normal.      BP 115/68   Pulse 67   Temp 98.1 F (36.7 C) (Temporal)   Ht _0  (1.854 m)   Wt 175 lb 3.2 oz (79.5 kg)   SpO2 93%   BMI 23.11 kg/m       Assessment & Plan:  SUSANA GRIPP comes in today with chief complaint of Annual Exam and COPD   Diagnosis and orders addressed:  1. COPD, mild (HCC) - CMP14+EGFR - Albuterol Sulfate (PROAIR RESPICLICK) 267 (90 Base) MCG/ACT AEPB; Inhale 2 puffs into the lungs every 4 (four) hours as needed.  Dispense: 1 each; Refill: 3  2. Smoker Smoking cessation discussed - CMP14+EGFR  3. Gastroesophageal reflux disease, unspecified whether esophagitis present - CMP14+EGFR  4. Osteoarthritis of multiple joints, unspecified osteoarthritis type - CMP14+EGFR  5. Pre-op exam - CMP14+EGFR - Anemia Profile B - DG Chest 2 View; Future - EKG 12-Lead  6. Annual physical exam - CMP14+EGFR - Lipid panel - PSA, total and free - TSH - Anemia Profile B - DG Chest 2 View; Future - EKG 12-Lead   Labs pending Health Maintenance reviewed Diet and exercise encouraged  Follow up plan: 1 year   Evelina Dun, FNP

## 2019-10-06 ENCOUNTER — Telehealth: Payer: Self-pay | Admitting: *Deleted

## 2019-10-06 ENCOUNTER — Telehealth: Payer: Self-pay | Admitting: Family

## 2019-10-06 ENCOUNTER — Other Ambulatory Visit: Payer: Self-pay | Admitting: Family

## 2019-10-06 DIAGNOSIS — Z01818 Encounter for other preprocedural examination: Secondary | ICD-10-CM

## 2019-10-06 DIAGNOSIS — F17200 Nicotine dependence, unspecified, uncomplicated: Secondary | ICD-10-CM

## 2019-10-06 DIAGNOSIS — R9431 Abnormal electrocardiogram [ECG] [EKG]: Secondary | ICD-10-CM

## 2019-10-06 DIAGNOSIS — E785 Hyperlipidemia, unspecified: Secondary | ICD-10-CM | POA: Insufficient documentation

## 2019-10-06 LAB — ANEMIA PROFILE B
Basophils Absolute: 0.1 10*3/uL (ref 0.0–0.2)
Basos: 1 %
EOS (ABSOLUTE): 0.1 10*3/uL (ref 0.0–0.4)
Eos: 2 %
Ferritin: 90 ng/mL (ref 30–400)
Folate: 13 ng/mL (ref >3.0)
Hematocrit: 48.4 % (ref 37.5–51.0)
Hemoglobin: 16 g/dL (ref 13.0–17.7)
Immature Grans (Abs): 0 10*3/uL (ref 0.0–0.1)
Immature Granulocytes: 0 %
Iron Saturation: 36 % (ref 15–55)
Iron: 109 ug/dL (ref 38–169)
Lymphocytes Absolute: 2.5 10*3/uL (ref 0.7–3.1)
Lymphs: 32 %
MCH: 29.9 pg (ref 26.6–33.0)
MCHC: 33.1 g/dL (ref 31.5–35.7)
MCV: 91 fL (ref 79–97)
Monocytes Absolute: 0.6 10*3/uL (ref 0.1–0.9)
Monocytes: 8 %
Neutrophils Absolute: 4.5 10*3/uL (ref 1.4–7.0)
Neutrophils: 57 %
Platelets: 174 10*3/uL (ref 150–450)
RBC: 5.35 x10E6/uL (ref 4.14–5.80)
RDW: 12.9 % (ref 11.6–15.4)
Retic Ct Pct: 1 % (ref 0.6–2.6)
Total Iron Binding Capacity: 299 ug/dL (ref 250–450)
UIBC: 190 ug/dL (ref 111–343)
Vitamin B-12: 491 pg/mL (ref 232–1245)
WBC: 7.8 10*3/uL (ref 3.4–10.8)

## 2019-10-06 LAB — CMP14+EGFR
ALT: 16 IU/L (ref 0–44)
AST: 19 IU/L (ref 0–40)
Albumin/Globulin Ratio: 1.7 (ref 1.2–2.2)
Albumin: 4.1 g/dL (ref 3.8–4.8)
Alkaline Phosphatase: 81 IU/L (ref 48–121)
BUN/Creatinine Ratio: 29 — ABNORMAL HIGH (ref 10–24)
BUN: 24 mg/dL (ref 8–27)
Bilirubin Total: 0.3 mg/dL (ref 0.0–1.2)
CO2: 23 mmol/L (ref 20–29)
Calcium: 9 mg/dL (ref 8.6–10.2)
Chloride: 101 mmol/L (ref 96–106)
Creatinine, Ser: 0.84 mg/dL (ref 0.76–1.27)
GFR calc Af Amer: 105 mL/min/{1.73_m2} (ref >59)
GFR calc non Af Amer: 91 mL/min/{1.73_m2} (ref >59)
Globulin, Total: 2.4 g/dL (ref 1.5–4.5)
Glucose: 98 mg/dL (ref 65–99)
Potassium: 4.1 mmol/L (ref 3.5–5.2)
Sodium: 138 mmol/L (ref 134–144)
Total Protein: 6.5 g/dL (ref 6.0–8.5)

## 2019-10-06 LAB — LIPID PANEL
Chol/HDL Ratio: 4.1 ratio (ref 0.0–5.0)
Cholesterol, Total: 186 mg/dL (ref 100–199)
HDL: 45 mg/dL (ref >39)
LDL Chol Calc (NIH): 103 mg/dL — ABNORMAL HIGH (ref 0–99)
Triglycerides: 223 mg/dL — ABNORMAL HIGH (ref 0–149)
VLDL Cholesterol Cal: 38 mg/dL (ref 5–40)

## 2019-10-06 LAB — TSH: TSH: 4.21 u[IU]/mL (ref 0.450–4.500)

## 2019-10-06 LAB — PSA, TOTAL AND FREE
PSA, Free Pct: 27 %
PSA, Free: 0.27 ng/mL
Prostate Specific Ag, Serum: 1 ng/mL (ref 0.0–4.0)

## 2019-10-06 MED ORDER — ALBUTEROL SULFATE HFA 108 (90 BASE) MCG/ACT IN AERS: 2.0000 | INHALATION_SPRAY | Freq: Four times a day (QID) | RESPIRATORY_TRACT | 3 refills | Status: DC | PRN

## 2019-10-06 NOTE — Addendum Note (Signed)
Addended by: Evelina Dun A on: 10/06/2019 01:55 PM   Modules accepted: Orders

## 2019-10-06 NOTE — Telephone Encounter (Signed)
Prescription sent to pharmacy.

## 2019-10-06 NOTE — Telephone Encounter (Signed)
Fax from [facility] Not covered by insurance  Can this be changed to regular ProAir Please advise and send new Rx if appropriate

## 2019-10-07 NOTE — Telephone Encounter (Signed)
Requesting lab results

## 2019-10-07 NOTE — Telephone Encounter (Signed)
Called patients wife they have seen on mychart faxed to surgeon per patient

## 2019-10-08 NOTE — Addendum Note (Signed)
Addended by: Evelina Dun A on: 10/08/2019 02:19 PM   Modules accepted: Level of Service

## 2019-10-19 ENCOUNTER — Other Ambulatory Visit: Payer: Self-pay | Admitting: Family

## 2019-10-23 ENCOUNTER — Ambulatory Visit (INDEPENDENT_AMBULATORY_CARE_PROVIDER_SITE_OTHER): Payer: Medicare Other | Admitting: Cardiovascular Disease

## 2019-10-23 ENCOUNTER — Encounter: Payer: Self-pay | Admitting: Cardiovascular Disease

## 2019-10-23 ENCOUNTER — Other Ambulatory Visit: Payer: Self-pay

## 2019-10-23 DIAGNOSIS — Z01818 Encounter for other preprocedural examination: Secondary | ICD-10-CM | POA: Diagnosis not present

## 2019-10-23 DIAGNOSIS — Z8249 Family history of ischemic heart disease and other diseases of the circulatory system: Secondary | ICD-10-CM | POA: Diagnosis not present

## 2019-10-23 DIAGNOSIS — R0789 Other chest pain: Secondary | ICD-10-CM | POA: Insufficient documentation

## 2019-10-23 DIAGNOSIS — Z01810 Encounter for preprocedural cardiovascular examination: Secondary | ICD-10-CM | POA: Diagnosis not present

## 2019-10-23 NOTE — Patient Instructions (Signed)
Medication Instructions:  Your Physician recommend you continue on your current medication as directed.    *If you need a refill on your cardiac medications before your next appointment, please call your pharmacy*   Lab Work: None  Testing/Procedures: Your physician has requested that you have a lexiscan myoview. For further information please visit [email or web address]. Please follow instruction sheet, as given. [address]    Follow-Up: At [facility], you and your health needs are our priority.  As part of our continuing mission to provide you with exceptional heart care, we have created designated Provider Care Teams.  These Care Teams include your primary Cardiologist (physician) and Advanced Practice Providers (APPs -  Physician Assistants and Nurse Practitioners) who all work together to provide you with the care you need, when you need it.  We recommend signing up for the patient portal called "MyChart".  Sign up information is provided on this After Visit Summary.  MyChart is used to connect with patients for Virtual Visits (Telemedicine).  Patients are able to view lab/test results, encounter notes, upcoming appointments, etc.  Non-urgent messages can be sent to your provider as well.   To learn more about what you can do with MyChart, go to [email or web address].    Your next appointment:   As needed  The format for your next appointment:   In Person  Provider:   Quay Burow, MD  You are scheduled for a Myocardial Perfusion Imaging Study on.  Please arrive 15 minutes prior to your appointment time for registration and insurance purposes.  The test will take approximately 3 to 4 hours to complete; you may bring reading material.  If someone comes with you to your appointment, they will need to remain in the main lobby due to limited space in the testing area. **If you are pregnant or breastfeeding, please notify the nuclear lab prior to your  appointment**  How to prepare for your Myocardial Perfusion Test: . Do not eat or drink 3 hours prior to your test, except you may have water. . Do not consume products containing caffeine (regular or decaffeinated) 12 hours prior to your test. (ex: coffee, chocolate, sodas, tea). . Do bring a list of your current medications with you.  If not listed below, you may take your medications as normal. . Do wear comfortable clothes (no dresses or overalls) and walking shoes, tennis shoes preferred (No heels or open toe shoes are allowed). . Do NOT wear cologne, perfume, aftershave, or lotions (deodorant is allowed). . If these instructions are not followed, your test will have to be rescheduled.  Please report to [place], [address] for your test.  If you have questions or concerns about your appointment, you can call the Nuclear Lab at [phone].  If you cannot keep your appointment, please provide 24 hours notification to the Nuclear Lab, to avoid a possible $50 charge to your account.

## 2019-10-23 NOTE — Assessment & Plan Note (Signed)
Patient needs total knee replacement by Dr. Noemi Chapel .  Because of his family history and long history tobacco abuse I am going to get a Myoview stress test to risk stratify

## 2019-10-23 NOTE — Assessment & Plan Note (Signed)
Father had his first myocardial infarction in his early 57s.

## 2019-10-23 NOTE — Assessment & Plan Note (Signed)
Wesley Schultz gets occasional atypical chest pain every other week.  Does have a family history of heart disease with a father who had a myocardial infarction in his early 51s and has greater than 50-pack-year history tobacco abuse.  And get a Lexiscan Myoview to risk stratify him.

## 2019-10-23 NOTE — Progress Notes (Signed)
10/23/2019 Wesley Schultz   1951/01/09  [id number]  Primary Physician Sharion Balloon, FNP Primary Cardiologist: Lorretta Harp MD [name], [place]  HPI:  Wesley Schultz is a 69 y.o. 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  He was referred by Dr. Noemi Chapel for preoperative clearance before elective total knee replacement.  His risk factors include over 50 pack years of tobacco abuse continue to smoke 1 pack/day.  Does have a family history of heart disease the father had a myocardial infarction in his early 92s.  Is never had a heart attack or stroke.  Does have COPD.  Also complains of atypical chest pain occurring every other week.  He has had 3 back surgeries in the past.   Current Meds  Medication Sig  . aspirin 81 MG tablet Take 81 mg by mouth daily.  Marland Kitchen docusate sodium (COLACE) 50 MG capsule Take 50 mg by mouth daily.  . fish oil-omega-3 fatty acids 1000 MG capsule Take 1 g by mouth daily.   . [DISCONTINUED] albuterol (VENTOLIN HFA) 108 (90 Base) MCG/ACT inhaler Inhale 2 puffs into the lungs every 6 (six) hours as needed for wheezing or shortness of breath.     Allergies  Allergen Reactions  . Codeine Nausea Only    Social History   Socioeconomic History  . Marital status: Married    Spouse name: Not on file  . Number of children: Not on file  . Years of education: Not on file  . Highest education level: Not on file  Occupational History  . Not on file  Tobacco Use  . Smoking status: Current Every Day Smoker    Packs/day: 1.00    Years: 40.00    Pack years: 40.00    Types: Cigarettes  . Smokeless tobacco: Never Used  Substance and Sexual Activity  . Alcohol use: No  . Drug use: No  . Sexual activity: Never  Other Topics Concern  . Not on file  Social History Narrative  . Not on file   Social Determinants of Health   Financial Resource Strain:   . Difficulty of Paying Living Expenses:   Food  Insecurity:   . Worried About Charity fundraiser in the Last Year:   . Arboriculturist in the Last Year:   Transportation Needs:   . Film/video editor (Medical):   Marland Kitchen Lack of Transportation (Non-Medical):   Physical Activity:   . Days of Exercise per Week:   . Minutes of Exercise per Session:   Stress:   . Feeling of Stress :   Social Connections:   . Frequency of Communication with Friends and Family:   . Frequency of Social Gatherings with Friends and Family:   . Attends Religious Services:   . Active Member of Clubs or Organizations:   . Attends Archivist Meetings:   Marland Kitchen Marital Status:   Intimate Partner Violence:   . Fear of Current or Ex-Partner:   . Emotionally Abused:   Marland Kitchen Physically Abused:   . Sexually Abused:      Review of Systems: General: negative for chills, fever, night sweats or weight changes.  Cardiovascular: negative for chest pain, dyspnea on exertion, edema, orthopnea, palpitations, paroxysmal nocturnal dyspnea or shortness of breath Dermatological: negative for rash Respiratory: negative for cough or wheezing Urologic: negative for hematuria Abdominal: negative for nausea, vomiting, diarrhea, bright red blood per rectum, melena,  or hematemesis Neurologic: negative for visual changes, syncope, or dizziness All other systems reviewed and are otherwise negative except as noted above.    Blood pressure 132/70, pulse (!) 58, height 6\' 1"  (1.854 m), weight 180 lb (81.6 kg).  General appearance: alert and no distress Neck: no adenopathy, no carotid bruit, no JVD, supple, symmetrical, trachea midline and thyroid not enlarged, symmetric, no tenderness/mass/nodules Lungs: clear to auscultation bilaterally Heart: regular rate and rhythm, S1, S2 normal, no murmur, click, rub or gallop Extremities: extremities normal, atraumatic, no cyanosis or edema Pulses: 2+ and symmetric Skin: Skin color, texture, turgor normal. No rashes or lesions Neurologic:  Alert and oriented X 3, normal strength and tone. Normal symmetric reflexes. Normal coordination and gait  EKG sinus bradycardia at 58 with septal Q waves and nonspecific ST and T wave changes.  I personally reviewed this EKG.  ASSESSMENT AND PLAN:   Family history of heart disease Father had his first myocardial infarction in his early 58s.  Atypical chest pain Mr. Passage gets occasional atypical chest pain every other week.  Does have a family history of heart disease with a father who had a myocardial infarction in his early 27s and has greater than 50-pack-year history tobacco abuse.  And get a Lexiscan Myoview to risk stratify him.  Preoperative clearance Patient needs total knee replacement by Dr. Noemi Chapel .  Because of his family history and long history tobacco abuse I am going to get a Myoview stress test to risk stratify      Lorretta Harp MD [facility], [facility] 10/23/2019 11:30 AM

## 2019-10-28 ENCOUNTER — Telehealth (HOSPITAL_COMMUNITY): Payer: Self-pay

## 2019-10-28 NOTE — Telephone Encounter (Signed)
Encounter complete. 

## 2019-10-30 ENCOUNTER — Other Ambulatory Visit: Payer: Self-pay

## 2019-10-30 ENCOUNTER — Ambulatory Visit (HOSPITAL_COMMUNITY)
Admission: RE | Admit: 2019-10-30 | Discharge: 2019-10-30 | Disposition: A | Discharge status: Home / Self Care | Payer: Medicare Other | Source: Ambulatory Visit | Attending: Cardiovascular Disease | Admitting: Cardiovascular Disease

## 2019-10-30 DIAGNOSIS — R0789 Other chest pain: Secondary | ICD-10-CM

## 2019-10-30 DIAGNOSIS — Z01818 Encounter for other preprocedural examination: Secondary | ICD-10-CM | POA: Diagnosis not present

## 2019-10-30 DIAGNOSIS — Z8249 Family history of ischemic heart disease and other diseases of the circulatory system: Secondary | ICD-10-CM | POA: Insufficient documentation

## 2019-10-30 DIAGNOSIS — Z01810 Encounter for preprocedural cardiovascular examination: Secondary | ICD-10-CM | POA: Diagnosis not present

## 2019-10-30 LAB — MYOCARDIAL PERFUSION IMAGING
LV dias vol: 155 mL (ref 62–150)
LV sys vol: 83 mL
Peak HR: 80 {beats}/min
Rest HR: 51 {beats}/min
SDS: 4
SRS: 2
SSS: 6
TID: 1.05

## 2019-10-30 MED ORDER — REGADENOSON 0.4 MG/5ML IV SOLN
0.4000 mg | Freq: Once | INTRAVENOUS | Status: AC
  Administered 2019-10-30: 0.4 mg via INTRAVENOUS

## 2019-10-30 MED ORDER — TECHNETIUM TC 99M TETROFOSMIN IV KIT
10.8000 | PACK | Freq: Once | INTRAVENOUS | Status: AC | PRN
  Administered 2019-10-30: 10.8 via INTRAVENOUS
  Filled 2019-10-30: qty 11

## 2019-10-30 MED ORDER — TECHNETIUM TC 99M TETROFOSMIN IV KIT
31.2000 | PACK | Freq: Once | INTRAVENOUS | Status: AC | PRN
  Administered 2019-10-30: 31.2 via INTRAVENOUS
  Filled 2019-10-30: qty 32

## 2019-11-05 ENCOUNTER — Telehealth: Payer: Self-pay | Admitting: *Deleted

## 2019-11-05 NOTE — Telephone Encounter (Signed)
Low risk Myoview.  Cleared for his knee replacement low risk

## 2019-11-05 NOTE — Telephone Encounter (Signed)
The patient came in today as a walk in to discuss his Stress Test results.  Low risk MV. Minimally abn. Back to see me in 4 weeks  He wanted to know if he was cleared for surgery or if he needs to come in for an appointment first.

## 2019-11-06 NOTE — Telephone Encounter (Signed)
Left a message for the patient to call back.  

## 2019-11-09 NOTE — Telephone Encounter (Signed)
Follow up ° ° °Patient is returning your call. Please call. ° ° ° °

## 2019-11-09 NOTE — Telephone Encounter (Signed)
The patient has been made aware and verbalized his understanding. Note put in for one month follow up.

## 2019-11-10 ENCOUNTER — Telehealth: Payer: Self-pay | Admitting: Cardiovascular Disease

## 2019-11-10 NOTE — Telephone Encounter (Signed)
   [facility] Pre-operative Risk Assessment    HEARTCARE STAFF: - Please ensure there is not already an duplicate clearance open for this procedure. - Under Visit Info/Reason for Call, type in Other and utilize the format Clearance MM/DD/YY or Clearance TBD. Do not use dashes or single digits. - If request is for dental extraction, please clarify the # of teeth to be extracted.  Request for surgical clearance:  1. What type of surgery is being performed? Left total knee replacement  2. When is this surgery scheduled? TBD   3. What type of clearance is required (medical clearance vs. Pharmacy clearance to hold med vs. Both)? medical  4. Are there any medications that need to be held prior to surgery and how long? Aspirin, leaving up to cardiologit  5. Practice name and name of physician performing surgery? [facility], Dr. Elsie Saas  6. What is the office phone number? [phone]   7.   What is the office fax number? [phone]  8.   Anesthesia type (None, local, MAC, general) ? spinal   Wesley Schultz 11/10/2019, 1:09 PM  _________________________________________________________________   (provider comments below)

## 2019-11-10 NOTE — Telephone Encounter (Signed)
   Primary Cardiologist: Quay Burow, MD  Chart reviewed as part of pre-operative protocol coverage. Clearance addressed by Dr. Gwenlyn Found after preop evaluation visit 10/23/19. He underwent a NST which was low risk. Per Dr. Gwenlyn Found, Wesley Schultz would be at acceptable risk for the planned procedure without further cardiovascular testing.   If needed, patient can hold aspirin 5-7 days prior to his surgery with plans to restart when cleared to do so by his orthopedist.   I will route this recommendation to the requesting party via [place] fax function and remove from pre-op pool.  Please call with questions.  Abigail Butts, PA-C 11/10/2019, 2:04 PM

## 2019-12-02 ENCOUNTER — Encounter: Payer: Self-pay | Admitting: Physician Assistant

## 2019-12-02 ENCOUNTER — Other Ambulatory Visit: Payer: Self-pay | Admitting: Physician Assistant

## 2019-12-02 DIAGNOSIS — M1712 Unilateral primary osteoarthritis, left knee: Secondary | ICD-10-CM

## 2019-12-02 DIAGNOSIS — Z96652 Presence of left artificial knee joint: Secondary | ICD-10-CM

## 2019-12-15 ENCOUNTER — Encounter: Payer: Self-pay | Admitting: Cardiovascular Disease

## 2019-12-15 ENCOUNTER — Other Ambulatory Visit: Payer: Self-pay

## 2019-12-15 ENCOUNTER — Ambulatory Visit (INDEPENDENT_AMBULATORY_CARE_PROVIDER_SITE_OTHER): Payer: Medicare Other | Admitting: Cardiovascular Disease

## 2019-12-15 VITALS — BP 126/70 | HR 56 | Ht 72.0 in | Wt 185.2 lb

## 2019-12-15 DIAGNOSIS — Z01818 Encounter for other preprocedural examination: Secondary | ICD-10-CM

## 2019-12-15 NOTE — Progress Notes (Signed)
Mr. Blank returns today for follow-up of his Myoview stress test performed 10/30/2019 for preoperative clearance before total knee replacement scheduled for tomorrow.  I reviewed the study personally and there was document attenuation.  Did not think that there was ischemia.  He is asymptomatic.  I have cleared him at low risk.  See him back in 12 months.  Lorretta Harp, M.D., [place], [facility], [place] [address] [place], [place]  [address]  [phone] 12/15/2019 1:43 PM

## 2019-12-15 NOTE — Patient Instructions (Signed)

## 2019-12-18 ENCOUNTER — Ambulatory Visit: Payer: Medicare Other | Attending: Physician Assistant | Admitting: Physical Therapy

## 2019-12-18 ENCOUNTER — Encounter: Payer: Self-pay | Admitting: Physical Therapy

## 2019-12-18 ENCOUNTER — Other Ambulatory Visit: Payer: Self-pay

## 2019-12-18 DIAGNOSIS — M25662 Stiffness of left knee, not elsewhere classified: Secondary | ICD-10-CM | POA: Insufficient documentation

## 2019-12-18 DIAGNOSIS — M6281 Muscle weakness (generalized): Secondary | ICD-10-CM | POA: Diagnosis present

## 2019-12-18 DIAGNOSIS — M25562 Pain in left knee: Secondary | ICD-10-CM | POA: Insufficient documentation

## 2019-12-18 DIAGNOSIS — R600 Localized edema: Secondary | ICD-10-CM | POA: Insufficient documentation

## 2019-12-18 NOTE — Therapy (Signed)
[facility] [place], [place], [address] Phone: [phone]   Fax:  [phone]  Physical Therapy Evaluation  Patient Details  Name: Wesley Schultz MRN: [id number] Date of Birth: 1950-11-09 Referring Provider (PT): Dr. Noemi Chapel, MD   Encounter Date: 12/18/2019   PT End of Session - 12/18/19 1226    Visit Number 1    Number of Visits 12    Date for PT Re-Evaluation 02/05/20    Authorization Type FOTO; progress note every 10th visit; KX modifier at 15th visit    PT Start Time 1030    PT Stop Time 1110    PT Time Calculation (min) 40 min    Equipment Utilized During Treatment Other (comment)   rolling walker   Activity Tolerance Patient tolerated treatment well    Behavior During Therapy [facility] for tasks assessed/performed           Past Medical History:  Diagnosis Date  . Arthritis   . Asthma   . Colon polyps   . GERD (gastroesophageal reflux disease)   . Primary localized osteoarthritis of left knee 10/05/2019    Past Surgical History:  Procedure Laterality Date  . BACK SURGERY     2008, 2009, 2010  . CATARACT EXTRACTION, BILATERAL    . CERVICAL SPINE SURGERY    . HERNIA REPAIR    . LUMBAR LAMINECTOMY      There were no vitals filed for this visit.    Subjective Assessment - 12/18/19 1223    Subjective COVID-19 screening performed upon arrival.Patient arrives to physical therapy with left knee and quadriceps pain, difficulty walking, and difficulty performing ADLs secondary to a left TKA on 12/16/2019. Patient reports being compliant with CPM machine and HEP provided by the surgeon. Patient reports gaining assistance for dressing from wife. Patient ambulates around the home with both walker and walking stick, depending on how his knee feels. Patient reports pain at worst as 10/10 and pain at best as 2-3/10. Patient's goals are to decrease pain, improve movement, improve standing tolerance, improve ability to perform  home activities, and walk without an AD.    Pertinent History L TKA 12/16/2019, Asthma, history of lumbar surgery    Limitations Sitting;Standing;Walking;House hold activities    How long can you stand comfortably? short periods    How long can you walk comfortably? around home    Patient Stated Goals walk without AD    Currently in Pain? Yes    Pain Score 5     Pain Location Knee    Pain Orientation Left    Pain Descriptors / Indicators Sore    Pain Type Surgical pain    Pain Onset In the past 7 days    Pain Frequency Constant    Aggravating Factors  movement    Pain Relieving Factors pain meds, ice pack    Effect of Pain on Daily Activities needs assistance with dressing.              [facility] PT Assessment - 12/18/19 0001      Assessment   Medical Diagnosis S/P left total knee replacement    Referring Provider (PT) Dr. Noemi Chapel, MD    Onset Date/Surgical Date 12/16/19    Next MD Visit 12/24/2019    Prior Therapy no      Precautions   Precautions Other (comment)    Precaution Comments no ultrasound      Restrictions   Weight Bearing Restrictions No  Balance Screen   Has the patient fallen in the past 6 months No    Has the patient had a decrease in activity level because of a fear of falling?  No    Is the patient reluctant to leave their home because of a fear of falling?  No      Home Environment   Living Environment Private residence    Living Arrangements Spouse/significant other    Type of Page Two level   to basement   Alternate Level Stairs-Number of Steps 10    Alternate Level Stairs-Rails Left      Prior Function   Level of Independence Independent with basic ADLs;Needs assistance with ADLs      Observation/Other Assessments-Edema    Edema Circumferential   unable to get accurate reading due to dressing     Circumferential Edema   Circumferential - Left  --   measure next visit     ROM / Strength   AROM / PROM / Strength  AROM;PROM      AROM   Overall AROM  Deficits;Due to pain    AROM Assessment Site Knee    Right/Left Knee Left    Left Knee Extension 4    Left Knee Flexion 84      PROM   Overall PROM  Deficits;Due to pain    PROM Assessment Site Knee    Right/Left Knee Left    Left Knee Extension 2    Left Knee Flexion 86      Palpation   Patella mobility decreased in all planes    Palpation comment Pain with palaption to left quad      Transfers   Comments slow cautious transfers, intermittent UE suppport at left LE for sit <>supine      Ambulation/Gait   Assistive device Rolling walker    Gait Pattern Step-through pattern;Decreased stride length;Decreased stance time - left;Decreased step length - right;Decreased hip/knee flexion - left;Decreased weight shift to left;Antalgic;Trunk flexed    Ambulation Surface Level;Indoor                      Objective measurements completed on examination: See above findings.       [facility] Adult PT Treatment/Exercise - 12/18/19 0001      Modalities   Modalities Vasopneumatic      Vasopneumatic   Number Minutes Vasopneumatic  15 minutes    Vasopnuematic Location  Knee    Vasopneumatic Pressure Low    Vasopneumatic Temperature  34 for pain and edema                  PT Education - 12/18/19 1226    Education Details Continue HEP and CPM as prescribed.    Person(s) Educated Patient    Methods Explanation;Demonstration;Handout    Comprehension Verbalized understanding;Returned demonstration            PT Short Term Goals - 12/18/19 1229      PT SHORT TERM GOAL #1   Title Patient will ambulate with SPC with minimal gait devations and left knee pain less than or equal to 2/10.    Time 2    Period Weeks    Status New      PT SHORT TERM GOAL #2   Title Patient will demonstrate 2-90 degrees of left knee AROM to improve ability to perform functional tasks.    Time 2    Period Weeks  Status New             PT  Long Term Goals - 12/18/19 1230      PT LONG TERM GOAL #1   Title Patient will be independent with advanced HEP    Time 6    Period Weeks    Status New      PT LONG TERM GOAL #2   Title Patient will demonstrate 0-115+ degrees of left knee AROM to improve ability to perform ADLs and functional tasks.    Time 6    Period Weeks    Status New      PT LONG TERM GOAL #3   Title Patient will demonstrate reciprocating stair ambulation with one rail to safely acess basement.    Time 6    Period Weeks    Status New      PT LONG TERM GOAL #4   Title Patient will demonstrate 4+/5 left knee MMT in all planes to improve stability during functional tasks.    Time 6    Period Weeks    Status New      PT LONG TERM GOAL #5   Title Patient will be independent with performing ADLs and home activities with left knee pain less than or equal to 2/10.    Time 6    Period Weeks    Status New                  Plan - 12/18/19 1247    Clinical Impression Statement Patient is a 69 year old male who presents to physical therapy with left knee pain, decreased ROM, and difficulty walking secondary to a left TKA on 12/16/2019. Large dressings applied to left knee therefore edema was not formally measured. Patient and PT discussed plan of care and discussed continuing HEP to maximize PT benefit. Patient reported understanding. Patient would benefit from skilled physical therapy to address deficits and address patient's goals.    Personal Factors and Comorbidities Age;Comorbidity 2    Comorbidities L TKA 12/16/2019, Asthma, history of lumbar surgery    Examination-Activity Limitations Locomotion Level;Transfers;Stand;Stairs;Dressing    Stability/Clinical Decision Making Stable/Uncomplicated    Clinical Decision Making Low    Rehab Potential Excellent    PT Frequency --   2-3x per week   PT Duration 6 weeks    PT Treatment/Interventions ADLs/Self Care Home Management;Cryotherapy;Electrical  Stimulation;Moist Heat;Gait training;Stair training;Functional mobility training;Therapeutic activities;Therapeutic exercise;Balance training;Neuromuscular re-education;Manual techniques;Passive range of motion;Patient/family education;Scar mobilization;Vasopneumatic Device;Taping    PT Next Visit Plan measure edema if large gauze dressing was removed. nustep, AROM/AAROM/PROM to left knee; modalities PRN for pain relief; Wean from walker by post op week 2 per MD.    PT Home Exercise Plan see patient education section    Consulted and Agree with Plan of Care Patient           Patient will benefit from skilled therapeutic intervention in order to improve the following deficits and impairments:  Abnormal gait, Difficulty walking, Decreased range of motion, Decreased activity tolerance, Decreased balance, Decreased strength, Increased edema, Pain  Visit Diagnosis: Acute pain of left knee - Plan: PT plan of care cert/re-cert  Stiffness of left knee, not elsewhere classified - Plan: PT plan of care cert/re-cert  Localized edema - Plan: PT plan of care cert/re-cert  Muscle weakness (generalized) - Plan: PT plan of care cert/re-cert     Problem List Patient Active Problem List   Diagnosis Date Noted  . Preoperative clearance 10/23/2019  .  Family history of heart disease 10/23/2019  . Atypical chest pain 10/23/2019  . Hyperlipidemia 10/06/2019  . Smoker 10/05/2019  . Primary localized osteoarthritis of left knee 10/05/2019  . COPD, mild ([place]) 01/06/2015  . ADENOMATOUS COLONIC POLYP 02/17/2008  . GERD 02/17/2008  . BACK PAIN, CHRONIC 02/17/2008  . B12 DEFICIENCY 12/30/2007  . WEIGHT LOSS 12/30/2007    Gabriela Eves, PT, DPT 12/18/2019, 12:53 PM  [facility] [place], [place], [address] Phone: [phone]   Fax:  [phone]  Name: Wesley Schultz MRN: [id number] Date of Birth: 11/12/1950

## 2019-12-21 ENCOUNTER — Other Ambulatory Visit: Payer: Self-pay

## 2019-12-21 ENCOUNTER — Ambulatory Visit: Payer: Medicare Other | Admitting: Physical Therapy

## 2019-12-21 DIAGNOSIS — M25562 Pain in left knee: Secondary | ICD-10-CM | POA: Diagnosis not present

## 2019-12-21 DIAGNOSIS — R600 Localized edema: Secondary | ICD-10-CM

## 2019-12-21 DIAGNOSIS — M25662 Stiffness of left knee, not elsewhere classified: Secondary | ICD-10-CM

## 2019-12-21 DIAGNOSIS — M6281 Muscle weakness (generalized): Secondary | ICD-10-CM

## 2019-12-21 NOTE — Therapy (Signed)
[facility] [place], [place], [address] Phone: [phone]   Fax:  [phone]  Physical Therapy Treatment  Patient Details  Name: Wesley Schultz MRN: [id number] Date of Birth: 05/24/1950 Referring Provider (PT): Dr. Noemi Chapel, MD   Encounter Date: 12/21/2019   PT End of Session - 12/21/19 1326    Visit Number 2    Number of Visits 12    Date for PT Re-Evaluation 02/05/20    Authorization Type FOTO; progress note every 10th visit; KX modifier at 15th visit    PT Start Time 0100    PT Stop Time 0150    PT Time Calculation (min) 50 min    Activity Tolerance Patient tolerated treatment well    Behavior During Therapy [facility] for tasks assessed/performed           Past Medical History:  Diagnosis Date  . Arthritis   . Asthma   . Colon polyps   . GERD (gastroesophageal reflux disease)   . Primary localized osteoarthritis of left knee 10/05/2019    Past Surgical History:  Procedure Laterality Date  . BACK SURGERY     2008, 2009, 2010  . CATARACT EXTRACTION, BILATERAL    . CERVICAL SPINE SURGERY    . HERNIA REPAIR    . LUMBAR LAMINECTOMY      There were no vitals filed for this visit.   Subjective Assessment - 12/21/19 1305    Subjective COVID-19 screen performed prior to patient entering clinic.  No new complaints.I drove yesterday.    Pertinent History L TKA 12/16/2019, Asthma, history of lumbar surgery    Limitations Sitting;Standing;Walking;House hold activities    How long can you stand comfortably? short periods    How long can you walk comfortably? around home    Patient Stated Goals walk without AD    Currently in Pain? Yes    Pain Score 5     Pain Location Knee    Pain Orientation Left    Pain Onset In the past 7 days                             [facility] Adult PT Treatment/Exercise - 12/21/19 0001      Exercises   Exercises Knee/Hip      Knee/Hip Exercises: Aerobic   Nustep Level 1 x  15 minutes.      Vasopneumatic   Number Minutes Vasopneumatic  20 minutes    Vasopnuematic Location  --   Left knee.   Vasopneumatic Pressure Low      Manual Therapy   Manual Therapy Passive ROM    Passive ROM In supine:  PROM into patient's left knee flexion and extension x 8 minutes.                    PT Short Term Goals - 12/18/19 1229      PT SHORT TERM GOAL #1   Title Patient will ambulate with SPC with minimal gait devations and left knee pain less than or equal to 2/10.    Time 2    Period Weeks    Status New      PT SHORT TERM GOAL #2   Title Patient will demonstrate 2-90 degrees of left knee AROM to improve ability to perform functional tasks.    Time 2    Period Weeks    Status New  PT Long Term Goals - 12/18/19 1230      PT LONG TERM GOAL #1   Title Patient will be independent with advanced HEP    Time 6    Period Weeks    Status New      PT LONG TERM GOAL #2   Title Patient will demonstrate 0-115+ degrees of left knee AROM to improve ability to perform ADLs and functional tasks.    Time 6    Period Weeks    Status New      PT LONG TERM GOAL #3   Title Patient will demonstrate reciprocating stair ambulation with one rail to safely acess basement.    Time 6    Period Weeks    Status New      PT LONG TERM GOAL #4   Title Patient will demonstrate 4+/5 left knee MMT in all planes to improve stability during functional tasks.    Time 6    Period Weeks    Status New      PT LONG TERM GOAL #5   Title Patient will be independent with performing ADLs and home activities with left knee pain less than or equal to 2/10.    Time 6    Period Weeks    Status New                 Plan - 12/21/19 1347    Clinical Impression Statement The patient did great today with the addition of Nustep.  Added supine extension stretch.    Personal Factors and Comorbidities Age;Comorbidity 2    Comorbidities L TKA 12/16/2019, Asthma, history  of lumbar surgery    Examination-Activity Limitations Locomotion Level;Transfers;Stand;Stairs;Dressing    Stability/Clinical Decision Making Stable/Uncomplicated    Clinical Decision Making Low    Rehab Potential Excellent    PT Duration 6 weeks    PT Treatment/Interventions ADLs/Self Care Home Management;Cryotherapy;Electrical Stimulation;Moist Heat;Gait training;Stair training;Functional mobility training;Therapeutic activities;Therapeutic exercise;Balance training;Neuromuscular re-education;Manual techniques;Passive range of motion;Patient/family education;Scar mobilization;Vasopneumatic Device;Taping    PT Next Visit Plan measure edema if large gauze dressing was removed. nustep, AROM/AAROM/PROM to left knee; modalities PRN for pain relief; Wean from walker by post op week 2 per MD.    PT Home Exercise Plan see patient education section    Consulted and Agree with Plan of Care Patient           Patient will benefit from skilled therapeutic intervention in order to improve the following deficits and impairments:  Abnormal gait, Difficulty walking, Decreased range of motion, Decreased activity tolerance, Decreased balance, Decreased strength, Increased edema, Pain  Visit Diagnosis: Acute pain of left knee  Stiffness of left knee, not elsewhere classified  Localized edema  Muscle weakness (generalized)     Problem List Patient Active Problem List   Diagnosis Date Noted  . Preoperative clearance 10/23/2019  . Family history of heart disease 10/23/2019  . Atypical chest pain 10/23/2019  . Hyperlipidemia 10/06/2019  . Smoker 10/05/2019  . Primary localized osteoarthritis of left knee 10/05/2019  . COPD, mild ([place]) 01/06/2015  . ADENOMATOUS COLONIC POLYP 02/17/2008  . GERD 02/17/2008  . BACK PAIN, CHRONIC 02/17/2008  . B12 DEFICIENCY 12/30/2007  . WEIGHT LOSS 12/30/2007    Kaija Kovacevic, [place] MPT 12/21/2019, 2:03 PM  [facility] [place], [place], [address] Phone: [phone]   Fax:  [phone]  Name: VIVAN AGOSTINO MRN: [id number] Date of Birth: 20-Jan-1951

## 2019-12-23 ENCOUNTER — Other Ambulatory Visit: Payer: Self-pay

## 2019-12-23 ENCOUNTER — Encounter: Payer: Self-pay | Admitting: Physical Therapy

## 2019-12-23 ENCOUNTER — Ambulatory Visit: Payer: Medicare Other | Admitting: Physical Therapy

## 2019-12-23 DIAGNOSIS — R600 Localized edema: Secondary | ICD-10-CM

## 2019-12-23 DIAGNOSIS — M25562 Pain in left knee: Secondary | ICD-10-CM

## 2019-12-23 DIAGNOSIS — M25662 Stiffness of left knee, not elsewhere classified: Secondary | ICD-10-CM

## 2019-12-23 DIAGNOSIS — M6281 Muscle weakness (generalized): Secondary | ICD-10-CM

## 2019-12-23 NOTE — Therapy (Signed)
[facility] [place], [place], [address] Phone: [phone]   Fax:  [phone]  Physical Therapy Treatment  Patient Details  Name: Wesley Schultz MRN: [id number] Date of Birth: [birth year] Referring Provider (PT): Dr. Noemi Chapel, MD   Encounter Date: 12/23/2019   PT End of Session - 12/23/19 1312    Visit Number 3    Number of Visits 12    Date for PT Re-Evaluation 02/05/20    Authorization Type FOTO; progress note every 10th visit; KX modifier at 15th visit    PT Start Time 1303    PT Stop Time 1344    PT Time Calculation (min) 41 min    Equipment Utilized During Treatment Other (comment)   FWW   Activity Tolerance Patient tolerated treatment well    Behavior During Therapy [facility] for tasks assessed/performed           Past Medical History:  Diagnosis Date  . Arthritis   . Asthma   . Colon polyps   . GERD (gastroesophageal reflux disease)   . Primary localized osteoarthritis of left knee 10/05/2019    Past Surgical History:  Procedure Laterality Date  . BACK SURGERY     2008, 2009, 2010  . CATARACT EXTRACTION, BILATERAL    . CERVICAL SPINE SURGERY    . HERNIA REPAIR    . LUMBAR LAMINECTOMY      There were no vitals filed for this visit.   Subjective Assessment - 12/23/19 1310    Subjective COVID-19 screen performed prior to patient entering clinic. Reports his thigh is still very sore and tight.    Pertinent History L TKA 12/16/2019, Asthma, history of lumbar surgery    Limitations Sitting;Standing;Walking;House hold activities    How long can you stand comfortably? short periods    How long can you walk comfortably? around home    Patient Stated Goals walk without AD    Currently in Pain? Yes    Pain Score --   No pain score provided   Pain Location Knee    Pain Orientation Left    Pain Descriptors / Indicators Sore    Pain Type Surgical pain    Pain Onset 1 to 4 weeks ago    Pain Frequency Constant                [facility] PT Assessment - 12/23/19 0001      Assessment   Medical Diagnosis S/P left total knee replacement    Referring Provider (PT) Dr. Noemi Chapel, MD    Onset Date/Surgical Date 12/16/19    Next MD Visit 12/25/2019    Prior Therapy no      Precautions   Precautions Other (comment)    Precaution Comments no ultrasound      Restrictions   Weight Bearing Restrictions No                         [facility] Adult PT Treatment/Exercise - 12/23/19 0001      Knee/Hip Exercises: Aerobic   Nustep L3, seat 9-8 x10 min      Knee/Hip Exercises: Standing   Hip Flexion AROM;Left;20 reps;Knee bent    Forward Lunges Left;20 reps;2 seconds    Rocker Board 3 minutes      Knee/Hip Exercises: Supine   Short Arc Quad Sets Strengthening;Left;20 reps      Modalities   Modalities Vasopneumatic      Vasopneumatic   Number Minutes Vasopneumatic  15 minutes    Vasopnuematic Location  Knee    Vasopneumatic Pressure Medium    Vasopneumatic Temperature  34 for pain and edema                    PT Short Term Goals - 12/18/19 1229      PT SHORT TERM GOAL #1   Title Patient will ambulate with SPC with minimal gait devations and left knee pain less than or equal to 2/10.    Time 2    Period Weeks    Status New      PT SHORT TERM GOAL #2   Title Patient will demonstrate 2-90 degrees of left knee AROM to improve ability to perform functional tasks.    Time 2    Period Weeks    Status New             PT Long Term Goals - 12/18/19 1230      PT LONG TERM GOAL #1   Title Patient will be independent with advanced HEP    Time 6    Period Weeks    Status New      PT LONG TERM GOAL #2   Title Patient will demonstrate 0-115+ degrees of left knee AROM to improve ability to perform ADLs and functional tasks.    Time 6    Period Weeks    Status New      PT LONG TERM GOAL #3   Title Patient will demonstrate reciprocating stair ambulation with one rail to safely  acess basement.    Time 6    Period Weeks    Status New      PT LONG TERM GOAL #4   Title Patient will demonstrate 4+/5 left knee MMT in all planes to improve stability during functional tasks.    Time 6    Period Weeks    Status New      PT LONG TERM GOAL #5   Title Patient will be independent with performing ADLs and home activities with left knee pain less than or equal to 2/10.    Time 6    Period Weeks    Status New                 Plan - 12/23/19 1517    Clinical Impression Statement Patient presented in clinic with great ROM and is compliant with TED hose. Patient able to demonstrate great ROM with lunge and good L quad activation. Patient reports tenderness in thigh and bruising notable in posterior LLE. Patient compliant with CPM use at home as well. Normal vasopneumatic response noted following removal of the modality.    Personal Factors and Comorbidities Age;Comorbidity 2    Comorbidities L TKA 12/16/2019, Asthma, history of lumbar surgery    Examination-Activity Limitations Locomotion Level;Transfers;Stand;Stairs;Dressing    Stability/Clinical Decision Making Stable/Uncomplicated    Rehab Potential Excellent    PT Duration 6 weeks    PT Treatment/Interventions ADLs/Self Care Home Management;Cryotherapy;Electrical Stimulation;Moist Heat;Gait training;Stair training;Functional mobility training;Therapeutic activities;Therapeutic exercise;Balance training;Neuromuscular re-education;Manual techniques;Passive range of motion;Patient/family education;Scar mobilization;Vasopneumatic Device;Taping    PT Next Visit Plan measure edema if large gauze dressing was removed. nustep, AROM/AAROM/PROM to left knee; modalities PRN for pain relief; Wean from walker by post op week 2 per MD.    PT Home Exercise Plan see patient education section    Consulted and Agree with Plan of Care Patient           Patient  will benefit from skilled therapeutic intervention in order to improve  the following deficits and impairments:  Abnormal gait, Difficulty walking, Decreased range of motion, Decreased activity tolerance, Decreased balance, Decreased strength, Increased edema, Pain  Visit Diagnosis: Acute pain of left knee  Stiffness of left knee, not elsewhere classified  Localized edema  Muscle weakness (generalized)     Problem List Patient Active Problem List   Diagnosis Date Noted  . Preoperative clearance 10/23/2019  . Family history of heart disease 10/23/2019  . Atypical chest pain 10/23/2019  . Hyperlipidemia 10/06/2019  . Smoker 10/05/2019  . Primary localized osteoarthritis of left knee 10/05/2019  . COPD, mild ([place]) 01/06/2015  . ADENOMATOUS COLONIC POLYP 02/17/2008  . GERD 02/17/2008  . BACK PAIN, CHRONIC 02/17/2008  . B12 DEFICIENCY 12/30/2007  . WEIGHT LOSS 12/30/2007    Standley Brooking, PTA 12/23/2019, 4:03 PM  [facility] [address] [place], [place], [address] Phone: [phone]   Fax:  [phone]  Name: ARMONIE STATEN MRN: [id number] Date of Birth: Apr 12, 1951

## 2019-12-25 ENCOUNTER — Ambulatory Visit: Payer: Medicare Other | Admitting: Physical Therapy

## 2019-12-25 ENCOUNTER — Other Ambulatory Visit: Payer: Self-pay

## 2019-12-25 ENCOUNTER — Encounter: Payer: Self-pay | Admitting: Physical Therapy

## 2019-12-25 DIAGNOSIS — R600 Localized edema: Secondary | ICD-10-CM

## 2019-12-25 DIAGNOSIS — M25562 Pain in left knee: Secondary | ICD-10-CM

## 2019-12-25 DIAGNOSIS — M25662 Stiffness of left knee, not elsewhere classified: Secondary | ICD-10-CM

## 2019-12-25 DIAGNOSIS — M6281 Muscle weakness (generalized): Secondary | ICD-10-CM

## 2019-12-25 NOTE — Therapy (Signed)
[facility] [place], [place], [address] Phone: [phone]   Fax:  [phone]  Physical Therapy Treatment  Patient Details  Name: Wesley Schultz MRN: [id number] Date of Birth: 1950/11/24 Referring Provider (PT): Dr. Noemi Chapel, MD   Encounter Date: 12/25/2019   PT End of Session - 12/25/19 1109    Visit Number 4    Number of Visits 12    Date for PT Re-Evaluation 02/05/20    Authorization Type FOTO; progress note every 10th visit; KX modifier at 15th visit    PT Start Time 1030    PT Stop Time 1117    PT Time Calculation (min) 47 min    Activity Tolerance Patient tolerated treatment well    Behavior During Therapy [facility] for tasks assessed/performed           Past Medical History:  Diagnosis Date  . Arthritis   . Asthma   . Colon polyps   . GERD (gastroesophageal reflux disease)   . Primary localized osteoarthritis of left knee 10/05/2019    Past Surgical History:  Procedure Laterality Date  . BACK SURGERY     2008, 2009, 2010  . CATARACT EXTRACTION, BILATERAL    . CERVICAL SPINE SURGERY    . HERNIA REPAIR    . LUMBAR LAMINECTOMY      There were no vitals filed for this visit.   Subjective Assessment - 12/25/19 1100    Subjective COVID-19 screen performed prior to patient entering clinic.  Thigh still hurts.  Getting medicine for it.    Pertinent History L TKA 12/16/2019, Asthma, history of lumbar surgery    Limitations Sitting;Standing;Walking;House hold activities    How long can you stand comfortably? short periods    How long can you walk comfortably? around home    Patient Stated Goals walk without AD    Currently in Pain? Yes    Pain Score 2     Pain Location Knee    Pain Orientation Left    Pain Descriptors / Indicators Sore    Pain Type Surgical pain    Pain Onset 1 to 4 weeks ago              [facility] PT Assessment - 12/25/19 0001      PROM   Left Knee Flexion 96                          [facility] Adult PT Treatment/Exercise - 12/25/19 0001      Exercises   Exercises Knee/Hip      Knee/Hip Exercises: Aerobic   Nustep Level 3 x 15 minutes moving seat forward as tolearted to increase knee flexion.      Vasopneumatic   Number Minutes Vasopneumatic  20 minutes    Vasopnuematic Location  --   Left knee.   Vasopneumatic Pressure Low      Manual Therapy   Manual Therapy Passive ROM    Passive ROM In supine:  PROM into patient's left knee flexion and extension x 8 minutes.                    PT Short Term Goals - 12/18/19 1229      PT SHORT TERM GOAL #1   Title Patient will ambulate with SPC with minimal gait devations and left knee pain less than or equal to 2/10.    Time 2    Period Weeks  Status New      PT SHORT TERM GOAL #2   Title Patient will demonstrate 2-90 degrees of left knee AROM to improve ability to perform functional tasks.    Time 2    Period Weeks    Status New             PT Long Term Goals - 12/18/19 1230      PT LONG TERM GOAL #1   Title Patient will be independent with advanced HEP    Time 6    Period Weeks    Status New      PT LONG TERM GOAL #2   Title Patient will demonstrate 0-115+ degrees of left knee AROM to improve ability to perform ADLs and functional tasks.    Time 6    Period Weeks    Status New      PT LONG TERM GOAL #3   Title Patient will demonstrate reciprocating stair ambulation with one rail to safely acess basement.    Time 6    Period Weeks    Status New      PT LONG TERM GOAL #4   Title Patient will demonstrate 4+/5 left knee MMT in all planes to improve stability during functional tasks.    Time 6    Period Weeks    Status New      PT LONG TERM GOAL #5   Title Patient will be independent with performing ADLs and home activities with left knee pain less than or equal to 2/10.    Time 6    Period Weeks    Status New                 Plan - 12/25/19  1105    Clinical Impression Statement The patient is doing well.  Using a walking stick today.  Will be getting Aquacel off next week.  His left knee flexion has improved nicely.    Personal Factors and Comorbidities Age;Comorbidity 2    Comorbidities L TKA 12/16/2019, Asthma, history of lumbar surgery    Examination-Activity Limitations Locomotion Level;Transfers;Stand;Stairs;Dressing    Stability/Clinical Decision Making Stable/Uncomplicated    Rehab Potential Excellent    PT Duration 6 weeks    PT Treatment/Interventions ADLs/Self Care Home Management;Cryotherapy;Electrical Stimulation;Moist Heat;Gait training;Stair training;Functional mobility training;Therapeutic activities;Therapeutic exercise;Balance training;Neuromuscular re-education;Manual techniques;Passive range of motion;Patient/family education;Scar mobilization;Vasopneumatic Device;Taping    PT Next Visit Plan measure edema if large gauze dressing was removed. nustep, AROM/AAROM/PROM to left knee; modalities PRN for pain relief; Wean from walker by post op week 2 per MD.    PT Home Exercise Plan see patient education section    Consulted and Agree with Plan of Care Patient           Patient will benefit from skilled therapeutic intervention in order to improve the following deficits and impairments:  Abnormal gait, Difficulty walking, Decreased range of motion, Decreased activity tolerance, Decreased balance, Decreased strength, Increased edema, Pain  Visit Diagnosis: Acute pain of left knee  Stiffness of left knee, not elsewhere classified  Localized edema  Muscle weakness (generalized)     Problem List Patient Active Problem List   Diagnosis Date Noted  . Preoperative clearance 10/23/2019  . Family history of heart disease 10/23/2019  . Atypical chest pain 10/23/2019  . Hyperlipidemia 10/06/2019  . Smoker 10/05/2019  . Primary localized osteoarthritis of left knee 10/05/2019  . COPD, mild ([place]) 01/06/2015  .  ADENOMATOUS COLONIC POLYP 02/17/2008  . GERD 02/17/2008  .  BACK PAIN, CHRONIC 02/17/2008  . B12 DEFICIENCY 12/30/2007  . WEIGHT LOSS 12/30/2007    Euretha Najarro, [place] MPT 12/25/2019, 11:17 AM  [facility] [address] [place], [place], [address] Phone: [phone]   Fax:  [phone]  Name: AMRITPAL SHROPSHIRE MRN: [id number] Date of Birth: 13-Sep-1950

## 2019-12-28 ENCOUNTER — Other Ambulatory Visit: Payer: Self-pay

## 2019-12-28 ENCOUNTER — Encounter: Payer: Self-pay | Admitting: Physical Therapy

## 2019-12-28 ENCOUNTER — Ambulatory Visit: Payer: Medicare Other | Admitting: Physical Therapy

## 2019-12-28 DIAGNOSIS — M25562 Pain in left knee: Secondary | ICD-10-CM

## 2019-12-28 DIAGNOSIS — M6281 Muscle weakness (generalized): Secondary | ICD-10-CM

## 2019-12-28 DIAGNOSIS — M25662 Stiffness of left knee, not elsewhere classified: Secondary | ICD-10-CM

## 2019-12-28 DIAGNOSIS — R600 Localized edema: Secondary | ICD-10-CM

## 2019-12-28 NOTE — Therapy (Signed)
[facility] [place], [place], [address] Phone: [phone]   Fax:  [phone]  Physical Therapy Treatment  Patient Details  Name: Wesley Schultz MRN: [id number] Date of Birth: June 09, 1950 Referring Provider (PT): Dr. Noemi Chapel, MD   Encounter Date: 12/28/2019   PT End of Session - 12/28/19 1306    Visit Number 5    Number of Visits 12    Date for PT Re-Evaluation 02/05/20    Authorization Type FOTO; progress note every 10th visit; KX modifier at 15th visit    PT Start Time 1301    PT Stop Time 1346    PT Time Calculation (min) 45 min    Equipment Utilized During Treatment Other (comment)   walking stick   Activity Tolerance Patient tolerated treatment well    Behavior During Therapy [facility] for tasks assessed/performed           Past Medical History:  Diagnosis Date  . Arthritis   . Asthma   . Colon polyps   . GERD (gastroesophageal reflux disease)   . Primary localized osteoarthritis of left knee 10/05/2019    Past Surgical History:  Procedure Laterality Date  . BACK SURGERY     2008, 2009, 2010  . CATARACT EXTRACTION, BILATERAL    . CERVICAL SPINE SURGERY    . HERNIA REPAIR    . LUMBAR LAMINECTOMY      There were no vitals filed for this visit.   Subjective Assessment - 12/28/19 1304    Subjective COVID-19 screen performed prior to patient entering clinic. Reports he is experiencing more soreness than before. Reports [place] has [place] this [place] and drove and used the clutch.    Pertinent History L TKA 12/16/2019, Asthma, history of lumbar surgery    Limitations Sitting;Standing;Walking;House hold activities    How long can you stand comfortably? short periods    How long can you walk comfortably? around home    Patient Stated Goals walk without AD    Currently in Pain? Yes    Pain Score 5     Pain Location Knee    Pain Orientation Left    Pain Descriptors / Indicators Sore    Pain Type Surgical  pain    Pain Onset 1 to 4 weeks ago    Pain Frequency Constant              [facility] PT Assessment - 12/28/19 0001      Assessment   Medical Diagnosis S/P left total knee replacement    Referring Provider (PT) Dr. Noemi Chapel, MD    Onset Date/Surgical Date 12/16/19    Next MD Visit 12/31/2019    Prior Therapy no      Precautions   Precautions Other (comment)    Precaution Comments no ultrasound      Restrictions   Weight Bearing Restrictions No                         [facility] Adult PT Treatment/Exercise - 12/28/19 0001      Knee/Hip Exercises: Aerobic   Nustep L3, seat 9-7 x12 min      Knee/Hip Exercises: Standing   Hip Flexion AROM;Left;20 reps;Knee bent    Forward Lunges Left;2 sets;10 reps    Forward Lunges Limitations off white step on leg press    Rocker Board 2 minutes      Knee/Hip Exercises: Supine   Short Arc Quad Sets Strengthening;Left;20 reps  Modalities   Modalities Vasopneumatic      Vasopneumatic   Number Minutes Vasopneumatic  15 minutes    Vasopnuematic Location  Knee    Vasopneumatic Pressure Low    Vasopneumatic Temperature  34 for pain and edema                    PT Short Term Goals - 12/18/19 1229      PT SHORT TERM GOAL #1   Title Patient will ambulate with SPC with minimal gait devations and left knee pain less than or equal to 2/10.    Time 2    Period Weeks    Status New      PT SHORT TERM GOAL #2   Title Patient will demonstrate 2-90 degrees of left knee AROM to improve ability to perform functional tasks.    Time 2    Period Weeks    Status New             PT Long Term Goals - 12/18/19 1230      PT LONG TERM GOAL #1   Title Patient will be independent with advanced HEP    Time 6    Period Weeks    Status New      PT LONG TERM GOAL #2   Title Patient will demonstrate 0-115+ degrees of left knee AROM to improve ability to perform ADLs and functional tasks.    Time 6    Period Weeks    Status  New      PT LONG TERM GOAL #3   Title Patient will demonstrate reciprocating stair ambulation with one rail to safely acess basement.    Time 6    Period Weeks    Status New      PT LONG TERM GOAL #4   Title Patient will demonstrate 4+/5 left knee MMT in all planes to improve stability during functional tasks.    Time 6    Period Weeks    Status New      PT LONG TERM GOAL #5   Title Patient will be independent with performing ADLs and home activities with left knee pain less than or equal to 2/10.    Time 6    Period Weeks    Status New                 Plan - 12/28/19 1347    Clinical Impression Statement Patient presented in clinic with reports of greater L knee soreness. Patient utilizing walking stick for ambulation and somewhat limited with standing due to R knee soreness. Patient fatigued quickly with more standing exercises completed today and with SAQ. Normal vasopneumatic response noted following removal of the modality.    Personal Factors and Comorbidities Age;Comorbidity 2    Comorbidities L TKA 12/16/2019, Asthma, history of lumbar surgery    Examination-Activity Limitations Locomotion Level;Transfers;Stand;Stairs;Dressing    Stability/Clinical Decision Making Stable/Uncomplicated    Rehab Potential Excellent    PT Duration 6 weeks    PT Treatment/Interventions ADLs/Self Care Home Management;Cryotherapy;Electrical Stimulation;Moist Heat;Gait training;Stair training;Functional mobility training;Therapeutic activities;Therapeutic exercise;Balance training;Neuromuscular re-education;Manual techniques;Passive range of motion;Patient/family education;Scar mobilization;Vasopneumatic Device;Taping    PT Next Visit Plan measure edema if large gauze dressing was removed. nustep, AROM/AAROM/PROM to left knee; modalities PRN for pain relief; Wean from walker by post op week 2 per MD.    PT Home Exercise Plan see patient education section    Consulted and Agree with Plan of  Care Patient  Patient will benefit from skilled therapeutic intervention in order to improve the following deficits and impairments:  Abnormal gait, Difficulty walking, Decreased range of motion, Decreased activity tolerance, Decreased balance, Decreased strength, Increased edema, Pain  Visit Diagnosis: Acute pain of left knee  Stiffness of left knee, not elsewhere classified  Localized edema  Muscle weakness (generalized)     Problem List Patient Active Problem List   Diagnosis Date Noted  . Preoperative clearance 10/23/2019  . Family history of heart disease 10/23/2019  . Atypical chest pain 10/23/2019  . Hyperlipidemia 10/06/2019  . Smoker 10/05/2019  . Primary localized osteoarthritis of left knee 10/05/2019  . COPD, mild ([place]) 01/06/2015  . ADENOMATOUS COLONIC POLYP 02/17/2008  . GERD 02/17/2008  . BACK PAIN, CHRONIC 02/17/2008  . B12 DEFICIENCY 12/30/2007  . WEIGHT LOSS 12/30/2007    Standley Brooking, PTA 12/28/2019, 2:05 PM  [facility] [address] [place], [place], [address] Phone: [phone]   Fax:  [phone]  Name: Wesley Schultz MRN: [id number] Date of Birth: [birth year]

## 2019-12-30 ENCOUNTER — Ambulatory Visit: Payer: Medicare Other | Attending: Physician Assistant | Admitting: Physical Therapy

## 2019-12-30 ENCOUNTER — Other Ambulatory Visit: Payer: Self-pay

## 2019-12-30 DIAGNOSIS — M6281 Muscle weakness (generalized): Secondary | ICD-10-CM

## 2019-12-30 DIAGNOSIS — M25562 Pain in left knee: Secondary | ICD-10-CM | POA: Diagnosis not present

## 2019-12-30 DIAGNOSIS — R600 Localized edema: Secondary | ICD-10-CM | POA: Diagnosis present

## 2019-12-30 DIAGNOSIS — M25662 Stiffness of left knee, not elsewhere classified: Secondary | ICD-10-CM | POA: Insufficient documentation

## 2019-12-30 NOTE — Therapy (Signed)
[facility] [place], [place], [address] Phone: [phone]   Fax:  [phone]  Physical Therapy Treatment  Patient Details  Name: Wesley Schultz MRN: [id number] Date of Birth: 25-Oct-1950 Referring Provider (PT): Dr. Noemi Chapel, MD   Encounter Date: 12/30/2019   PT End of Session - 12/30/19 1341    Visit Number 6    Number of Visits 12    Date for PT Re-Evaluation 02/05/20    Authorization Type FOTO; progress note every 10th visit; KX modifier at 15th visit    PT Start Time 0100    PT Stop Time 0149    PT Time Calculation (min) 49 min    Activity Tolerance Patient tolerated treatment well    Behavior During Therapy [facility] for tasks assessed/performed           Past Medical History:  Diagnosis Date  . Arthritis   . Asthma   . Colon polyps   . GERD (gastroesophageal reflux disease)   . Primary localized osteoarthritis of left knee 10/05/2019    Past Surgical History:  Procedure Laterality Date  . BACK SURGERY     2008, 2009, 2010  . CATARACT EXTRACTION, BILATERAL    . CERVICAL SPINE SURGERY    . HERNIA REPAIR    . LUMBAR LAMINECTOMY      There were no vitals filed for this visit.   Subjective Assessment - 12/30/19 1307    Subjective COVID-19 screen performed prior to patient entering clinic.  Going to doctor today.    Pertinent History L TKA 12/16/2019, Asthma, history of lumbar surgery    Limitations Sitting;Standing;Walking;House hold activities    How long can you stand comfortably? short periods    How long can you walk comfortably? around home    Patient Stated Goals walk without AD    Currently in Pain? Yes    Pain Score 5     Pain Location Knee    Pain Orientation Left    Pain Descriptors / Indicators Sore    Pain Type Surgical pain    Pain Onset 1 to 4 weeks ago              [facility] PT Assessment - 12/30/19 0001      PROM   Left Knee Flexion 105                          [facility]  Adult PT Treatment/Exercise - 12/30/19 0001      Exercises   Exercises Knee/Hip      Knee/Hip Exercises: Aerobic   Nustep Level 3 x 15 minutes.      Vasopneumatic   Number Minutes Vasopneumatic  20 minutes    Vasopnuematic Location  --   Left knee.   Vasopneumatic Pressure Low      Manual Therapy   Manual Therapy Passive ROM    Passive ROM In supine:  PROM to patient's left knee x 8 minutes.                    PT Short Term Goals - 12/18/19 1229      PT SHORT TERM GOAL #1   Title Patient will ambulate with SPC with minimal gait devations and left knee pain less than or equal to 2/10.    Time 2    Period Weeks    Status New      PT SHORT TERM GOAL #2   Title  Patient will demonstrate 2-90 degrees of left knee AROM to improve ability to perform functional tasks.    Time 2    Period Weeks    Status New             PT Long Term Goals - 12/18/19 1230      PT LONG TERM GOAL #1   Title Patient will be independent with advanced HEP    Time 6    Period Weeks    Status New      PT LONG TERM GOAL #2   Title Patient will demonstrate 0-115+ degrees of left knee AROM to improve ability to perform ADLs and functional tasks.    Time 6    Period Weeks    Status New      PT LONG TERM GOAL #3   Title Patient will demonstrate reciprocating stair ambulation with one rail to safely acess basement.    Time 6    Period Weeks    Status New      PT LONG TERM GOAL #4   Title Patient will demonstrate 4+/5 left knee MMT in all planes to improve stability during functional tasks.    Time 6    Period Weeks    Status New      PT LONG TERM GOAL #5   Title Patient will be independent with performing ADLs and home activities with left knee pain less than or equal to 2/10.    Time 6    Period Weeks    Status New                 Plan - 12/30/19 1336    Clinical Impression Statement Excellent improvement with left knee passive flexion to 105 degrees today.     Personal Factors and Comorbidities Age;Comorbidity 2    Comorbidities L TKA 12/16/2019, Asthma, history of lumbar surgery    Examination-Activity Limitations Locomotion Level;Transfers;Stand;Stairs;Dressing    Stability/Clinical Decision Making Stable/Uncomplicated    Rehab Potential Excellent    PT Duration 6 weeks    PT Treatment/Interventions ADLs/Self Care Home Management;Cryotherapy;Electrical Stimulation;Moist Heat;Gait training;Stair training;Functional mobility training;Therapeutic activities;Therapeutic exercise;Balance training;Neuromuscular re-education;Manual techniques;Passive range of motion;Patient/family education;Scar mobilization;Vasopneumatic Device;Taping    PT Next Visit Plan measure edema if large gauze dressing was removed. nustep, AROM/AAROM/PROM to left knee; modalities PRN for pain relief; Wean from walker by post op week 2 per MD.    PT Home Exercise Plan see patient education section    Consulted and Agree with Plan of Care Patient           Patient will benefit from skilled therapeutic intervention in order to improve the following deficits and impairments:  Abnormal gait, Difficulty walking, Decreased range of motion, Decreased activity tolerance, Decreased balance, Decreased strength, Increased edema, Pain  Visit Diagnosis: Acute pain of left knee  Stiffness of left knee, not elsewhere classified  Localized edema  Muscle weakness (generalized)     Problem List Patient Active Problem List   Diagnosis Date Noted  . Preoperative clearance 10/23/2019  . Family history of heart disease 10/23/2019  . Atypical chest pain 10/23/2019  . Hyperlipidemia 10/06/2019  . Smoker 10/05/2019  . Primary localized osteoarthritis of left knee 10/05/2019  . COPD, mild ([place]) 01/06/2015  . ADENOMATOUS COLONIC POLYP 02/17/2008  . GERD 02/17/2008  . BACK PAIN, CHRONIC 02/17/2008  . B12 DEFICIENCY 12/30/2007  . WEIGHT LOSS 12/30/2007    Jawaun Celmer, [place] MPT 12/30/2019,  1:53 PM  [facility]  [place], [place], [address] Phone: [phone]   Fax:  [phone]  Name: Wesley Schultz MRN: [id number] Date of Birth: [birth year]

## 2020-01-01 ENCOUNTER — Ambulatory Visit: Payer: Medicare Other | Admitting: Physical Therapy

## 2020-01-01 ENCOUNTER — Other Ambulatory Visit: Payer: Self-pay

## 2020-01-01 DIAGNOSIS — M25562 Pain in left knee: Secondary | ICD-10-CM

## 2020-01-01 DIAGNOSIS — R600 Localized edema: Secondary | ICD-10-CM

## 2020-01-01 DIAGNOSIS — M6281 Muscle weakness (generalized): Secondary | ICD-10-CM

## 2020-01-01 DIAGNOSIS — M25662 Stiffness of left knee, not elsewhere classified: Secondary | ICD-10-CM

## 2020-01-01 NOTE — Therapy (Signed)
[facility] [place], [place], [address] Phone: [phone]   Fax:  [phone]  Physical Therapy Treatment  Patient Details  Name: Wesley Schultz MRN: [id number] Date of Birth: 10-Nov-1950 Referring Provider (PT): Dr. Noemi Chapel, MD   Encounter Date: 01/01/2020   PT End of Session - 01/01/20 1150    Visit Number 7    Number of Visits 12    Date for PT Re-Evaluation 02/05/20    Authorization Type FOTO; progress note every 10th visit; KX modifier at 15th visit    PT Start Time 1030    PT Stop Time 1123    PT Time Calculation (min) 53 min    Activity Tolerance Patient tolerated treatment well    Behavior During Therapy [facility] for tasks assessed/performed           Past Medical History:  Diagnosis Date  . Arthritis   . Asthma   . Colon polyps   . GERD (gastroesophageal reflux disease)   . Primary localized osteoarthritis of left knee 10/05/2019    Past Surgical History:  Procedure Laterality Date  . BACK SURGERY     2008, 2009, 2010  . CATARACT EXTRACTION, BILATERAL    . CERVICAL SPINE SURGERY    . HERNIA REPAIR    . LUMBAR LAMINECTOMY      There were no vitals filed for this visit.   Subjective Assessment - 01/01/20 1146    Subjective COVID-19 screen performed prior to patient entering clinic.  Doctor was pleased.    Pertinent History L TKA 12/16/2019, Asthma, history of lumbar surgery    Limitations Sitting;Standing;Walking;House hold activities    How long can you stand comfortably? short periods    Currently in Pain? Yes    Pain Score 5     Pain Location Knee    Pain Orientation Left    Pain Descriptors / Indicators Sore    Pain Type Surgical pain    Pain Onset 1 to 4 weeks ago                             [facility] Adult PT Treatment/Exercise - 01/01/20 0001      Exercises   Exercises Knee/Hip      Knee/Hip Exercises: Aerobic   Nustep Level 3 moving seat forward as tolerated to increase  knee flexion x 15 minutes.      Knee/Hip Exercises: Supine   Short Arc Quad Sets Limitations SAQ's x 15 minutes facilitated with VMS to patient's left quadriceps with 10 sec extension holds f/b a 10 sec rest.      Modalities   Modalities Electrical Stimulation;Vasopneumatic      Electrical Stimulation   Electrical Stimulation Location Left knee    Electrical Stimulation Action IFC    Electrical Stimulation Parameters 80-150 Hz x 15 minutes.    Electrical Stimulation Goals Edema;Pain      Vasopneumatic   Number Minutes Vasopneumatic  15 minutes    Vasopnuematic Location  --   Left knee.   Vasopneumatic Pressure Low                    PT Short Term Goals - 12/18/19 1229      PT SHORT TERM GOAL #1   Title Patient will ambulate with SPC with minimal gait devations and left knee pain less than or equal to 2/10.    Time 2    Period Weeks  Status New      PT SHORT TERM GOAL #2   Title Patient will demonstrate 2-90 degrees of left knee AROM to improve ability to perform functional tasks.    Time 2    Period Weeks    Status New             PT Long Term Goals - 12/18/19 1230      PT LONG TERM GOAL #1   Title Patient will be independent with advanced HEP    Time 6    Period Weeks    Status New      PT LONG TERM GOAL #2   Title Patient will demonstrate 0-115+ degrees of left knee AROM to improve ability to perform ADLs and functional tasks.    Time 6    Period Weeks    Status New      PT LONG TERM GOAL #3   Title Patient will demonstrate reciprocating stair ambulation with one rail to safely acess basement.    Time 6    Period Weeks    Status New      PT LONG TERM GOAL #4   Title Patient will demonstrate 4+/5 left knee MMT in all planes to improve stability during functional tasks.    Time 6    Period Weeks    Status New      PT LONG TERM GOAL #5   Title Patient will be independent with performing ADLs and home activities with left knee pain less  than or equal to 2/10.    Time 6    Period Weeks    Status New                 Plan - 01/01/20 1151    Clinical Impression Statement Patient did well with tretament today.  Very good left quadriceps activation with VMS today.  Patient should be ready to progress to bike next week.    Personal Factors and Comorbidities Age;Comorbidity 2    Comorbidities L TKA 12/16/2019, Asthma, history of lumbar surgery    Examination-Activity Limitations Locomotion Level;Transfers;Stand;Stairs;Dressing    Stability/Clinical Decision Making Stable/Uncomplicated    Rehab Potential Excellent    PT Duration 6 weeks    PT Treatment/Interventions ADLs/Self Care Home Management;Cryotherapy;Electrical Stimulation;Moist Heat;Gait training;Stair training;Functional mobility training;Therapeutic activities;Therapeutic exercise;Balance training;Neuromuscular re-education;Manual techniques;Passive range of motion;Patient/family education;Scar mobilization;Vasopneumatic Device;Taping    PT Next Visit Plan measure edema if large gauze dressing was removed. nustep, AROM/AAROM/PROM to left knee; modalities PRN for pain relief; Wean from walker by post op week 2 per MD.    PT Home Exercise Plan see patient education section    Consulted and Agree with Plan of Care Patient           Patient will benefit from skilled therapeutic intervention in order to improve the following deficits and impairments:  Abnormal gait, Difficulty walking, Decreased range of motion, Decreased activity tolerance, Decreased balance, Decreased strength, Increased edema, Pain  Visit Diagnosis: Acute pain of left knee  Stiffness of left knee, not elsewhere classified  Localized edema  Muscle weakness (generalized)     Problem List Patient Active Problem List   Diagnosis Date Noted  . Preoperative clearance 10/23/2019  . Family history of heart disease 10/23/2019  . Atypical chest pain 10/23/2019  . Hyperlipidemia 10/06/2019  .  Smoker 10/05/2019  . Primary localized osteoarthritis of left knee 10/05/2019  . COPD, mild ([place]) 01/06/2015  . ADENOMATOUS COLONIC POLYP 02/17/2008  . GERD 02/17/2008  .  BACK PAIN, CHRONIC 02/17/2008  . B12 DEFICIENCY 12/30/2007  . WEIGHT LOSS 12/30/2007    Szymon Foiles, [place] MPT 01/01/2020, 11:53 AM  [facility] [address] [place], [place], [address] Phone: [phone]   Fax:  [phone]  Name: MYKA LUKINS MRN: [id number] Date of Birth: 1951-01-09

## 2020-01-06 ENCOUNTER — Ambulatory Visit: Payer: Medicare Other | Admitting: Physical Therapy

## 2020-01-06 ENCOUNTER — Other Ambulatory Visit: Payer: Self-pay

## 2020-01-06 ENCOUNTER — Encounter: Payer: Self-pay | Admitting: Physical Therapy

## 2020-01-06 DIAGNOSIS — M25562 Pain in left knee: Secondary | ICD-10-CM | POA: Diagnosis not present

## 2020-01-06 DIAGNOSIS — R600 Localized edema: Secondary | ICD-10-CM

## 2020-01-06 DIAGNOSIS — M6281 Muscle weakness (generalized): Secondary | ICD-10-CM

## 2020-01-06 DIAGNOSIS — M25662 Stiffness of left knee, not elsewhere classified: Secondary | ICD-10-CM

## 2020-01-06 NOTE — Therapy (Signed)
[facility] [place], [place], [address] Phone: [phone]   Fax:  [phone]  Physical Therapy Treatment  Patient Details  Name: Wesley Schultz MRN: [id number] Date of Birth: Jun 03, 1950 Referring Provider (PT): Dr. Noemi Chapel, MD    Encounter Date: 01/06/2020   PT End of Session - 01/06/20 1340    Visit Number 8    Number of Visits 12    Date for PT Re-Evaluation 02/05/20    Authorization Type FOTO; progress note every 10th visit; KX modifier at 15th visit    PT Start Time 1300    PT Stop Time 1348    PT Time Calculation (min) 48 min    Equipment Utilized During Treatment Other (comment)   walking stick   Activity Tolerance Patient tolerated treatment well    Behavior During Therapy [facility] for tasks assessed/performed           Past Medical History:  Diagnosis Date  . Arthritis   . Asthma   . Colon polyps   . GERD (gastroesophageal reflux disease)   . Primary localized osteoarthritis of left knee 10/05/2019    Past Surgical History:  Procedure Laterality Date  . BACK SURGERY     2008, 2009, 2010  . CATARACT EXTRACTION, BILATERAL    . CERVICAL SPINE SURGERY    . HERNIA REPAIR    . LUMBAR LAMINECTOMY      There were no vitals filed for this visit.   Subjective Assessment - 01/06/20 1316    Subjective COVID-19 screen performed prior to patient entering clinic.  Patient arrives with more pain in the knee but states he woke up sleeping on his stomach.    Pertinent History L TKA 12/16/2019, Asthma, history of lumbar surgery    Limitations Sitting;Standing;Walking;House hold activities    How long can you stand comfortably? short periods    How long can you walk comfortably? around home    Patient Stated Goals walk without AD    Currently in Pain? Yes   did not provide number on pain scale             [facility] PT Assessment - 01/06/20 0001      Assessment   Medical Diagnosis S/P left total knee replacement     Referring Provider (PT) Dr. Noemi Chapel, MD    Onset Date/Surgical Date 12/16/19    Next MD Visit 01/07/2020    Prior Therapy no      Precautions   Precautions Other (comment)    Precaution Comments no ultrasound      Restrictions   Weight Bearing Restrictions No      AROM   Left Knee Extension 4    Left Knee Flexion 110      PROM   Left Knee Extension 2    Left Knee Flexion 115                         [facility] Adult PT Treatment/Exercise - 01/06/20 0001      Exercises   Exercises Knee/Hip      Knee/Hip Exercises: Stretches   Passive Hamstring Stretch Left;3 reps;30 seconds      Knee/Hip Exercises: Aerobic   Recumbent Bike Seat 7 x43mns full revolutions    Nustep Level 3x10 minutes; seat 8 to 6      Knee/Hip Exercises: Standing   Forward Step Up Left;2 sets;10 reps;Hand Hold: 2;Step Height: 6"    Step Down Left;2 sets;10 reps;Hand  Hold: 2;Step Height: 4"      Modalities   Modalities Vasopneumatic      Vasopneumatic   Number Minutes Vasopneumatic  10 minutes    Vasopnuematic Location  Knee    Vasopneumatic Pressure Low    Vasopneumatic Temperature  34      Manual Therapy   Manual Therapy Passive ROM    Passive ROM left knee PROM into flexion and extension; intermittent oscillations to prevent muscle guarding and to decrease pain                  PT Education - 01/06/20 1349    Education Details prone hang and heel prop for extension    Person(s) Educated Patient    Methods Explanation;Demonstration;Handout    Comprehension Verbalized understanding;Returned demonstration            PT Short Term Goals - 01/06/20 1351      PT SHORT TERM GOAL #1   Title Patient will ambulate with SPC with minimal gait devations and left knee pain less than or equal to 2/10.    Time 2    Period Weeks    Status Achieved      PT SHORT TERM GOAL #2   Title Patient will demonstrate 2-90 degrees of left knee AROM to improve ability to perform functional tasks.     Time 2    Period Weeks    Status Partially Met             PT Long Term Goals - 01/06/20 1354      PT LONG TERM GOAL #1   Title Patient will be independent with advanced HEP    Time 6    Period Weeks    Status On-going      PT LONG TERM GOAL #2   Title Patient will demonstrate 0-115+ degrees of left knee AROM to improve ability to perform ADLs and functional tasks.    Time 6    Period Weeks    Status On-going      PT LONG TERM GOAL #3   Title Patient will demonstrate reciprocating stair ambulation with one rail to safely acess basement.    Time 6    Period Weeks    Status On-going      PT LONG TERM GOAL #4   Title Patient will demonstrate 4+/5 left knee MMT in all planes to improve stability during functional tasks.    Time 6    Period Weeks    Status On-going      PT LONG TERM GOAL #5   Title Patient will be independent with performing ADLs and home activities with left knee pain less than or equal to 2/10.    Time 6    Period Weeks    Status On-going                 Plan - 01/06/20 1341    Clinical Impression Statement Patient responded well to therapy session with addition of TEs. Patient was able to demonstrate good form with all TEs after explanation. Patient has made improvements with AROM and PROM see measurements. Normal response to modalities upon removal.    Personal Factors and Comorbidities Age;Comorbidity 2    Comorbidities L TKA 12/16/2019, Asthma, history of lumbar surgery    Examination-Activity Limitations Locomotion Level;Transfers;Stand;Stairs;Dressing    Stability/Clinical Decision Making Stable/Uncomplicated    Clinical Decision Making Low    Rehab Potential Excellent    PT Duration 6 weeks  PT Treatment/Interventions ADLs/Self Care Home Management;Cryotherapy;Electrical Stimulation;Moist Heat;Gait training;Stair training;Functional mobility training;Therapeutic activities;Therapeutic exercise;Balance training;Neuromuscular  re-education;Manual techniques;Passive range of motion;Patient/family education;Scar mobilization;Vasopneumatic Device;Taping    PT Next Visit Plan nustep and bike, AROM/AAROM/PROM to left knee; gait training for normalized pattern. modalities PRN for pain relief    PT Home Exercise Plan see patient education section    Consulted and Agree with Plan of Care Patient           Patient will benefit from skilled therapeutic intervention in order to improve the following deficits and impairments:  Abnormal gait, Difficulty walking, Decreased range of motion, Decreased activity tolerance, Decreased balance, Decreased strength, Increased edema, Pain  Visit Diagnosis: Acute pain of left knee  Stiffness of left knee, not elsewhere classified  Localized edema  Muscle weakness (generalized)     Problem List Patient Active Problem List   Diagnosis Date Noted  . Preoperative clearance 10/23/2019  . Family history of heart disease 10/23/2019  . Atypical chest pain 10/23/2019  . Hyperlipidemia 10/06/2019  . Smoker 10/05/2019  . Primary localized osteoarthritis of left knee 10/05/2019  . COPD, mild ([place]) 01/06/2015  . ADENOMATOUS COLONIC POLYP 02/17/2008  . GERD 02/17/2008  . BACK PAIN, CHRONIC 02/17/2008  . B12 DEFICIENCY 12/30/2007  . WEIGHT LOSS 12/30/2007    Gabriela Eves, PT, DPT 01/06/2020, 2:51 PM  [facility] [address] [place], [place], [address] Phone: [phone]   Fax:  [phone]  Name: Wesley Schultz MRN: [id number] Date of Birth: July 19, 1950

## 2020-01-08 ENCOUNTER — Ambulatory Visit: Payer: Medicare Other | Admitting: Physical Therapy

## 2020-01-08 ENCOUNTER — Other Ambulatory Visit: Payer: Self-pay

## 2020-01-08 DIAGNOSIS — M6281 Muscle weakness (generalized): Secondary | ICD-10-CM

## 2020-01-08 DIAGNOSIS — R600 Localized edema: Secondary | ICD-10-CM

## 2020-01-08 DIAGNOSIS — M25562 Pain in left knee: Secondary | ICD-10-CM

## 2020-01-08 DIAGNOSIS — M25662 Stiffness of left knee, not elsewhere classified: Secondary | ICD-10-CM

## 2020-01-08 NOTE — Therapy (Signed)
[facility] [address] [place], [place], [address] Phone: [phone]   Fax:  [phone]  Physical Therapy Treatment  Patient Details  Name: Wesley Schultz MRN: [id number] Date of Birth: June 18, 1950 Referring Provider (PT): Dr. Thurston Hole, MD   Encounter Date: 01/08/2020   PT End of Session - 01/08/20 1111    Visit Number 9    Number of Visits 12    Date for PT Re-Evaluation 02/05/20    Authorization Type FOTO; progress note every 10th visit; KX modifier at 15th visit    PT Start Time 1030    PT Stop Time 1119    PT Time Calculation (min) 49 min    Activity Tolerance Patient tolerated treatment well    Behavior During Therapy [facility] for tasks assessed/performed           Past Medical History:  Diagnosis Date  . Arthritis   . Asthma   . Colon polyps   . GERD (gastroesophageal reflux disease)   . Primary localized osteoarthritis of left knee 10/05/2019    Past Surgical History:  Procedure Laterality Date  . BACK SURGERY     2008, 2009, 2010  . CATARACT EXTRACTION, BILATERAL    . CERVICAL SPINE SURGERY    . HERNIA REPAIR    . LUMBAR LAMINECTOMY      There were no vitals filed for this visit.   Subjective Assessment - 01/08/20 1106    Subjective COVID-19 screen performed prior to patient entering clinic.  Real sore after last tretament.    Pertinent History L TKA 12/16/2019, Asthma, history of lumbar surgery    Limitations Sitting;Standing;Walking;House hold activities    How long can you stand comfortably? short periods    How long can you walk comfortably? around home    Patient Stated Goals walk without AD    Pain Location Knee    Pain Orientation Left    Pain Descriptors / Indicators Sore    Pain Type Surgical pain    Pain Onset 1 to 4 weeks ago              [facility] PT Assessment - 01/08/20 0001      PROM   Left Knee Flexion 115                         [facility] Adult PT Treatment/Exercise - 01/08/20  0001      Exercises   Exercises Knee/Hip      Knee/Hip Exercises: Aerobic   Recumbent Bike 7 minutes.    Nustep 10 minutes.      Programme researcher, broadcasting/film/video Location left knee.    Electrical Stimulation Action IFC    Electrical Stimulation Parameters 80-150 Hz x15 minutes.    Electrical Stimulation Goals Edema;Pain      Vasopneumatic   Number Minutes Vasopneumatic  15 minutes    Vasopnuematic Location  --   Left knee.   Vasopneumatic Pressure Low      Manual Therapy   Manual Therapy Passive ROM    Passive ROM PROM x 6 minutes to patient's left knee into flexion and extension.                    PT Short Term Goals - 01/06/20 1351      PT SHORT TERM GOAL #1   Title Patient will ambulate with SPC with minimal gait devations and left knee pain less than or equal to  2/10.    Time 2    Period Weeks    Status Achieved      PT SHORT TERM GOAL #2   Title Patient will demonstrate 2-90 degrees of left knee AROM to improve ability to perform functional tasks.    Time 2    Period Weeks    Status Partially Met             PT Long Term Goals - 01/06/20 1354      PT LONG TERM GOAL #1   Title Patient will be independent with advanced HEP    Time 6    Period Weeks    Status On-going      PT LONG TERM GOAL #2   Title Patient will demonstrate 0-115+ degrees of left knee AROM to improve ability to perform ADLs and functional tasks.    Time 6    Period Weeks    Status On-going      PT LONG TERM GOAL #3   Title Patient will demonstrate reciprocating stair ambulation with one rail to safely acess basement.    Time 6    Period Weeks    Status On-going      PT LONG TERM GOAL #4   Title Patient will demonstrate 4+/5 left knee MMT in all planes to improve stability during functional tasks.    Time 6    Period Weeks    Status On-going      PT LONG TERM GOAL #5   Title Patient will be independent with performing ADLs and home activities with  left knee pain less than or equal to 2/10.    Time 6    Period Weeks    Status On-going                 Plan - 01/08/20 1112    Clinical Impression Statement Patient sore from last tretament but he is progressing very well.  passive left knee flexion to 115 degrees today.    Personal Factors and Comorbidities Age;Comorbidity 2    Comorbidities L TKA 12/16/2019, Asthma, history of lumbar surgery    Examination-Activity Limitations Locomotion Level;Transfers;Stand;Stairs;Dressing    Stability/Clinical Decision Making Stable/Uncomplicated    Rehab Potential Excellent    PT Duration 6 weeks    PT Treatment/Interventions ADLs/Self Care Home Management;Cryotherapy;Electrical Stimulation;Moist Heat;Gait training;Stair training;Functional mobility training;Therapeutic activities;Therapeutic exercise;Balance training;Neuromuscular re-education;Manual techniques;Passive range of motion;Patient/family education;Scar mobilization;Vasopneumatic Device;Taping    PT Next Visit Plan nustep and bike, AROM/AAROM/PROM to left knee; gait training for normalized pattern. modalities PRN for pain relief    PT Home Exercise Plan see patient education section    Consulted and Agree with Plan of Care Patient           Patient will benefit from skilled therapeutic intervention in order to improve the following deficits and impairments:  Abnormal gait, Difficulty walking, Decreased range of motion, Decreased activity tolerance, Decreased balance, Decreased strength, Increased edema, Pain  Visit Diagnosis: Acute pain of left knee  Stiffness of left knee, not elsewhere classified  Localized edema  Muscle weakness (generalized)     Problem List Patient Active Problem List   Diagnosis Date Noted  . Preoperative clearance 10/23/2019  . Family history of heart disease 10/23/2019  . Atypical chest pain 10/23/2019  . Hyperlipidemia 10/06/2019  . Smoker 10/05/2019  . Primary localized osteoarthritis  of left knee 10/05/2019  . COPD, mild ([place]) 01/06/2015  . ADENOMATOUS COLONIC POLYP 02/17/2008  . GERD 02/17/2008  . BACK  PAIN, CHRONIC 02/17/2008  . B12 DEFICIENCY 12/30/2007  . WEIGHT LOSS 12/30/2007    Denishia Citro, [place] MPT 01/08/2020, 11:20 AM  [facility] [place], [place], [address] Phone: [phone]   Fax:  [phone]  Name: Wesley Schultz MRN: [id number] Date of Birth: Sep 25, 1950

## 2020-01-11 ENCOUNTER — Encounter: Payer: Self-pay | Admitting: Physical Therapy

## 2020-01-11 ENCOUNTER — Ambulatory Visit: Payer: Medicare Other | Admitting: Physical Therapy

## 2020-01-11 ENCOUNTER — Other Ambulatory Visit: Payer: Self-pay

## 2020-01-11 DIAGNOSIS — R600 Localized edema: Secondary | ICD-10-CM

## 2020-01-11 DIAGNOSIS — M25662 Stiffness of left knee, not elsewhere classified: Secondary | ICD-10-CM

## 2020-01-11 DIAGNOSIS — M25562 Pain in left knee: Secondary | ICD-10-CM | POA: Diagnosis not present

## 2020-01-11 DIAGNOSIS — M6281 Muscle weakness (generalized): Secondary | ICD-10-CM

## 2020-01-11 NOTE — Therapy (Signed)
[facility] [place], [place], [address] Phone: [phone]   Fax:  [phone]  Physical Therapy Treatment  Patient Details  Name: Wesley Schultz MRN: [id number] Date of Birth: 1951-04-06 Referring Provider (PT): Dr. Noemi Chapel, MD   Encounter Date: 01/11/2020   PT End of Session - 01/11/20 1349    Visit Number 10    Number of Visits 12    Date for PT Re-Evaluation 02/05/20    Authorization Type FOTO; progress note every 10th visit; KX modifier at 15th visit    PT Start Time 1344    PT Stop Time 1433    PT Time Calculation (min) 49 min    Activity Tolerance Patient tolerated treatment well    Behavior During Therapy [facility] for tasks assessed/performed           Past Medical History:  Diagnosis Date  . Arthritis   . Asthma   . Colon polyps   . GERD (gastroesophageal reflux disease)   . Primary localized osteoarthritis of left knee 10/05/2019    Past Surgical History:  Procedure Laterality Date  . BACK SURGERY     2008, 2009, 2010  . CATARACT EXTRACTION, BILATERAL    . CERVICAL SPINE SURGERY    . HERNIA REPAIR    . LUMBAR LAMINECTOMY      There were no vitals filed for this visit.   Subjective Assessment - 01/11/20 1349    Subjective COVID-19 screen performed prior to patient entering clinic.  Reports more pain in R knee now.    Pertinent History L TKA 12/16/2019, Asthma, history of lumbar surgery    Limitations Sitting;Standing;Walking;House hold activities    How long can you stand comfortably? short periods    How long can you walk comfortably? around home    Patient Stated Goals walk without AD    Currently in Pain? Yes   R knee pain worse             [facility] PT Assessment - 01/11/20 0001      Assessment   Medical Diagnosis S/P left total knee replacement    Referring Provider (PT) Dr. Noemi Chapel, MD    Onset Date/Surgical Date 12/16/19    Prior Therapy no      Precautions   Precautions Other (comment)      Precaution Comments no ultrasound      Restrictions   Weight Bearing Restrictions No      ROM / Strength   AROM / PROM / Strength AROM      AROM   Overall AROM  Within functional limits for tasks performed    AROM Assessment Site Knee    Right/Left Knee Left    Left Knee Extension 6    Left Knee Flexion 120                         [facility] Adult PT Treatment/Exercise - 01/11/20 0001      Knee/Hip Exercises: Aerobic   Recumbent Bike L3, seat 7 x9 min    Nustep L4, seat 9 x10 min      Knee/Hip Exercises: Standing   Forward Lunges Left;2 sets;10 reps    Forward Lunges Limitations off white step on leg press      Knee/Hip Exercises: Supine   Short Arc Quad Sets Strengthening;Left;20 reps      Modalities   Modalities Vasopneumatic      Vasopneumatic   Number Minutes Vasopneumatic  15  minutes    Vasopnuematic Location  Knee    Vasopneumatic Pressure Low    Vasopneumatic Temperature  34      Manual Therapy   Manual Therapy Passive ROM    Passive ROM PROM of L knee into flexion, extension with holds at end range                    PT Short Term Goals - 01/11/20 1432      PT SHORT TERM GOAL #1   Title Patient will ambulate with SPC with minimal gait devations and left knee pain less than or equal to 2/10.    Time 2    Period Weeks    Status Achieved      PT SHORT TERM GOAL #2   Title Patient will demonstrate 2-90 degrees of left knee AROM to improve ability to perform functional tasks.    Time 2    Period Weeks    Status Partially Met             PT Long Term Goals - 01/11/20 1431      PT LONG TERM GOAL #1   Title Patient will be independent with advanced HEP    Time 6    Period Weeks    Status On-going      PT LONG TERM GOAL #2   Title Patient will demonstrate 0-115+ degrees of left knee AROM to improve ability to perform ADLs and functional tasks.    Time 6    Period Weeks    Status Partially Met      PT LONG TERM GOAL #3    Title Patient will demonstrate reciprocating stair ambulation with one rail to safely acess basement.    Time 6    Period Weeks    Status On-going      PT LONG TERM GOAL #4   Title Patient will demonstrate 4+/5 left knee MMT in all planes to improve stability during functional tasks.    Time 6    Period Weeks    Status On-going      PT LONG TERM GOAL #5   Title Patient will be independent with performing ADLs and home activities with left knee pain less than or equal to 2/10.    Time 6    Period Weeks    Status On-going                 Plan - 01/11/20 1439    Clinical Impression Statement Patient presented in clinic with reports of greater R knee pain which limits him. Patient more active with walking but patient knows he should probably walk more. Compliant with icing and elevation. Patient notes R knee buckling occurs and scares him. AROM of L knee measured as 6-120 deg. Normal vasopneumatic response noted following removal of the modalities.    Personal Factors and Comorbidities Age;Comorbidity 2    Comorbidities L TKA 12/16/2019, Asthma, history of lumbar surgery    Examination-Activity Limitations Locomotion Level;Transfers;Stand;Stairs;Dressing    Stability/Clinical Decision Making Stable/Uncomplicated    Rehab Potential Excellent    PT Duration 6 weeks    PT Treatment/Interventions ADLs/Self Care Home Management;Cryotherapy;Electrical Stimulation;Moist Heat;Gait training;Stair training;Functional mobility training;Therapeutic activities;Therapeutic exercise;Balance training;Neuromuscular re-education;Manual techniques;Passive range of motion;Patient/family education;Scar mobilization;Vasopneumatic Device;Taping    PT Next Visit Plan nustep and bike, AROM/AAROM/PROM to left knee; gait training for normalized pattern. modalities PRN for pain relief    PT Home Exercise Plan see patient education section  Consulted and Agree with Plan of Care Patient            Patient will benefit from skilled therapeutic intervention in order to improve the following deficits and impairments:  Abnormal gait, Difficulty walking, Decreased range of motion, Decreased activity tolerance, Decreased balance, Decreased strength, Increased edema, Pain  Visit Diagnosis: Acute pain of left knee  Stiffness of left knee, not elsewhere classified  Localized edema  Muscle weakness (generalized)     Problem List Patient Active Problem List   Diagnosis Date Noted  . Preoperative clearance 10/23/2019  . Family history of heart disease 10/23/2019  . Atypical chest pain 10/23/2019  . Hyperlipidemia 10/06/2019  . Smoker 10/05/2019  . Primary localized osteoarthritis of left knee 10/05/2019  . COPD, mild ([place]) 01/06/2015  . ADENOMATOUS COLONIC POLYP 02/17/2008  . GERD 02/17/2008  . BACK PAIN, CHRONIC 02/17/2008  . B12 DEFICIENCY 12/30/2007  . WEIGHT LOSS 12/30/2007    Standley Brooking, PTA 01/11/2020, 2:53 PM  [facility] [address] [place], [place], [address] Phone: [phone]   Fax:  [phone]  Name: Wesley Schultz MRN: [id number] Date of Birth: 26-Sep-1950

## 2020-01-13 ENCOUNTER — Other Ambulatory Visit: Payer: Self-pay

## 2020-01-13 ENCOUNTER — Encounter: Payer: Self-pay | Admitting: Physical Therapy

## 2020-01-13 ENCOUNTER — Ambulatory Visit: Payer: Medicare Other | Admitting: Physical Therapy

## 2020-01-13 DIAGNOSIS — M25562 Pain in left knee: Secondary | ICD-10-CM | POA: Diagnosis not present

## 2020-01-13 DIAGNOSIS — M6281 Muscle weakness (generalized): Secondary | ICD-10-CM

## 2020-01-13 DIAGNOSIS — M25662 Stiffness of left knee, not elsewhere classified: Secondary | ICD-10-CM

## 2020-01-13 DIAGNOSIS — R600 Localized edema: Secondary | ICD-10-CM

## 2020-01-13 NOTE — Therapy (Signed)
[facility] [place], [place], [address] Phone: [phone]   Fax:  [phone]  Physical Therapy Treatment  Patient Details  Name: Wesley Schultz MRN: [id number] Date of Birth: 07-27-1950 Referring Provider (PT): Dr. Noemi Chapel, MD   Encounter Date: 01/13/2020   PT End of Session - 01/13/20 1440    Visit Number 11    Number of Visits 18    Date for PT Re-Evaluation 02/26/20    Authorization Type FOTO; progress note every 10th visit; KX modifier at 15th visit    PT Start Time 1344    PT Stop Time 1434    PT Time Calculation (min) 50 min    Activity Tolerance Patient tolerated treatment well    Behavior During Therapy [facility] for tasks assessed/performed           Past Medical History:  Diagnosis Date  . Arthritis   . Asthma   . Colon polyps   . GERD (gastroesophageal reflux disease)   . Primary localized osteoarthritis of left knee 10/05/2019    Past Surgical History:  Procedure Laterality Date  . BACK SURGERY     2008, 2009, 2010  . CATARACT EXTRACTION, BILATERAL    . CERVICAL SPINE SURGERY    . HERNIA REPAIR    . LUMBAR LAMINECTOMY      There were no vitals filed for this visit.   Subjective Assessment - 01/13/20 1356    Subjective COVID-19 screen performed prior to patient entering clinic.  Reports ongoing soreness in L quad but more pain in right knee. Anticipating R knee replacement but states surgery is not scheduled .    Pertinent History L TKA 12/16/2019, Asthma, history of lumbar surgery    Limitations Sitting;Standing;Walking;House hold activities    How long can you stand comfortably? short periods    How long can you walk comfortably? around home    Patient Stated Goals walk without AD    Currently in Pain? No/denies              [facility] PT Assessment - 01/13/20 0001      Assessment   Medical Diagnosis S/P left total knee replacement    Referring Provider (PT) Dr. Noemi Chapel, MD    Onset  Date/Surgical Date 12/16/19    Next MD Visit October 2021    Prior Therapy no      Precautions   Precautions Other (comment)    Precaution Comments no ultrasound      Restrictions   Weight Bearing Restrictions No                         [facility] Adult PT Treatment/Exercise - 01/13/20 0001      Exercises   Exercises Knee/Hip      Knee/Hip Exercises: Aerobic   Recumbent Bike L3, seat 7 x5 min    Nustep L4, seat 8 x12 min      Knee/Hip Exercises: Seated   Long Arc Quad Strengthening;Left;2 sets;10 reps    Long Arc Quad Weight 2 lbs.    Hamstring Curl Strengthening;2 sets;10 reps    Hamstring Limitations red theraband      Knee/Hip Exercises: Supine   Short Arc Quad Sets Strengthening;Left;20 reps    Short Arc Quad Sets Limitations 2#      Modalities   Modalities Vasopneumatic      Vasopneumatic   Number Minutes Vasopneumatic  15 minutes    Vasopnuematic Location  Knee  Vasopneumatic Pressure Low    Vasopneumatic Temperature  34      Manual Therapy   Manual Therapy Passive ROM    Passive ROM PROM of L knee into flexion, extension with holds at end range; contract relax in sitting to improve ROM                    PT Short Term Goals - 01/11/20 1432      PT SHORT TERM GOAL #1   Title Patient will ambulate with SPC with minimal gait devations and left knee pain less than or equal to 2/10.    Time 2    Period Weeks    Status Achieved      PT SHORT TERM GOAL #2   Title Patient will demonstrate 2-90 degrees of left knee AROM to improve ability to perform functional tasks.    Time 2    Period Weeks    Status Partially Met             PT Long Term Goals - 01/11/20 1431      PT LONG TERM GOAL #1   Title Patient will be independent with advanced HEP    Time 6    Period Weeks    Status On-going      PT LONG TERM GOAL #2   Title Patient will demonstrate 0-115+ degrees of left knee AROM to improve ability to perform ADLs and functional  tasks.    Time 6    Period Weeks    Status Partially Met      PT LONG TERM GOAL #3   Title Patient will demonstrate reciprocating stair ambulation with one rail to safely acess basement.    Time 6    Period Weeks    Status On-going      PT LONG TERM GOAL #4   Title Patient will demonstrate 4+/5 left knee MMT in all planes to improve stability during functional tasks.    Time 6    Period Weeks    Status On-going      PT LONG TERM GOAL #5   Title Patient will be independent with performing ADLs and home activities with left knee pain less than or equal to 2/10.    Time 6    Period Weeks    Status On-going                 Plan - 01/13/20 1444    Clinical Impression Statement Patient responded well to therapy session with only slight reports of increased pain. Patient responded well to the progression of strengthening TEs to left knee but ongoing R knee pain. Patient and PT discussed adding additional visits for strengthening of L knee but if needed to DC due to possible R TKA we can. Patient reported he will call MD for follow up visit and try to arrange a schedule if R TKA to be performed this year. Normal response to modalities upon removal.    Personal Factors and Comorbidities Age;Comorbidity 2    Comorbidities L TKA 12/16/2019, Asthma, history of lumbar surgery    Examination-Activity Limitations Locomotion Level;Transfers;Stand;Stairs;Dressing    Stability/Clinical Decision Making Stable/Uncomplicated    Clinical Decision Making Low    Rehab Potential Excellent    PT Duration 6 weeks    PT Treatment/Interventions ADLs/Self Care Home Management;Cryotherapy;Electrical Stimulation;Moist Heat;Gait training;Stair training;Functional mobility training;Therapeutic activities;Therapeutic exercise;Balance training;Neuromuscular re-education;Manual techniques;Passive range of motion;Patient/family education;Scar mobilization;Vasopneumatic Device;Taping    PT Next Visit Plan nustep    and bike, AROM/AAROM/PROM to left knee; gait training for normalized pattern. modalities PRN for pain relief    PT Home Exercise Plan see patient education section    Consulted and Agree with Plan of Care Patient           Patient will benefit from skilled therapeutic intervention in order to improve the following deficits and impairments:  Abnormal gait, Difficulty walking, Decreased range of motion, Decreased activity tolerance, Decreased balance, Decreased strength, Increased edema, Pain  Visit Diagnosis: Acute pain of left knee  Stiffness of left knee, not elsewhere classified  Localized edema  Muscle weakness (generalized)     Problem List Patient Active Problem List   Diagnosis Date Noted  . Preoperative clearance 10/23/2019  . Family history of heart disease 10/23/2019  . Atypical chest pain 10/23/2019  . Hyperlipidemia 10/06/2019  . Smoker 10/05/2019  . Primary localized osteoarthritis of left knee 10/05/2019  . COPD, mild ([facility]) 01/06/2015  . ADENOMATOUS COLONIC POLYP 02/17/2008  . GERD 02/17/2008  . BACK PAIN, CHRONIC 02/17/2008  . B12 DEFICIENCY 12/30/2007  . WEIGHT LOSS 12/30/2007    Krystle Mangawang, PT, DPT 01/13/2020, 2:55 PM  [facility] [address] [place], [place], [address] Phone: [phone]   Fax:  [phone]  Name: Wesley Schultz MRN: [id number] Date of Birth: 12/25/1950   

## 2020-01-15 ENCOUNTER — Ambulatory Visit: Payer: Medicare Other | Admitting: Physical Therapy

## 2020-01-15 ENCOUNTER — Other Ambulatory Visit: Payer: Self-pay

## 2020-01-15 DIAGNOSIS — R600 Localized edema: Secondary | ICD-10-CM

## 2020-01-15 DIAGNOSIS — M25562 Pain in left knee: Secondary | ICD-10-CM | POA: Diagnosis not present

## 2020-01-15 DIAGNOSIS — M25662 Stiffness of left knee, not elsewhere classified: Secondary | ICD-10-CM

## 2020-01-15 DIAGNOSIS — M6281 Muscle weakness (generalized): Secondary | ICD-10-CM

## 2020-01-15 NOTE — Therapy (Signed)
[facility] [place], [place], [address] Phone: [phone]   Fax:  [phone]  Physical Therapy Treatment  Patient Details  Name: Wesley Schultz MRN: [id number] Date of Birth: 01-07-1951 Referring Provider (PT): Dr. Noemi Chapel, MD   Encounter Date: 01/15/2020   PT End of Session - 01/15/20 1209    Visit Number 12    Number of Visits 18    Date for PT Re-Evaluation 02/26/20    Authorization Type FOTO; progress note every 10th visit; KX modifier at 15th visit    PT Start Time 1115    PT Stop Time 1200    PT Time Calculation (min) 45 min    Activity Tolerance Patient tolerated treatment well    Behavior During Therapy [facility] for tasks assessed/performed           Past Medical History:  Diagnosis Date  . Arthritis   . Asthma   . Colon polyps   . GERD (gastroesophageal reflux disease)   . Primary localized osteoarthritis of left knee 10/05/2019    Past Surgical History:  Procedure Laterality Date  . BACK SURGERY     2008, 2009, 2010  . CATARACT EXTRACTION, BILATERAL    . CERVICAL SPINE SURGERY    . HERNIA REPAIR    . LUMBAR LAMINECTOMY      There were no vitals filed for this visit.   Subjective Assessment - 01/15/20 1149    Subjective COVID-19 screen performed prior to patient entering clinic.  Getting other knee done on 29th of this month.    Pertinent History L TKA 12/16/2019, Asthma, history of lumbar surgery    Limitations Sitting;Standing;Walking;House hold activities    How long can you stand comfortably? short periods    How long can you walk comfortably? around home    Patient Stated Goals walk without AD    Pain Location Knee    Pain Orientation Left    Pain Descriptors / Indicators Sore    Pain Type Surgical pain    Pain Onset 1 to 4 weeks ago                             [facility] Adult PT Treatment/Exercise - 01/15/20 0001      Exercises   Exercises Knee/Hip      Knee/Hip  Exercises: Aerobic   Recumbent Bike 15 minutes moving seat forward as tolerated to increase knee flexion.      Knee/Hip Exercises: Machines for Strengthening   Cybex Knee Extension 10# x 3 minutes.    Cybex Knee Flexion 30# x 3 minutes       Modalities   Modalities Electrical Stimulation;Vasopneumatic      Electrical Stimulation   Electrical Stimulation Location Left knee    Electrical Stimulation Action Medial knee.    Electrical Stimulation Parameters 80-150 Hz x 15 minutes.      Vasopneumatic   Number Minutes Vasopneumatic  15 minutes    Vasopnuematic Location  --   Left knee.   Vasopneumatic Pressure Low      Manual Therapy   Manual Therapy Passive ROM    Passive ROM PROM x 3 minutes to patient's left knee.                    PT Short Term Goals - 01/11/20 1432      PT SHORT TERM GOAL #1   Title Patient will ambulate with SPC with  minimal gait devations and left knee pain less than or equal to 2/10.    Time 2    Period Weeks    Status Achieved      PT SHORT TERM GOAL #2   Title Patient will demonstrate 2-90 degrees of left knee AROM to improve ability to perform functional tasks.    Time 2    Period Weeks    Status Partially Met             PT Long Term Goals - 01/11/20 1431      PT LONG TERM GOAL #1   Title Patient will be independent with advanced HEP    Time 6    Period Weeks    Status On-going      PT LONG TERM GOAL #2   Title Patient will demonstrate 0-115+ degrees of left knee AROM to improve ability to perform ADLs and functional tasks.    Time 6    Period Weeks    Status Partially Met      PT LONG TERM GOAL #3   Title Patient will demonstrate reciprocating stair ambulation with one rail to safely acess basement.    Time 6    Period Weeks    Status On-going      PT LONG TERM GOAL #4   Title Patient will demonstrate 4+/5 left knee MMT in all planes to improve stability during functional tasks.    Time 6    Period Weeks    Status  On-going      PT LONG TERM GOAL #5   Title Patient will be independent with performing ADLs and home activities with left knee pain less than or equal to 2/10.    Time 6    Period Weeks    Status On-going                 Plan - 01/15/20 1158    Clinical Impression Statement Patient doing great.  Added knee extension and hamstring curls.    Personal Factors and Comorbidities Age;Comorbidity 2    Comorbidities L TKA 12/16/2019, Asthma, history of lumbar surgery    Examination-Activity Limitations Locomotion Level;Transfers;Stand;Stairs;Dressing    Stability/Clinical Decision Making Stable/Uncomplicated    Rehab Potential Excellent    PT Duration 6 weeks    PT Treatment/Interventions ADLs/Self Care Home Management;Cryotherapy;Electrical Stimulation;Moist Heat;Gait training;Stair training;Functional mobility training;Therapeutic activities;Therapeutic exercise;Balance training;Neuromuscular re-education;Manual techniques;Passive range of motion;Patient/family education;Scar mobilization;Vasopneumatic Device;Taping    PT Next Visit Plan nustep and bike, AROM/AAROM/PROM to left knee; gait training for normalized pattern. modalities PRN for pain relief    PT Home Exercise Plan see patient education section    Consulted and Agree with Plan of Care Patient           Patient will benefit from skilled therapeutic intervention in order to improve the following deficits and impairments:  Abnormal gait, Difficulty walking, Decreased range of motion, Decreased activity tolerance, Decreased balance, Decreased strength, Increased edema, Pain  Visit Diagnosis: Acute pain of left knee  Stiffness of left knee, not elsewhere classified  Localized edema  Muscle weakness (generalized)     Problem List Patient Active Problem List   Diagnosis Date Noted  . Preoperative clearance 10/23/2019  . Family history of heart disease 10/23/2019  . Atypical chest pain 10/23/2019  . Hyperlipidemia  10/06/2019  . Smoker 10/05/2019  . Primary localized osteoarthritis of left knee 10/05/2019  . COPD, mild ([place]) 01/06/2015  . ADENOMATOUS COLONIC POLYP 02/17/2008  . GERD 02/17/2008  .  BACK PAIN, CHRONIC 02/17/2008  . B12 DEFICIENCY 12/30/2007  . WEIGHT LOSS 12/30/2007    Demarko Zeimet, [place] MPT 01/15/2020, 12:10 PM  [facility] [address] [place], [place], [address] Phone: [phone]   Fax:  [phone]  Name: Wesley Schultz MRN: [id number] Date of Birth: [birth year]

## 2020-01-18 ENCOUNTER — Other Ambulatory Visit: Payer: Self-pay

## 2020-01-18 ENCOUNTER — Ambulatory Visit: Payer: Medicare Other | Admitting: Physical Therapy

## 2020-01-18 DIAGNOSIS — M25562 Pain in left knee: Secondary | ICD-10-CM

## 2020-01-18 DIAGNOSIS — M6281 Muscle weakness (generalized): Secondary | ICD-10-CM

## 2020-01-18 DIAGNOSIS — M25662 Stiffness of left knee, not elsewhere classified: Secondary | ICD-10-CM

## 2020-01-18 DIAGNOSIS — R600 Localized edema: Secondary | ICD-10-CM

## 2020-01-18 NOTE — Therapy (Signed)
[facility] [place], [place], [address] Phone: [phone]   Fax:  [phone]  Physical Therapy Treatment  Patient Details  Name: Wesley Schultz MRN: [id number] Date of Birth: Sep 27, 1950 Referring Provider (PT): Dr. Noemi Chapel, MD   Encounter Date: 01/18/2020   PT End of Session - 01/18/20 1326    Visit Number 13    Number of Visits 18    Date for PT Re-Evaluation 02/26/20    Authorization Type FOTO; progress note every 10th visit; KX modifier at 15th visit    PT Start Time 1255    PT Stop Time 1339    PT Time Calculation (min) 44 min    Activity Tolerance Patient tolerated treatment well    Behavior During Therapy [facility] for tasks assessed/performed           Past Medical History:  Diagnosis Date  . Arthritis   . Asthma   . Colon polyps   . GERD (gastroesophageal reflux disease)   . Primary localized osteoarthritis of left knee 10/05/2019    Past Surgical History:  Procedure Laterality Date  . BACK SURGERY     2008, 2009, 2010  . CATARACT EXTRACTION, BILATERAL    . CERVICAL SPINE SURGERY    . HERNIA REPAIR    . LUMBAR LAMINECTOMY      There were no vitals filed for this visit.   Subjective Assessment - 01/18/20 1305    Subjective COVID-19 screen performed prior to patient entering clinic.  Working on stripping a car over the weekend.  Knee feels stiff.                             [facility] Adult PT Treatment/Exercise - 01/18/20 0001      Exercises   Exercises Knee/Hip      Knee/Hip Exercises: Aerobic   Recumbent Bike 15 minutes.      Knee/Hip Exercises: Machines for Strengthening   Cybex Knee Extension 10# x 3 minutes.    Cybex Knee Flexion 30# x 3 minutes.      Modalities   Modalities Psychologist, educational Location Left knee    Electrical Stimulation Action Medial knee.    Electrical Stimulation Parameters 80-150 Hz  x 15 minutes.      Vasopneumatic   Number Minutes Vasopneumatic  15 minutes    Vasopnuematic Location  --   Left knee.   Vasopneumatic Pressure Low      Manual Therapy   Manual Therapy Passive ROM    Passive ROM PROM x 3 minutes.                    PT Short Term Goals - 01/11/20 1432      PT SHORT TERM GOAL #1   Title Patient will ambulate with SPC with minimal gait devations and left knee pain less than or equal to 2/10.    Time 2    Period Weeks    Status Achieved      PT SHORT TERM GOAL #2   Title Patient will demonstrate 2-90 degrees of left knee AROM to improve ability to perform functional tasks.    Time 2    Period Weeks    Status Partially Met             PT Long Term Goals - 01/11/20 1431      PT LONG  TERM GOAL #1   Title Patient will be independent with advanced HEP    Time 6    Period Weeks    Status On-going      PT LONG TERM GOAL #2   Title Patient will demonstrate 0-115+ degrees of left knee AROM to improve ability to perform ADLs and functional tasks.    Time 6    Period Weeks    Status Partially Met      PT LONG TERM GOAL #3   Title Patient will demonstrate reciprocating stair ambulation with one rail to safely acess basement.    Time 6    Period Weeks    Status On-going      PT LONG TERM GOAL #4   Title Patient will demonstrate 4+/5 left knee MMT in all planes to improve stability during functional tasks.    Time 6    Period Weeks    Status On-going      PT LONG TERM GOAL #5   Title Patient will be independent with performing ADLs and home activities with left knee pain less than or equal to 2/10.    Time 6    Period Weeks    Status On-going                 Plan - 01/18/20 1333    Clinical Impression Statement Left knee felt stiff today due to pin stripping a car over the weekend.  He did very well nonetheless and achieved 120 degrees of left knee flexion today.    Personal Factors and Comorbidities Age;Comorbidity  2    Comorbidities L TKA 12/16/2019, Asthma, history of lumbar surgery    Examination-Activity Limitations Locomotion Level;Transfers;Stand;Stairs;Dressing    Stability/Clinical Decision Making Stable/Uncomplicated    Rehab Potential Excellent    PT Duration 6 weeks    PT Treatment/Interventions ADLs/Self Care Home Management;Cryotherapy;Electrical Stimulation;Moist Heat;Gait training;Stair training;Functional mobility training;Therapeutic activities;Therapeutic exercise;Balance training;Neuromuscular re-education;Manual techniques;Passive range of motion;Patient/family education;Scar mobilization;Vasopneumatic Device;Taping    PT Next Visit Plan nustep and bike, AROM/AAROM/PROM to left knee; gait training for normalized pattern. modalities PRN for pain relief    PT Home Exercise Plan see patient education section    Consulted and Agree with Plan of Care Patient           Patient will benefit from skilled therapeutic intervention in order to improve the following deficits and impairments:  Abnormal gait, Difficulty walking, Decreased range of motion, Decreased activity tolerance, Decreased balance, Decreased strength, Increased edema, Pain  Visit Diagnosis: Acute pain of left knee  Stiffness of left knee, not elsewhere classified  Localized edema  Muscle weakness (generalized)     Problem List Patient Active Problem List   Diagnosis Date Noted  . Preoperative clearance 10/23/2019  . Family history of heart disease 10/23/2019  . Atypical chest pain 10/23/2019  . Hyperlipidemia 10/06/2019  . Smoker 10/05/2019  . Primary localized osteoarthritis of left knee 10/05/2019  . COPD, mild ([place]) 01/06/2015  . ADENOMATOUS COLONIC POLYP 02/17/2008  . GERD 02/17/2008  . BACK PAIN, CHRONIC 02/17/2008  . B12 DEFICIENCY 12/30/2007  . WEIGHT LOSS 12/30/2007    [name], [place] MPT 01/18/2020, 1:40 PM  [facility] [place],  [place], [address] Phone: [phone]   Fax:  [phone]  Name: Wesley Schultz MRN: [id number] Date of Birth: Sep 25, 1950

## 2020-01-19 ENCOUNTER — Other Ambulatory Visit: Payer: Self-pay | Admitting: Physician Assistant

## 2020-01-19 DIAGNOSIS — M1711 Unilateral primary osteoarthritis, right knee: Secondary | ICD-10-CM

## 2020-01-19 DIAGNOSIS — Z96653 Presence of artificial knee joint, bilateral: Secondary | ICD-10-CM

## 2020-01-20 ENCOUNTER — Ambulatory Visit: Payer: Medicare Other | Admitting: Physical Therapy

## 2020-01-20 ENCOUNTER — Other Ambulatory Visit: Payer: Self-pay

## 2020-01-20 DIAGNOSIS — R600 Localized edema: Secondary | ICD-10-CM

## 2020-01-20 DIAGNOSIS — M25562 Pain in left knee: Secondary | ICD-10-CM

## 2020-01-20 DIAGNOSIS — M6281 Muscle weakness (generalized): Secondary | ICD-10-CM

## 2020-01-20 DIAGNOSIS — M25662 Stiffness of left knee, not elsewhere classified: Secondary | ICD-10-CM

## 2020-01-20 NOTE — Therapy (Signed)
[facility] [place], [place], [address] Phone: [phone]   Fax:  [phone]  Physical Therapy Treatment  Patient Details  Name: Wesley Schultz MRN: [id number] Date of Birth: 1951/02/16 Referring Provider (PT): Dr. Noemi Chapel, MD   Encounter Date: 01/20/2020   PT End of Session - 01/20/20 1303    Visit Number 14    Number of Visits 18    Date for PT Re-Evaluation 02/26/20    Authorization Type FOTO; progress note every 10th visit; KX modifier at 15th visit    PT Start Time 1300    PT Stop Time 1348    PT Time Calculation (min) 48 min    Activity Tolerance Patient tolerated treatment well    Behavior During Therapy [facility] for tasks assessed/performed           Past Medical History:  Diagnosis Date  . Arthritis   . Asthma   . Colon polyps   . GERD (gastroesophageal reflux disease)   . Primary localized osteoarthritis of left knee 10/05/2019    Past Surgical History:  Procedure Laterality Date  . BACK SURGERY     2008, 2009, 2010  . CATARACT EXTRACTION, BILATERAL    . CERVICAL SPINE SURGERY    . HERNIA REPAIR    . LUMBAR LAMINECTOMY      There were no vitals filed for this visit.   Subjective Assessment - 01/20/20 1303    Subjective COVID-19 screen performed prior to patient entering clinic.  Patient reports 4-5/10 in left knee today. Tried to go for a walk to loosen up the knee. Will be getting right TKA next week.    Pertinent History L TKA 12/16/2019, Asthma, history of lumbar surgery    Limitations Sitting;Standing;Walking;House hold activities    How long can you stand comfortably? short periods    How long can you walk comfortably? around home    Currently in Pain? Yes    Pain Score 5     Pain Location Knee    Pain Orientation Left    Pain Descriptors / Indicators Sore    Pain Type Surgical pain    Pain Onset More than a month ago    Pain Frequency Constant              [facility] PT Assessment -  01/20/20 0001      Assessment   Medical Diagnosis S/P left total knee replacement    Referring Provider (PT) Dr. Noemi Chapel, MD    Onset Date/Surgical Date 12/16/19    Next MD Visit October 2021    Prior Therapy no      Precautions   Precautions Other (comment)    Precaution Comments no ultrasound      Restrictions   Weight Bearing Restrictions No                         [facility] Adult PT Treatment/Exercise - 01/20/20 0001      Exercises   Exercises Knee/Hip      Knee/Hip Exercises: Aerobic   Recumbent Bike level 2 x15 mins seat 9 to 7      Knee/Hip Exercises: Machines for Strengthening   Cybex Knee Extension 10# x 3 minutes.    Cybex Knee Flexion 30# x 3 minutes.    Cybex Leg Press 1 plate x3 mins      Knee/Hip Exercises: Supine   Heel Prop for Knee Extension 2 minutes    Heel Prop  for Knee Extension Weight (lbs) 2      Modalities   Modalities Psychologist, educational Location left knee    Electrical Stimulation Action IFC    Electrical Stimulation Parameters 80-150 hz x10 mins    Electrical Stimulation Goals Pain      Vasopneumatic   Number Minutes Vasopneumatic  10 minutes    Vasopnuematic Location  Knee    Vasopneumatic Pressure Low    Vasopneumatic Temperature  34 for edema                    PT Short Term Goals - 01/11/20 1432      PT SHORT TERM GOAL #1   Title Patient will ambulate with SPC with minimal gait devations and left knee pain less than or equal to 2/10.    Time 2    Period Weeks    Status Achieved      PT SHORT TERM GOAL #2   Title Patient will demonstrate 2-90 degrees of left knee AROM to improve ability to perform functional tasks.    Time 2    Period Weeks    Status Partially Met             PT Long Term Goals - 01/11/20 1431      PT LONG TERM GOAL #1   Title Patient will be independent with advanced HEP    Time 6    Period Weeks    Status  On-going      PT LONG TERM GOAL #2   Title Patient will demonstrate 0-115+ degrees of left knee AROM to improve ability to perform ADLs and functional tasks.    Time 6    Period Weeks    Status Partially Met      PT LONG TERM GOAL #3   Title Patient will demonstrate reciprocating stair ambulation with one rail to safely acess basement.    Time 6    Period Weeks    Status On-going      PT LONG TERM GOAL #4   Title Patient will demonstrate 4+/5 left knee MMT in all planes to improve stability during functional tasks.    Time 6    Period Weeks    Status On-going      PT LONG TERM GOAL #5   Title Patient will be independent with performing ADLs and home activities with left knee pain less than or equal to 2/10.    Time 6    Period Weeks    Status On-going                 Plan - 01/20/20 1314    Clinical Impression Statement Patient responded well to therapy session with addition of leg press machine. Patient and PT discussed it can take up to a year for the knee to feel "normal" but also to still expect some bouts of soreness and stiffness. Assess goals, and place on hold next visit for patient will be getting R TKA soon. Normal response to modalities upon removal.    Personal Factors and Comorbidities Age;Comorbidity 2    Comorbidities L TKA 12/16/2019, Asthma, history of lumbar surgery    Examination-Activity Limitations Locomotion Level;Transfers;Stand;Stairs;Dressing    Stability/Clinical Decision Making Stable/Uncomplicated    Clinical Decision Making Low    Rehab Potential Excellent    PT Duration 6 weeks    PT Treatment/Interventions ADLs/Self Care Home Management;Cryotherapy;Electrical Stimulation;Moist Heat;Gait training;Stair training;Functional  mobility training;Therapeutic activities;Therapeutic exercise;Balance training;Neuromuscular re-education;Manual techniques;Passive range of motion;Patient/family education;Scar mobilization;Vasopneumatic Device;Taping    PT  Next Visit Plan assess goals, place on hold for R TKA.    PT Home Exercise Plan see patient education section    Consulted and Agree with Plan of Care Patient           Patient will benefit from skilled therapeutic intervention in order to improve the following deficits and impairments:  Abnormal gait, Difficulty walking, Decreased range of motion, Decreased activity tolerance, Decreased balance, Decreased strength, Increased edema, Pain  Visit Diagnosis: Acute pain of left knee  Stiffness of left knee, not elsewhere classified  Localized edema  Muscle weakness (generalized)     Problem List Patient Active Problem List   Diagnosis Date Noted  . Preoperative clearance 10/23/2019  . Family history of heart disease 10/23/2019  . Atypical chest pain 10/23/2019  . Hyperlipidemia 10/06/2019  . Smoker 10/05/2019  . Primary localized osteoarthritis of left knee 10/05/2019  . COPD, mild ([place]) 01/06/2015  . ADENOMATOUS COLONIC POLYP 02/17/2008  . GERD 02/17/2008  . BACK PAIN, CHRONIC 02/17/2008  . B12 DEFICIENCY 12/30/2007  . WEIGHT LOSS 12/30/2007    Gabriela Eves, PT, DPT 01/20/2020, 1:53 PM  [facility] [address] [place], [place], [address] Phone: [phone]   Fax:  [phone]  Name: Wesley Schultz MRN: [id number] Date of Birth: 02-Jan-1951

## 2020-01-22 ENCOUNTER — Ambulatory Visit: Payer: Medicare Other | Admitting: Physical Therapy

## 2020-01-22 ENCOUNTER — Other Ambulatory Visit: Payer: Self-pay

## 2020-01-22 DIAGNOSIS — M25562 Pain in left knee: Secondary | ICD-10-CM | POA: Diagnosis not present

## 2020-01-22 DIAGNOSIS — M25662 Stiffness of left knee, not elsewhere classified: Secondary | ICD-10-CM

## 2020-01-22 DIAGNOSIS — R600 Localized edema: Secondary | ICD-10-CM

## 2020-01-22 DIAGNOSIS — M6281 Muscle weakness (generalized): Secondary | ICD-10-CM

## 2020-01-22 NOTE — Therapy (Signed)
[facility] [place], [place], [address] Phone: [phone]   Fax:  [phone]  Physical Therapy Treatment  Patient Details  Name: Wesley Schultz MRN: [id number] Date of Birth: [birth year] Referring Provider (PT): Dr. Noemi Chapel, MD   Encounter Date: 01/22/2020   PT End of Session - 01/22/20 1249    Visit Number 15    Number of Visits 18    Date for PT Re-Evaluation 02/26/20    Authorization Type FOTO; progress note every 10th visit; KX modifier at 15th visit    PT Start Time 1115    PT Stop Time 1207    PT Time Calculation (min) 52 min    Activity Tolerance Patient tolerated treatment well    Behavior During Therapy [facility] for tasks assessed/performed           Past Medical History:  Diagnosis Date  . Arthritis   . Asthma   . Colon polyps   . GERD (gastroesophageal reflux disease)   . Primary localized osteoarthritis of left knee 10/05/2019    Past Surgical History:  Procedure Laterality Date  . BACK SURGERY     2008, 2009, 2010  . CATARACT EXTRACTION, BILATERAL    . CERVICAL SPINE SURGERY    . HERNIA REPAIR    . LUMBAR LAMINECTOMY      There were no vitals filed for this visit.   Subjective Assessment - 01/22/20 1224    Subjective COVID-19 screen performed prior to patient entering clinic.  Ready for surgery next week to right knee.    Pertinent History L TKA 12/16/2019, Asthma, history of lumbar surgery    Limitations Sitting;Standing;Walking;House hold activities    How long can you stand comfortably? short periods    How long can you walk comfortably? around home    Currently in Pain? Yes    Pain Score 5     Pain Location Knee    Pain Orientation Left    Pain Descriptors / Indicators Sore    Pain Type Surgical pain    Pain Onset More than a month ago              [facility] PT Assessment - 01/22/20 0001      AROM   Left Knee Flexion 125                         [facility] Adult PT  Treatment/Exercise - 01/22/20 0001      Exercises   Exercises Knee/Hip      Knee/Hip Exercises: Aerobic   Recumbent Bike Level 3 x 15 minutes.      Knee/Hip Exercises: Machines for Strengthening   Cybex Knee Extension 10# x 3 minutes.    Cybex Knee Flexion 30# x 3 minutes.      Modalities   Modalities Electrical Stimulation;Vasopneumatic      Electrical Stimulation   Electrical Stimulation Location Left medial knee.    Electrical Stimulation Action Pre-mod.    Electrical Stimulation Parameters 80-150 Hz x 20 minutes.    Electrical Stimulation Goals Pain      Vasopneumatic   Number Minutes Vasopneumatic  20 minutes    Vasopnuematic Location  --   Left knee.   Vasopneumatic Pressure Low      Manual Therapy   Manual Therapy Passive ROM    Passive ROM In supine:  2 minutes left knee flexion stretch.  PT Short Term Goals - 01/11/20 1432      PT SHORT TERM GOAL #1   Title Patient will ambulate with SPC with minimal gait devations and left knee pain less than or equal to 2/10.    Time 2    Period Weeks    Status Achieved      PT SHORT TERM GOAL #2   Title Patient will demonstrate 2-90 degrees of left knee AROM to improve ability to perform functional tasks.    Time 2    Period Weeks    Status Partially Met             PT Long Term Goals - 01/11/20 1431      PT LONG TERM GOAL #1   Title Patient will be independent with advanced HEP    Time 6    Period Weeks    Status On-going      PT LONG TERM GOAL #2   Title Patient will demonstrate 0-115+ degrees of left knee AROM to improve ability to perform ADLs and functional tasks.    Time 6    Period Weeks    Status Partially Met      PT LONG TERM GOAL #3   Title Patient will demonstrate reciprocating stair ambulation with one rail to safely acess basement.    Time 6    Period Weeks    Status On-going      PT LONG TERM GOAL #4   Title Patient will demonstrate 4+/5 left knee MMT in all  planes to improve stability during functional tasks.    Time 6    Period Weeks    Status On-going      PT LONG TERM GOAL #5   Title Patient will be independent with performing ADLs and home activities with left knee pain less than or equal to 2/10.    Time 6    Period Weeks    Status On-going                 Plan - 01/22/20 1227    Clinical Impression Statement Excellent job with left knee flexion to 125 degrees.    Personal Factors and Comorbidities Age;Comorbidity 2    Comorbidities L TKA 12/16/2019, Asthma, history of lumbar surgery    Examination-Activity Limitations Locomotion Level;Transfers;Stand;Stairs;Dressing    Stability/Clinical Decision Making Stable/Uncomplicated    Rehab Potential Excellent    PT Duration 6 weeks    PT Treatment/Interventions ADLs/Self Care Home Management;Cryotherapy;Electrical Stimulation;Moist Heat;Gait training;Stair training;Functional mobility training;Therapeutic activities;Therapeutic exercise;Balance training;Neuromuscular re-education;Manual techniques;Passive range of motion;Patient/family education;Scar mobilization;Vasopneumatic Device;Taping    Consulted and Agree with Plan of Care Patient           Patient will benefit from skilled therapeutic intervention in order to improve the following deficits and impairments:  Abnormal gait, Difficulty walking, Decreased range of motion, Decreased activity tolerance, Decreased balance, Decreased strength, Increased edema, Pain  Visit Diagnosis: Acute pain of left knee  Stiffness of left knee, not elsewhere classified  Localized edema  Muscle weakness (generalized)     Problem List Patient Active Problem List   Diagnosis Date Noted  . Preoperative clearance 10/23/2019  . Family history of heart disease 10/23/2019  . Atypical chest pain 10/23/2019  . Hyperlipidemia 10/06/2019  . Smoker 10/05/2019  . Primary localized osteoarthritis of left knee 10/05/2019  . COPD, mild ([place])  01/06/2015  . ADENOMATOUS COLONIC POLYP 02/17/2008  . GERD 02/17/2008  . BACK PAIN, CHRONIC 02/17/2008  . B12 DEFICIENCY  12/30/2007  . WEIGHT LOSS 12/30/2007    Chadrick Sprinkle, [place] MPT 01/22/2020, 12:50 PM  [facility] [address] [place], [place], [address] Phone: [phone]   Fax:  [phone]  Name: Wesley Schultz MRN: [id number] Date of Birth: [birth year]

## 2020-01-29 ENCOUNTER — Ambulatory Visit: Payer: Medicare Other | Attending: Physician Assistant | Admitting: Physical Therapy

## 2020-01-29 ENCOUNTER — Other Ambulatory Visit: Payer: Self-pay

## 2020-01-29 DIAGNOSIS — M25661 Stiffness of right knee, not elsewhere classified: Secondary | ICD-10-CM | POA: Insufficient documentation

## 2020-01-29 DIAGNOSIS — M25561 Pain in right knee: Secondary | ICD-10-CM | POA: Insufficient documentation

## 2020-01-29 DIAGNOSIS — M6281 Muscle weakness (generalized): Secondary | ICD-10-CM | POA: Diagnosis present

## 2020-01-29 DIAGNOSIS — G8929 Other chronic pain: Secondary | ICD-10-CM | POA: Diagnosis present

## 2020-01-29 DIAGNOSIS — R600 Localized edema: Secondary | ICD-10-CM | POA: Diagnosis present

## 2020-01-29 NOTE — Therapy (Signed)
[facility] [place], [place], [address] Phone: [phone]   Fax:  [phone]  Physical Therapy Treatment  Patient Details  Name: Wesley Schultz MRN: [id number] Date of Birth: 02-28-1951 Referring Provider (PT): Dr. Noemi Chapel, MD   Encounter Date: 01/29/2020   PT End of Session - 01/29/20 1305    Visit Number 16    Number of Visits 30    Date for PT Re-Evaluation 03/25/20    Authorization Type FOTO; progress note every 10th visit; KX modifier at 15th visit    PT Start Time 1030    PT Stop Time 1106    PT Time Calculation (min) 36 min    Activity Tolerance Patient tolerated treatment well    Behavior During Therapy [facility] for tasks assessed/performed           Past Medical History:  Diagnosis Date  . Arthritis   . Asthma   . Colon polyps   . GERD (gastroesophageal reflux disease)   . Primary localized osteoarthritis of left knee 10/05/2019    Past Surgical History:  Procedure Laterality Date  . BACK SURGERY     2008, 2009, 2010  . CATARACT EXTRACTION, BILATERAL    . CERVICAL SPINE SURGERY    . HERNIA REPAIR    . LUMBAR LAMINECTOMY      There were no vitals filed for this visit.   Subjective Assessment - 01/29/20 1306    Subjective COVID-19 screen performed prior to patient entering clinic.  Please see "Plan/Clinical Impression Statement" section.    Pertinent History L TKA 12/16/2019, Asthma, history of lumbar surgery, left TKA.    Limitations Sitting;Standing;Walking;House hold activities    How long can you stand comfortably? short periods    How long can you walk comfortably? around home    Patient Stated Goals walk without AD    Currently in Pain? Yes    Pain Score 9     Pain Location Knee    Pain Orientation Right    Pain Descriptors / Indicators Aching;Throbbing;Sore    Pain Type Surgical pain    Pain Onset In the past 7 days    Pain Frequency Constant    Aggravating Factors  Moving.    Pain  Relieving Factors Pain medication and ice.              [facility] PT Assessment - 01/29/20 0001      Observation/Other Assessments   Focus on Therapeutic Outcomes (FOTO)  90% limitation.                         [facility] Adult PT Treatment/Exercise - 01/29/20 0001      Vasopneumatic   Number Minutes Vasopneumatic  15 minutes    Vasopnuematic Location  --   Right knee.   Vasopneumatic Pressure Low                    PT Short Term Goals - 01/29/20 1327      PT SHORT TERM GOAL #1   Title Patient will ambulate with SPC with minimal gait devations and right knee pain less than or equal to 2/10.    Time 2    Period Weeks    Status New      PT SHORT TERM GOAL #2   Title Patient will demonstrate 2-90 degrees of right  knee AROM to improve ability to perform functional tasks.    Time 2  Period Weeks    Status New             PT Long Term Goals - 01/29/20 1327      PT LONG TERM GOAL #1   Title Patient will be independent with advanced HEP    Time 6    Period Weeks    Status New      PT LONG TERM GOAL #2   Title Patient will demonstrate 0-115+ degrees of right  knee AROM to improve ability to perform ADLs and functional tasks.    Time 6    Period Weeks    Status New      PT LONG TERM GOAL #3   Title Patient will demonstrate reciprocating stair ambulation with one rail to safely acess basement.    Time 6    Period Weeks    Status New      PT LONG TERM GOAL #4   Title Patient will demonstrate 4+/5 right knee MMT in all planes to improve stability during functional tasks.    Time 6    Period Weeks    Status New      PT LONG TERM GOAL #5   Title Patient will be independent with performing ADLs and home activities with right  knee pain less than or equal to 2/10.    Time 6    Period Weeks    Status New                 Plan - 01/29/20 1320    Clinical Impression Statement The patient underwent a right TKA on 02/26/20 as an  outpatient.  He is currently in a great deal of pain.  Today upon presentation to the clinic has was using a FWW and was essentially dragging his right foot today.  He states he did not have a nerve block.  He is able to perform a SLR and can extension his right knee against gravity.  He has a post-surgical dressing and Aquacel dressing intact and he has TED hose donned.  He exhibits -20 degrees of right knee extension in supine and active-assistive flexion to 65 degrees and passive to 70 degrees.  He c/o diffuse right knee pain currently.  Circumferential measuremts over TED hose demonstrated 4 cm greater on right than left.    Personal Factors and Comorbidities Age;Comorbidity 2    Comorbidities L TKA 12/16/2019, Asthma, history of lumbar surgery    Examination-Activity Limitations Locomotion Level;Transfers;Stand;Stairs;Dressing    Stability/Clinical Decision Making Stable/Uncomplicated    Clinical Decision Making Low    Rehab Potential Excellent    PT Duration 6 weeks    PT Treatment/Interventions ADLs/Self Care Home Management;Cryotherapy;Electrical Stimulation;Moist Heat;Gait training;Stair training;Functional mobility training;Therapeutic activities;Therapeutic exercise;Balance training;Neuromuscular re-education;Manual techniques;Passive range of motion;Patient/family education;Scar mobilization;Vasopneumatic Device;Taping    PT Next Visit Plan TKA protocol.  Vasopneumatic on low.    Consulted and Agree with Plan of Care Patient           Patient will benefit from skilled therapeutic intervention in order to improve the following deficits and impairments:  Abnormal gait, Difficulty walking, Decreased range of motion, Decreased activity tolerance, Decreased balance, Decreased strength, Increased edema, Pain  Visit Diagnosis: Chronic pain of right knee - Plan: PT plan of care cert/re-cert  Stiffness of right knee, not elsewhere classified - Plan: PT plan of care cert/re-cert  Localized  edema - Plan: PT plan of care cert/re-cert  Muscle weakness (generalized) - Plan: PT plan of care cert/re-cert  Problem List Patient Active Problem List   Diagnosis Date Noted  . Preoperative clearance 10/23/2019  . Family history of heart disease 10/23/2019  . Atypical chest pain 10/23/2019  . Hyperlipidemia 10/06/2019  . Smoker 10/05/2019  . Primary localized osteoarthritis of left knee 10/05/2019  . COPD, mild ([place]) 01/06/2015  . ADENOMATOUS COLONIC POLYP 02/17/2008  . GERD 02/17/2008  . BACK PAIN, CHRONIC 02/17/2008  . B12 DEFICIENCY 12/30/2007  . WEIGHT LOSS 12/30/2007    Theophil Thivierge, [place] MPT 01/29/2020, 1:32 PM  [facility] [place], [place], [address] Phone: [phone]   Fax:  [phone]  Name: Wesley Schultz MRN: [id number] Date of Birth: October 14, 1950

## 2020-02-01 ENCOUNTER — Other Ambulatory Visit (HOSPITAL_COMMUNITY): Payer: Self-pay | Admitting: Orthopedic Surgery

## 2020-02-01 ENCOUNTER — Ambulatory Visit (HOSPITAL_COMMUNITY)
Admission: RE | Admit: 2020-02-01 | Discharge: 2020-02-01 | Disposition: A | Discharge status: Home / Self Care | Payer: Medicare Other | Source: Ambulatory Visit | Attending: Surgery | Admitting: Surgery

## 2020-02-01 ENCOUNTER — Encounter: Payer: Self-pay | Admitting: Physical Therapy

## 2020-02-01 ENCOUNTER — Other Ambulatory Visit: Payer: Self-pay

## 2020-02-01 DIAGNOSIS — M79661 Pain in right lower leg: Secondary | ICD-10-CM | POA: Insufficient documentation

## 2020-02-01 DIAGNOSIS — M7989 Other specified soft tissue disorders: Secondary | ICD-10-CM | POA: Insufficient documentation

## 2020-02-02 ENCOUNTER — Ambulatory Visit: Payer: Medicare Other | Admitting: *Deleted

## 2020-02-02 ENCOUNTER — Other Ambulatory Visit: Payer: Self-pay

## 2020-02-02 DIAGNOSIS — R600 Localized edema: Secondary | ICD-10-CM

## 2020-02-02 DIAGNOSIS — M6281 Muscle weakness (generalized): Secondary | ICD-10-CM

## 2020-02-02 DIAGNOSIS — M25561 Pain in right knee: Secondary | ICD-10-CM | POA: Diagnosis not present

## 2020-02-02 DIAGNOSIS — G8929 Other chronic pain: Secondary | ICD-10-CM

## 2020-02-02 DIAGNOSIS — M25661 Stiffness of right knee, not elsewhere classified: Secondary | ICD-10-CM

## 2020-02-02 NOTE — Therapy (Signed)
[facility] [place], [place], [address] Phone: [phone]   Fax:  [phone]  Physical Therapy Treatment  Patient Details  Name: Wesley Schultz MRN: [id number] Date of Birth: [birth year] Referring Provider (PT): Dr. Noemi Chapel, MD   Encounter Date: 02/02/2020   PT End of Session - 02/02/20 1358    Visit Number 17    Number of Visits 30    Date for PT Re-Evaluation 03/25/20    Authorization Type FOTO; progress note every 10th visit; KX modifier at 15th visit    Authorization Time Period visits RT knee:    2nd    Authorization - Visit Number --   2 RT TKA   PT Start Time 5003    PT Stop Time 1435    PT Time Calculation (min) 50 min           Past Medical History:  Diagnosis Date  . Arthritis   . Asthma   . Colon polyps   . GERD (gastroesophageal reflux disease)   . Primary localized osteoarthritis of left knee 10/05/2019    Past Surgical History:  Procedure Laterality Date  . BACK SURGERY     2008, 2009, 2010  . CATARACT EXTRACTION, BILATERAL    . CERVICAL SPINE SURGERY    . HERNIA REPAIR    . LUMBAR LAMINECTOMY      There were no vitals filed for this visit.   Subjective Assessment - 02/02/20 1353    Subjective COVID-19 screen performed prior to patient entering clinic.  Went back to MD yesterday. Still in a lot of pain. RT TKA 01-27-20.    Pertinent History L TKA 12/16/2019, Asthma, history of lumbar surgery, left TKA.    Limitations Sitting;Standing;Walking;House hold activities    How long can you stand comfortably? short periods    How long can you walk comfortably? around home    Patient Stated Goals walk without AD    Currently in Pain? Yes    Pain Score 9     Pain Location Knee    Pain Orientation Right    Pain Descriptors / Indicators Aching;Throbbing    Pain Type Surgical pain    Pain Onset In the past 7 days    Pain Frequency Constant                             [facility] Adult  PT Treatment/Exercise - 02/02/20 0001      Exercises   Exercises Knee/Hip      Knee/Hip Exercises: Aerobic   Recumbent Bike Level 3 x 10 minutes.      Knee/Hip Exercises: Seated   Long Arc Quad AROM;Right;3 sets;10 reps      Vasopneumatic   Number Minutes Vasopneumatic  15 minutes    Vasopnuematic Location  Knee    Vasopneumatic Pressure Low    Vasopneumatic Temperature  34 for edema      Manual Therapy   Manual Therapy Passive ROM    Manual therapy comments flexion 80 degrees    Passive ROM Gentle PROM for flexion and extension in sitting                    PT Short Term Goals - 01/29/20 1327      PT SHORT TERM GOAL #1   Title Patient will ambulate with SPC with minimal gait devations and right knee pain less than or equal to 2/10.  Time 2    Period Weeks    Status New      PT SHORT TERM GOAL #2   Title Patient will demonstrate 2-90 degrees of right  knee AROM to improve ability to perform functional tasks.    Time 2    Period Weeks    Status New             PT Long Term Goals - 01/29/20 1327      PT LONG TERM GOAL #1   Title Patient will be independent with advanced HEP    Time 6    Period Weeks    Status New      PT LONG TERM GOAL #2   Title Patient will demonstrate 0-115+ degrees of right  knee AROM to improve ability to perform ADLs and functional tasks.    Time 6    Period Weeks    Status New      PT LONG TERM GOAL #3   Title Patient will demonstrate reciprocating stair ambulation with one rail to safely acess basement.    Time 6    Period Weeks    Status New      PT LONG TERM GOAL #4   Title Patient will demonstrate 4+/5 right knee MMT in all planes to improve stability during functional tasks.    Time 6    Period Weeks    Status New      PT LONG TERM GOAL #5   Title Patient will be independent with performing ADLs and home activities with right  knee pain less than or equal to 2/10.    Time 6    Period Weeks    Status New                   Plan - 02/02/20 1359    Clinical Impression Statement Pt arrived today ambulating in FWW withPWB on RT LE due to pain. He reports f/u with MD yesterday and was given script for prednisone to help with pain and swelling. Pt was still limited on what he could tolerate due to pain. Nustep  and PROM was performed with 80 degrees of flexion obtained in the seated position.Wesley Schultz applied for edema control    Comorbidities L TKA 12/16/2019, Asthma, history of lumbar surgery    Examination-Activity Limitations Locomotion Level;Transfers;Stand;Stairs;Dressing    Stability/Clinical Decision Making Stable/Uncomplicated    Rehab Potential Excellent    PT Duration 6 weeks    PT Treatment/Interventions ADLs/Self Care Home Management;Cryotherapy;Electrical Stimulation;Moist Heat;Gait training;Stair training;Functional mobility training;Therapeutic activities;Therapeutic exercise;Balance training;Neuromuscular re-education;Manual techniques;Passive range of motion;Patient/family education;Scar mobilization;Vasopneumatic Device;Taping    PT Next Visit Plan TKA protocol.  Vasopneumatic on low.           Patient will benefit from skilled therapeutic intervention in order to improve the following deficits and impairments:  Abnormal gait, Difficulty walking, Decreased range of motion, Decreased activity tolerance, Decreased balance, Decreased strength, Increased edema, Pain  Visit Diagnosis: Chronic pain of right knee  Stiffness of right knee, not elsewhere classified  Localized edema  Muscle weakness (generalized)     Problem List Patient Active Problem List   Diagnosis Date Noted  . Preoperative clearance 10/23/2019  . Family history of heart disease 10/23/2019  . Atypical chest pain 10/23/2019  . Hyperlipidemia 10/06/2019  . Smoker 10/05/2019  . Primary localized osteoarthritis of left knee 10/05/2019  . COPD, mild ([place]) 01/06/2015  . ADENOMATOUS COLONIC POLYP 02/17/2008   . GERD 02/17/2008  .  BACK PAIN, CHRONIC 02/17/2008  . B12 DEFICIENCY 12/30/2007  . WEIGHT LOSS 12/30/2007    Wesley Schultz,CHRIS, PTA 02/02/2020, 5:08 PM  [facility] [place], [place], [address] Phone: [phone]   Fax:  [phone]  Name: Wesley Schultz MRN: [id number] Date of Birth: 08-21-1950

## 2020-02-03 ENCOUNTER — Ambulatory Visit: Payer: Medicare Other | Admitting: Physical Therapy

## 2020-02-03 ENCOUNTER — Other Ambulatory Visit: Payer: Self-pay

## 2020-02-03 DIAGNOSIS — M6281 Muscle weakness (generalized): Secondary | ICD-10-CM

## 2020-02-03 DIAGNOSIS — G8929 Other chronic pain: Secondary | ICD-10-CM

## 2020-02-03 DIAGNOSIS — M25561 Pain in right knee: Secondary | ICD-10-CM | POA: Diagnosis not present

## 2020-02-03 DIAGNOSIS — M25661 Stiffness of right knee, not elsewhere classified: Secondary | ICD-10-CM

## 2020-02-03 DIAGNOSIS — R600 Localized edema: Secondary | ICD-10-CM

## 2020-02-03 NOTE — Therapy (Signed)
[facility] [place], [place], [address] Phone: [phone]   Fax:  [phone]  Physical Therapy Treatment  Patient Details  Name: Wesley Schultz MRN: [id number] Date of Birth: 1950/08/30 Referring Provider (PT): Dr. Noemi Chapel, MD   Encounter Date: 02/03/2020   PT End of Session - 02/03/20 1338    Visit Number 18    Number of Visits 30    Date for PT Re-Evaluation 03/25/20    Authorization Type FOTO; progress note every 10th visit; KX modifier at 15th visit    Authorization Time Period visits RT knee:    2nd    PT Start Time 0100    PT Stop Time 0155    PT Time Calculation (min) 55 min    Activity Tolerance Patient tolerated treatment well    Behavior During Therapy [facility] for tasks assessed/performed           Past Medical History:  Diagnosis Date  . Arthritis   . Asthma   . Colon polyps   . GERD (gastroesophageal reflux disease)   . Primary localized osteoarthritis of left knee 10/05/2019    Past Surgical History:  Procedure Laterality Date  . BACK SURGERY     2008, 2009, 2010  . CATARACT EXTRACTION, BILATERAL    . CERVICAL SPINE SURGERY    . HERNIA REPAIR    . LUMBAR LAMINECTOMY      There were no vitals filed for this visit.   Subjective Assessment - 02/03/20 1303    Subjective COVID-19 screen performed prior to patient entering clinic.  Patient arrived in a lot of pain today    Pertinent History L TKA 12/16/2019, Asthma, history of lumbar surgery, left TKA.    Limitations Sitting;Standing;Walking;House hold activities    How long can you stand comfortably? short periods    How long can you walk comfortably? around home    Patient Stated Goals walk without AD    Currently in Pain? Yes    Pain Score --   12   Pain Location Knee    Pain Orientation Right    Pain Descriptors / Indicators Discomfort;Sore;Aching    Pain Type Surgical pain    Pain Onset 1 to 4 weeks ago    Pain Frequency Constant     Aggravating Factors  movement    Pain Relieving Factors at rest meds              [facility] PT Assessment - 02/03/20 0001      AROM   AROM Assessment Site Knee    Right/Left Knee Right    Left Knee Extension -10    Left Knee Flexion 78      PROM   PROM Assessment Site Knee    Right/Left Knee Right    Left Knee Extension -8    Left Knee Flexion 82                         [facility] Adult PT Treatment/Exercise - 02/03/20 0001      Knee/Hip Exercises: Aerobic   Recumbent Bike L1 x31min UE/LE activity, monitored      Vasopneumatic   Number Minutes Vasopneumatic  20 minutes    Vasopnuematic Location  Knee    Vasopneumatic Pressure Low    Vasopneumatic Temperature  34 for edema      Manual Therapy   Manual Therapy Passive ROM;Soft tissue mobilization    Manual therapy comments manaul STW to right  knee surrounding iincision and posterior knee to reduce pain and tightness    Passive ROM manual PROM for right knee flexion and ext with low load holds                     PT Short Term Goals - 02/03/20 1305      PT SHORT TERM GOAL #1   Title Patient will ambulate with SPC with minimal gait devations and right knee pain less than or equal to 2/10.    Time 2    Period Weeks    Status On-going      PT SHORT TERM GOAL #2   Title Patient will demonstrate 2-90 degrees of right  knee AROM to improve ability to perform functional tasks.    Baseline AROM -8 -78 degrees 02/03/20    Time 2    Period Weeks    Status On-going             PT Long Term Goals - 02/03/20 1305      PT LONG TERM GOAL #1   Title Patient will be independent with advanced HEP    Time 6    Period Weeks    Status On-going      PT LONG TERM GOAL #2   Title Patient will demonstrate 0-115+ degrees of right  knee AROM to improve ability to perform ADLs and functional tasks.    Time 6    Period Weeks    Status On-going      PT LONG TERM GOAL #3   Title Patient will demonstrate  reciprocating stair ambulation with one rail to safely acess basement.    Time 6    Period Weeks    Status On-going      PT LONG TERM GOAL #4   Title Patient will demonstrate 4+/5 right knee MMT in all planes to improve stability during functional tasks.    Time 6    Period Weeks    Status On-going      PT LONG TERM GOAL #5   Title Patient will be independent with performing ADLs and home activities with right  knee pain less than or equal to 2/10.    Time 6    Period Weeks    Status On-going                 Plan - 02/03/20 1339    Clinical Impression Statement Patient tolerated treatment fair today due to very painful right knee per reported. Patient arrived with 12/10 pain and increased swelling in ankle which restricting him from movement. Patient is going to MD again today for edema in knee. Patient educated on ice elevation, ankle pumps and quad stes to help with edema. Patient current goals ongoing.    Personal Factors and Comorbidities Age;Comorbidity 2    Comorbidities L TKA 12/16/2019, Asthma, history of lumbar surgery    Examination-Activity Limitations Locomotion Level;Transfers;Stand;Stairs;Dressing    Stability/Clinical Decision Making Stable/Uncomplicated    Rehab Potential Excellent    PT Duration 6 weeks    PT Treatment/Interventions ADLs/Self Care Home Management;Cryotherapy;Electrical Stimulation;Moist Heat;Gait training;Stair training;Functional mobility training;Therapeutic activities;Therapeutic exercise;Balance training;Neuromuscular re-education;Manual techniques;Passive range of motion;Patient/family education;Scar mobilization;Vasopneumatic Device;Taping    PT Next Visit Plan TKA protocol.  Vasopneumatic on low.    Consulted and Agree with Plan of Care Patient           Patient will benefit from skilled therapeutic intervention in order to improve the following deficits and impairments:  Abnormal gait, Difficulty walking, Decreased range of motion,  Decreased activity tolerance, Decreased balance, Decreased strength, Increased edema, Pain  Visit Diagnosis: Chronic pain of right knee  Stiffness of right knee, not elsewhere classified  Localized edema  Muscle weakness (generalized)     Problem List Patient Active Problem List   Diagnosis Date Noted  . Preoperative clearance 10/23/2019  . Family history of heart disease 10/23/2019  . Atypical chest pain 10/23/2019  . Hyperlipidemia 10/06/2019  . Smoker 10/05/2019  . Primary localized osteoarthritis of left knee 10/05/2019  . COPD, mild ([place]) 01/06/2015  . ADENOMATOUS COLONIC POLYP 02/17/2008  . GERD 02/17/2008  . BACK PAIN, CHRONIC 02/17/2008  . B12 DEFICIENCY 12/30/2007  . WEIGHT LOSS 12/30/2007    Micaela Stith P, PTA 02/03/2020, 1:55 PM  [facility] [address] [place], [place], [address] Phone: [phone]   Fax:  [phone]  Name: ETTORE TREBILCOCK MRN: [id number] Date of Birth: [birth year]

## 2020-02-05 ENCOUNTER — Ambulatory Visit: Payer: Medicare Other | Admitting: Physical Therapy

## 2020-02-05 ENCOUNTER — Other Ambulatory Visit: Payer: Self-pay

## 2020-02-05 DIAGNOSIS — G8929 Other chronic pain: Secondary | ICD-10-CM

## 2020-02-05 DIAGNOSIS — M25661 Stiffness of right knee, not elsewhere classified: Secondary | ICD-10-CM

## 2020-02-05 DIAGNOSIS — R600 Localized edema: Secondary | ICD-10-CM

## 2020-02-05 DIAGNOSIS — M25561 Pain in right knee: Secondary | ICD-10-CM | POA: Diagnosis not present

## 2020-02-05 NOTE — Therapy (Signed)
[facility] [place], [place], [address] Phone: [phone]   Fax:  [phone]  Physical Therapy Treatment  Patient Details  Name: Wesley Schultz MRN: [id number] Date of Birth: 09-17-1950 Referring Provider (PT): Dr. Noemi Chapel, MD   Encounter Date: 02/05/2020   PT End of Session - 02/05/20 1210    Visit Number 19    Number of Visits 30    Date for PT Re-Evaluation 03/25/20    Authorization Type FOTO; progress note every 10th visit; KX modifier at 15th visit    Authorization Time Period visits RT knee:    2nd    PT Start Time 1115    PT Stop Time 1206    PT Time Calculation (min) 51 min    Activity Tolerance Patient tolerated treatment well    Behavior During Therapy [facility] for tasks assessed/performed           Past Medical History:  Diagnosis Date  . Arthritis   . Asthma   . Colon polyps   . GERD (gastroesophageal reflux disease)   . Primary localized osteoarthritis of left knee 10/05/2019    Past Surgical History:  Procedure Laterality Date  . BACK SURGERY     2008, 2009, 2010  . CATARACT EXTRACTION, BILATERAL    . CERVICAL SPINE SURGERY    . HERNIA REPAIR    . LUMBAR LAMINECTOMY      There were no vitals filed for this visit.   Subjective Assessment - 02/05/20 1229    Subjective COVID-19 screen performed prior to patient entering clinic.  Patient continues to report a high pain-level.  He had a Doppler which was negative for a DVT.    Pertinent History L TKA 12/16/2019, Asthma, history of lumbar surgery, left TKA.    Limitations Sitting;Standing;Walking;House hold activities    How long can you stand comfortably? short periods    How long can you walk comfortably? around home    Patient Stated Goals walk without AD    Currently in Pain? Yes    Pain Score 9     Pain Location Knee    Pain Orientation Right    Pain Descriptors / Indicators Discomfort;Sore;Aching    Pain Type Surgical pain    Pain Onset 1 to  4 weeks ago                             [facility] Adult PT Treatment/Exercise - 02/05/20 0001      Exercises   Exercises Knee/Hip      Knee/Hip Exercises: Aerobic   Recumbent Bike Level 1 x 3 minutes.      Knee/Hip Exercises: Supine   Short Arc Quad Sets Limitations SAQ's facilitated with VMS to patient's right quadriceps x 15 minutes provided some manual assistance with 10 sec extension holds and 10 sec rest.      Vasopneumatic   Number Minutes Vasopneumatic  15 minutes    Vasopnuematic Location  --   Right knee.   Vasopneumatic Pressure Low      Manual Therapy   Manual Therapy Passive ROM    Passive ROM In supine:  Gentle PROM to patient's right knee x 4 minutes into flexion and extension.                    PT Short Term Goals - 02/03/20 1305      PT SHORT TERM GOAL #1   Title Patient  will ambulate with SPC with minimal gait devations and right knee pain less than or equal to 2/10.    Time 2    Period Weeks    Status On-going      PT SHORT TERM GOAL #2   Title Patient will demonstrate 2-90 degrees of right  knee AROM to improve ability to perform functional tasks.    Baseline AROM -8 -78 degrees 02/03/20    Time 2    Period Weeks    Status On-going             PT Long Term Goals - 02/03/20 1305      PT LONG TERM GOAL #1   Title Patient will be independent with advanced HEP    Time 6    Period Weeks    Status On-going      PT LONG TERM GOAL #2   Title Patient will demonstrate 0-115+ degrees of right  knee AROM to improve ability to perform ADLs and functional tasks.    Time 6    Period Weeks    Status On-going      PT LONG TERM GOAL #3   Title Patient will demonstrate reciprocating stair ambulation with one rail to safely acess basement.    Time 6    Period Weeks    Status On-going      PT LONG TERM GOAL #4   Title Patient will demonstrate 4+/5 right knee MMT in all planes to improve stability during functional tasks.     Time 6    Period Weeks    Status On-going      PT LONG TERM GOAL #5   Title Patient will be independent with performing ADLs and home activities with right  knee pain less than or equal to 2/10.    Time 6    Period Weeks    Status On-going                 Plan - 02/05/20 1233    Clinical Impression Statement Patient reports a high pain-level.  He had a Doppler that was negative for a DVT.  He is using a FWW and is not weight bearing as he states it hurts too bad.  He did, however, do very well with VMS during SAQ's today.    Personal Factors and Comorbidities Age;Comorbidity 2    Comorbidities L TKA 12/16/2019, Asthma, history of lumbar surgery    Examination-Activity Limitations Locomotion Level;Transfers;Stand;Stairs;Dressing    Stability/Clinical Decision Making Stable/Uncomplicated    Rehab Potential Excellent    PT Duration 6 weeks    PT Treatment/Interventions ADLs/Self Care Home Management;Cryotherapy;Electrical Stimulation;Moist Heat;Gait training;Stair training;Functional mobility training;Therapeutic activities;Therapeutic exercise;Balance training;Neuromuscular re-education;Manual techniques;Passive range of motion;Patient/family education;Scar mobilization;Vasopneumatic Device;Taping    PT Next Visit Plan TKA protocol.  Vasopneumatic on low.    PT Home Exercise Plan see patient education section    Consulted and Agree with Plan of Care Patient           Patient will benefit from skilled therapeutic intervention in order to improve the following deficits and impairments:  Abnormal gait, Difficulty walking, Decreased range of motion, Decreased activity tolerance, Decreased balance, Decreased strength, Increased edema, Pain  Visit Diagnosis: Chronic pain of right knee  Stiffness of right knee, not elsewhere classified  Localized edema     Problem List Patient Active Problem List   Diagnosis Date Noted  . Preoperative clearance 10/23/2019  . Family history  of heart disease 10/23/2019  . Atypical  chest pain 10/23/2019  . Hyperlipidemia 10/06/2019  . Smoker 10/05/2019  . Primary localized osteoarthritis of left knee 10/05/2019  . COPD, mild ([place]) 01/06/2015  . ADENOMATOUS COLONIC POLYP 02/17/2008  . GERD 02/17/2008  . BACK PAIN, CHRONIC 02/17/2008  . B12 DEFICIENCY 12/30/2007  . WEIGHT LOSS 12/30/2007    Zayra Devito, [place] MPT 02/05/2020, 12:38 PM  [facility] [address] [place], [place], [address] Phone: [phone]   Fax:  [phone]  Name: Wesley Schultz MRN: [id number] Date of Birth: Jun 06, 1950

## 2020-02-09 ENCOUNTER — Other Ambulatory Visit: Payer: Self-pay

## 2020-02-09 ENCOUNTER — Ambulatory Visit: Payer: Medicare Other | Admitting: Physical Therapy

## 2020-02-09 ENCOUNTER — Encounter: Payer: Self-pay | Admitting: Physical Therapy

## 2020-02-09 DIAGNOSIS — M25561 Pain in right knee: Secondary | ICD-10-CM | POA: Diagnosis not present

## 2020-02-09 DIAGNOSIS — M6281 Muscle weakness (generalized): Secondary | ICD-10-CM

## 2020-02-09 DIAGNOSIS — G8929 Other chronic pain: Secondary | ICD-10-CM

## 2020-02-09 DIAGNOSIS — R600 Localized edema: Secondary | ICD-10-CM

## 2020-02-09 DIAGNOSIS — M25661 Stiffness of right knee, not elsewhere classified: Secondary | ICD-10-CM

## 2020-02-09 NOTE — Therapy (Signed)
[facility] [place], [place], [address] Phone: [phone]   Fax:  [phone]  Physical Therapy Treatment  Patient Details  Name: Wesley Schultz MRN: [id number] Date of Birth: Jul 28, 1950 Referring Provider (PT): Dr. Noemi Chapel, MD   Encounter Date: 02/09/2020   PT End of Session - 02/09/20 1511    Visit Number 20    Number of Visits 30    Date for PT Re-Evaluation 03/25/20    Authorization Type FOTO; progress note every 10th visit; KX modifier at 15th visit    Authorization Time Period visits RT knee:    2nd    PT Start Time 1433    PT Stop Time 1514    PT Time Calculation (min) 41 min    Equipment Utilized During Treatment Other (comment)   B axillary crutches   Activity Tolerance Patient tolerated treatment well    Behavior During Therapy [facility] for tasks assessed/performed           Past Medical History:  Diagnosis Date  . Arthritis   . Asthma   . Colon polyps   . GERD (gastroesophageal reflux disease)   . Primary localized osteoarthritis of left knee 10/05/2019    Past Surgical History:  Procedure Laterality Date  . BACK SURGERY     2008, 2009, 2010  . CATARACT EXTRACTION, BILATERAL    . CERVICAL SPINE SURGERY    . HERNIA REPAIR    . LUMBAR LAMINECTOMY      There were no vitals filed for this visit.   Subjective Assessment - 02/09/20 1452    Subjective COVID 19 screening perfromed on patient upon arrival. Patient reports that he returns to MD in 2 weeks. Reports less pain but still unable to put his foot down completely while he walks.    Pertinent History L TKA 12/16/2019, Asthma, history of lumbar surgery, left TKA.    Limitations Sitting;Standing;Walking;House hold activities    How long can you stand comfortably? short periods    How long can you walk comfortably? around home    Patient Stated Goals walk without AD    Currently in Pain? Yes    Pain Score --   No pain score provided   Pain Location Knee     Pain Orientation Right    Pain Descriptors / Indicators Sore;Discomfort    Pain Type Surgical pain    Pain Onset 1 to 4 weeks ago    Pain Frequency Constant              [facility] PT Assessment - 02/09/20 0001      Assessment   Medical Diagnosis S/P left total knee replacement    Referring Provider (PT) Dr. Noemi Chapel, MD    Onset Date/Surgical Date 12/16/19    Next MD Visit October 2021 "two weeks"    Prior Therapy no      Precautions   Precautions Other (comment)    Precaution Comments no ultrasound      Restrictions   Weight Bearing Restrictions No                         [facility] Adult PT Treatment/Exercise - 02/09/20 0001      Knee/Hip Exercises: Aerobic   Recumbent Bike L2, seat 13 x15 min      Knee/Hip Exercises: Seated   Long Arc Quad AROM;Right;3 sets;10 reps    Heel Slides AAROM;Right;3 sets;10 reps      Knee/Hip Exercises: Supine  Short Arc [employer] AROM;Right;3 sets;10 reps      Modalities   Modalities Vasopneumatic      Vasopneumatic   Number Minutes Vasopneumatic  10 minutes    Vasopnuematic Location  Knee    Vasopneumatic Pressure Low    Vasopneumatic Temperature  34 for edema                    PT Short Term Goals - 02/09/20 1513      PT SHORT TERM GOAL #1   Title Patient will ambulate with SPC with minimal gait devations and right knee pain less than or equal to 2/10.    Time 2    Period Weeks    Status On-going      PT SHORT TERM GOAL #2   Title Patient will demonstrate 2-90 degrees of right  knee AROM to improve ability to perform functional tasks.    Baseline AROM -8 -78 degrees 02/03/20    Time 2    Period Weeks    Status On-going             PT Long Term Goals - 02/03/20 1305      PT LONG TERM GOAL #1   Title Patient will be independent with advanced HEP    Time 6    Period Weeks    Status On-going      PT LONG TERM GOAL #2   Title Patient will demonstrate 0-115+ degrees of right  knee AROM to  improve ability to perform ADLs and functional tasks.    Time 6    Period Weeks    Status On-going      PT LONG TERM GOAL #3   Title Patient will demonstrate reciprocating stair ambulation with one rail to safely acess basement.    Time 6    Period Weeks    Status On-going      PT LONG TERM GOAL #4   Title Patient will demonstrate 4+/5 right knee MMT in all planes to improve stability during functional tasks.    Time 6    Period Weeks    Status On-going      PT LONG TERM GOAL #5   Title Patient will be independent with performing ADLs and home activities with right  knee pain less than or equal to 2/10.    Time 6    Period Weeks    Status On-going                 Plan - 02/09/20 1516    Clinical Impression Statement Patient presented in clinic with clinic with reports of less pain in R knee. Patient had TED hose donned to RLE. Patient still unable to fully weightbear through RLE due to pain. Patient is very hesitant with any R knee ROM or activity. Deficient R quad activation noted today. Normal vasopnuematic response noted following removal of the modality.    Personal Factors and Comorbidities Age;Comorbidity 2    Comorbidities L TKA 12/16/2019, Asthma, history of lumbar surgery    Examination-Activity Limitations Locomotion Level;Transfers;Stand;Stairs;Dressing    Stability/Clinical Decision Making Stable/Uncomplicated    Rehab Potential Excellent    PT Duration 6 weeks    PT Treatment/Interventions ADLs/Self Care Home Management;Cryotherapy;Electrical Stimulation;Moist Heat;Gait training;Stair training;Functional mobility training;Therapeutic activities;Therapeutic exercise;Balance training;Neuromuscular re-education;Manual techniques;Passive range of motion;Patient/family education;Scar mobilization;Vasopneumatic Device;Taping    PT Next Visit Plan TKA protocol.  Vasopneumatic on low.    PT Home Exercise Plan see patient education section  Consulted and Agree with  Plan of Care Patient           Patient will benefit from skilled therapeutic intervention in order to improve the following deficits and impairments:  Abnormal gait, Difficulty walking, Decreased range of motion, Decreased activity tolerance, Decreased balance, Decreased strength, Increased edema, Pain  Visit Diagnosis: Chronic pain of right knee  Stiffness of right knee, not elsewhere classified  Localized edema  Muscle weakness (generalized)     Problem List Patient Active Problem List   Diagnosis Date Noted  . Preoperative clearance 10/23/2019  . Family history of heart disease 10/23/2019  . Atypical chest pain 10/23/2019  . Hyperlipidemia 10/06/2019  . Smoker 10/05/2019  . Primary localized osteoarthritis of left knee 10/05/2019  . COPD, mild ([place]) 01/06/2015  . ADENOMATOUS COLONIC POLYP 02/17/2008  . GERD 02/17/2008  . BACK PAIN, CHRONIC 02/17/2008  . B12 DEFICIENCY 12/30/2007  . WEIGHT LOSS 12/30/2007    Standley Brooking, PTA 02/09/20 4:32 PM   [facility] [address] [place], [place], [address] Phone: [phone]   Fax:  [phone]  Name: Wesley Schultz MRN: [id number] Date of Birth: 03/23/1951  Progress Note Reporting Period 12/18/19 to 02/10/20.  See note below for Objective Data and Assessment of Progress/Goals. Patient has now started PT to his right knee.  He states that this has been been more difficult than his left knee.  He remains on the walker at this time.    Mali Applegate MPT

## 2020-02-11 ENCOUNTER — Other Ambulatory Visit: Payer: Self-pay

## 2020-02-11 ENCOUNTER — Ambulatory Visit: Payer: Medicare Other | Admitting: Physical Therapy

## 2020-02-11 DIAGNOSIS — M25661 Stiffness of right knee, not elsewhere classified: Secondary | ICD-10-CM

## 2020-02-11 DIAGNOSIS — M25561 Pain in right knee: Secondary | ICD-10-CM | POA: Diagnosis not present

## 2020-02-11 DIAGNOSIS — G8929 Other chronic pain: Secondary | ICD-10-CM

## 2020-02-11 DIAGNOSIS — R600 Localized edema: Secondary | ICD-10-CM

## 2020-02-11 DIAGNOSIS — M6281 Muscle weakness (generalized): Secondary | ICD-10-CM

## 2020-02-11 NOTE — Therapy (Signed)
[facility] [place], [place], [address] Phone: [phone]   Fax:  [phone]  Physical Therapy Treatment  Patient Details  Name: Wesley Schultz MRN: [id number] Date of Birth: October 29, 1950 Referring Provider (PT): Dr. Noemi Chapel, MD   Encounter Date: 02/11/2020   PT End of Session - 02/11/20 1443    Visit Number 21    Number of Visits 30    Date for PT Re-Evaluation 03/25/20    Authorization Type FOTO; progress note every 10th visit; KX modifier at 15th visit    Authorization Time Period visits RT knee:    2nd    PT Start Time 1430    PT Stop Time 1518    PT Time Calculation (min) 48 min    Equipment Utilized During Treatment Other (comment)   bilateral axillary crutches   Activity Tolerance Patient tolerated treatment well    Behavior During Therapy [facility] for tasks assessed/performed           Past Medical History:  Diagnosis Date   Arthritis    Asthma    Colon polyps    GERD (gastroesophageal reflux disease)    Primary localized osteoarthritis of left knee 10/05/2019    Past Surgical History:  Procedure Laterality Date   BACK SURGERY     2008, 2009, 2010   CATARACT EXTRACTION, BILATERAL     CERVICAL SPINE SURGERY     HERNIA REPAIR     LUMBAR LAMINECTOMY      There were no vitals filed for this visit.   Subjective Assessment - 02/11/20 1658    Subjective COVID 19 screening perfromed on patient upon arrival. Patient reports with less pain in R knee but still having difficulties with edema.    Pertinent History L TKA 12/16/2019, Asthma, history of lumbar surgery, left TKA.    Limitations Sitting;Standing;Walking;House hold activities    How long can you stand comfortably? short periods    How long can you walk comfortably? around home    Patient Stated Goals walk without AD    Currently in Pain? Yes    Pain Score 6     Pain Location Knee    Pain Orientation Right    Pain Descriptors / Indicators  Discomfort;Sore    Pain Type Surgical pain    Pain Onset 1 to 4 weeks ago    Pain Frequency Constant              [facility] PT Assessment - 02/11/20 0001      Assessment   Medical Diagnosis S/P left total knee replacement    Referring Provider (PT) Dr. Noemi Chapel, MD    Onset Date/Surgical Date 12/16/19    Next MD Visit October 25,2021    Prior Therapy no      Precautions   Precautions Other (comment)    Precaution Comments no ultrasound      Restrictions   Weight Bearing Restrictions No                         [facility] Adult PT Treatment/Exercise - 02/11/20 0001      Exercises   Exercises Knee/Hip      Knee/Hip Exercises: Aerobic   Recumbent Bike L3, seat 11 to 10 x15 min      Knee/Hip Exercises: Standing   Rocker Board 3 minutes      Knee/Hip Exercises: Seated   Long Arc Quad AROM;Right;3 sets;10 reps    Heel Slides AAROM;Right;3 sets;10  reps      Knee/Hip Exercises: Supine   Terminal Knee Extension Strengthening;Right   x2 minutes     Modalities   Modalities Vasopneumatic      Vasopneumatic   Number Minutes Vasopneumatic  10 minutes    Vasopnuematic Location  Knee    Vasopneumatic Pressure Low    Vasopneumatic Temperature  34 for edema      Manual Therapy   Manual Therapy Passive ROM;Joint mobilization    Joint Mobilization patella mobs in all planes to improve ROM    Passive ROM PROM into extension and flexion with intermittent oscillations to decrease guarding and pain.                     PT Short Term Goals - 02/09/20 1513      PT SHORT TERM GOAL #1   Title Patient will ambulate with SPC with minimal gait devations and right knee pain less than or equal to 2/10.    Time 2    Period Weeks    Status On-going      PT SHORT TERM GOAL #2   Title Patient will demonstrate 2-90 degrees of right  knee AROM to improve ability to perform functional tasks.    Baseline AROM -8 -78 degrees 02/03/20    Time 2    Period Weeks    Status  On-going             PT Long Term Goals - 02/03/20 1305      PT LONG TERM GOAL #1   Title Patient will be independent with advanced HEP    Time 6    Period Weeks    Status On-going      PT LONG TERM GOAL #2   Title Patient will demonstrate 0-115+ degrees of right  knee AROM to improve ability to perform ADLs and functional tasks.    Time 6    Period Weeks    Status On-going      PT LONG TERM GOAL #3   Title Patient will demonstrate reciprocating stair ambulation with one rail to safely acess basement.    Time 6    Period Weeks    Status On-going      PT LONG TERM GOAL #4   Title Patient will demonstrate 4+/5 right knee MMT in all planes to improve stability during functional tasks.    Time 6    Period Weeks    Status On-going      PT LONG TERM GOAL #5   Title Patient will be independent with performing ADLs and home activities with right  knee pain less than or equal to 2/10.    Time 6    Period Weeks    Status On-going                 Plan - 02/11/20 1651    Clinical Impression Statement Patient responded fairly well to to therapy session with less reports of pain and a decrease in stiffness after nustep. Patient with notable improvements of gait and more weightbearing through the R LE but still with decreased heel strike. Standing rockerboard for calf stretching performed with good response. R quad activation noted with yellow theraband supine TKEs  but unable to sustain prolonged contraction. No adverse affects upon removal of modalities.    Personal Factors and Comorbidities Age;Comorbidity 2    Comorbidities L TKA 12/16/2019, Asthma, history of lumbar surgery    Examination-Activity Limitations Locomotion Level;Transfers;Stand;Stairs;Dressing  Stability/Clinical Decision Making Stable/Uncomplicated    Clinical Decision Making Low    Rehab Potential Excellent    PT Duration 6 weeks    PT Treatment/Interventions ADLs/Self Care Home  Management;Cryotherapy;Electrical Stimulation;Moist Heat;Gait training;Stair training;Functional mobility training;Therapeutic activities;Therapeutic exercise;Balance training;Neuromuscular re-education;Manual techniques;Passive range of motion;Patient/family education;Scar mobilization;Vasopneumatic Device;Taping    PT Next Visit Plan TKA protocol.  Vasopneumatic on low.    PT Home Exercise Plan see patient education section    Consulted and Agree with Plan of Care Patient           Patient will benefit from skilled therapeutic intervention in order to improve the following deficits and impairments:  Abnormal gait, Difficulty walking, Decreased range of motion, Decreased activity tolerance, Decreased balance, Decreased strength, Increased edema, Pain  Visit Diagnosis: Chronic pain of right knee  Stiffness of right knee, not elsewhere classified  Localized edema  Muscle weakness (generalized)     Problem List Patient Active Problem List   Diagnosis Date Noted   Preoperative clearance 10/23/2019   Family history of heart disease 10/23/2019   Atypical chest pain 10/23/2019   Hyperlipidemia 10/06/2019   Smoker 10/05/2019   Primary localized osteoarthritis of left knee 10/05/2019   COPD, mild ([place]) 01/06/2015   ADENOMATOUS COLONIC POLYP 02/17/2008   GERD 02/17/2008   BACK PAIN, CHRONIC 02/17/2008   B12 DEFICIENCY 12/30/2007   WEIGHT LOSS 12/30/2007    Gabriela Eves, PT, DPT 02/11/2020, 4:59 PM  [facility] [address] [place], [place], [address] Phone: [phone]   Fax:  [phone]  Name: Wesley Schultz MRN: [id number] Date of Birth: 07/18/1950

## 2020-02-15 ENCOUNTER — Other Ambulatory Visit: Payer: Self-pay

## 2020-02-15 ENCOUNTER — Encounter: Payer: Self-pay | Admitting: Physical Therapy

## 2020-02-15 ENCOUNTER — Ambulatory Visit: Payer: Medicare Other | Admitting: Physical Therapy

## 2020-02-15 DIAGNOSIS — M25561 Pain in right knee: Secondary | ICD-10-CM

## 2020-02-15 DIAGNOSIS — M6281 Muscle weakness (generalized): Secondary | ICD-10-CM

## 2020-02-15 DIAGNOSIS — R600 Localized edema: Secondary | ICD-10-CM

## 2020-02-15 DIAGNOSIS — G8929 Other chronic pain: Secondary | ICD-10-CM

## 2020-02-15 DIAGNOSIS — M25661 Stiffness of right knee, not elsewhere classified: Secondary | ICD-10-CM

## 2020-02-15 NOTE — Therapy (Signed)
[facility] [place], [place], [address] Phone: [phone]   Fax:  [phone]  Physical Therapy Treatment  Patient Details  Name: Wesley Schultz MRN: [id number] Date of Birth: 09-01-1950 Referring Provider (PT): Dr. Noemi Chapel, MD   Encounter Date: 02/15/2020   PT End of Session - 02/15/20 1434    Visit Number 22    Number of Visits 30    Date for PT Re-Evaluation 03/25/20    Authorization Type FOTO; progress note every 10th visit; KX modifier at 15th visit    PT Start Time 1340    PT Stop Time 1427    PT Time Calculation (min) 47 min    Activity Tolerance Patient tolerated treatment well    Behavior During Therapy [facility] for tasks assessed/performed           Past Medical History:  Diagnosis Date  . Arthritis   . Asthma   . Colon polyps   . GERD (gastroesophageal reflux disease)   . Primary localized osteoarthritis of left knee 10/05/2019    Past Surgical History:  Procedure Laterality Date  . BACK SURGERY     2008, 2009, 2010  . CATARACT EXTRACTION, BILATERAL    . CERVICAL SPINE SURGERY    . HERNIA REPAIR    . LUMBAR LAMINECTOMY      There were no vitals filed for this visit.   Subjective Assessment - 02/15/20 1350    Subjective COVID 19 screening perfromed on patient upon arrival. Patient reports forcing himself to weightbear equally through RLE. Patient reports he feels as if RLE is shorter than LLE and has difficulty if walking without shoes.    Pertinent History L TKA 12/16/2019, Asthma, history of lumbar surgery, left TKA.    Limitations Sitting;Standing;Walking;House hold activities    How long can you stand comfortably? short periods    How long can you walk comfortably? around home    Patient Stated Goals walk without AD    Currently in Pain? No/denies              [facility] PT Assessment - 02/15/20 0001      Assessment   Medical Diagnosis S/P left total knee replacement    Referring Provider (PT)  Dr. Noemi Chapel, MD    Onset Date/Surgical Date 12/16/19    Next MD Visit 02/22/2020    Prior Therapy no      Precautions   Precautions Other (comment)    Precaution Comments no ultrasound      Restrictions   Weight Bearing Restrictions No      ROM / Strength   AROM / PROM / Strength AROM      AROM   Overall AROM  Deficits    AROM Assessment Site Knee    Right/Left Knee Right    Right Knee Extension -8    Right Knee Flexion 105                         [facility] Adult PT Treatment/Exercise - 02/15/20 0001      Knee/Hip Exercises: Aerobic   Recumbent Bike L3, seat 13-10 x15 min      Knee/Hip Exercises: Standing   Forward Lunges Right;2 sets;10 reps;3 seconds    Forward Lunges Limitations off 6" step    Hip Abduction AROM;Right;20 reps;Knee straight    Forward Step Up Right;2 sets;10 reps;Step Height: 6";Hand Hold: 2    Rocker Board 3 minutes  Knee/Hip Exercises: Seated   Long Arc Quad Strengthening;Right;20 reps;Weights    Long Arc Quad Weight 4 lbs.      Knee/Hip Exercises: Supine   Straight Leg Raises AROM;Right;20 reps      Modalities   Modalities Vasopneumatic      Vasopneumatic   Number Minutes Vasopneumatic  10 minutes    Vasopnuematic Location  Knee    Vasopneumatic Pressure Low    Vasopneumatic Temperature  34 for edema                    PT Short Term Goals - 02/15/20 1415      PT SHORT TERM GOAL #1   Title Patient will ambulate with SPC with minimal gait devations and right knee pain less than or equal to 2/10.    Time 2    Period Weeks    Status Achieved      PT SHORT TERM GOAL #2   Title Patient will demonstrate 2-90 degrees of right  knee AROM to improve ability to perform functional tasks.    Baseline AROM -8 -78 degrees 02/03/20    Time 2    Period Weeks    Status Partially Met             PT Long Term Goals - 02/15/20 1416      PT LONG TERM GOAL #1   Title Patient will be independent with advanced HEP     Time 6    Period Weeks    Status On-going      PT LONG TERM GOAL #2   Title Patient will demonstrate 0-115+ degrees of right  knee AROM to improve ability to perform ADLs and functional tasks.    Time 6    Period Weeks    Status On-going      PT LONG TERM GOAL #3   Title Patient will demonstrate reciprocating stair ambulation with one rail to safely acess basement.    Time 6    Period Weeks    Status On-going      PT LONG TERM GOAL #4   Title Patient will demonstrate 4+/5 right knee MMT in all planes to improve stability during functional tasks.    Time 6    Period Weeks    Status On-going      PT LONG TERM GOAL #5   Title Patient will be independent with performing ADLs and home activities with right  knee pain less than or equal to 2/10.    Time 6    Period Weeks    Status On-going                 Plan - 02/15/20 1439    Clinical Impression Statement Patient presented in clinic with no AD and improved weightbearing. Patient able to ambulate with greater R knee flexion today as well. Patient progressed to more functional exercises as patient is able to weightbear fully through RLE. AROM of R knee measured as 8-105 deg. Normal vasopnuematic response noted following removal of the modality.    Personal Factors and Comorbidities Age;Comorbidity 2    Comorbidities L TKA 12/16/2019, Asthma, history of lumbar surgery    Examination-Activity Limitations Locomotion Level;Transfers;Stand;Stairs;Dressing    Stability/Clinical Decision Making Stable/Uncomplicated    Rehab Potential Excellent    PT Duration 6 weeks    PT Treatment/Interventions ADLs/Self Care Home Management;Cryotherapy;Electrical Stimulation;Moist Heat;Gait training;Stair training;Functional mobility training;Therapeutic activities;Therapeutic exercise;Balance training;Neuromuscular re-education;Manual techniques;Passive range of motion;Patient/family education;Scar mobilization;Vasopneumatic Device;Taping  PT  Next Visit Plan TKA protocol.  Vasopneumatic on low.    PT Home Exercise Plan see patient education section    Consulted and Agree with Plan of Care Patient           Patient will benefit from skilled therapeutic intervention in order to improve the following deficits and impairments:  Abnormal gait, Difficulty walking, Decreased range of motion, Decreased activity tolerance, Decreased balance, Decreased strength, Increased edema, Pain  Visit Diagnosis: Chronic pain of right knee  Stiffness of right knee, not elsewhere classified  Localized edema  Muscle weakness (generalized)     Problem List Patient Active Problem List   Diagnosis Date Noted  . Preoperative clearance 10/23/2019  . Family history of heart disease 10/23/2019  . Atypical chest pain 10/23/2019  . Hyperlipidemia 10/06/2019  . Smoker 10/05/2019  . Primary localized osteoarthritis of left knee 10/05/2019  . COPD, mild ([place]) 01/06/2015  . ADENOMATOUS COLONIC POLYP 02/17/2008  . GERD 02/17/2008  . BACK PAIN, CHRONIC 02/17/2008  . B12 DEFICIENCY 12/30/2007  . WEIGHT LOSS 12/30/2007    Standley Brooking, PTA 02/15/2020, 3:04 PM  [facility] [address] [place], [place], [address] Phone: [phone]   Fax:  [phone]  Name: KIREN MCISAAC MRN: [id number] Date of Birth: 1950-06-30

## 2020-02-17 ENCOUNTER — Ambulatory Visit: Payer: Medicare Other | Admitting: Physical Therapy

## 2020-02-17 ENCOUNTER — Other Ambulatory Visit: Payer: Self-pay

## 2020-02-17 DIAGNOSIS — R600 Localized edema: Secondary | ICD-10-CM

## 2020-02-17 DIAGNOSIS — G8929 Other chronic pain: Secondary | ICD-10-CM

## 2020-02-17 DIAGNOSIS — M25661 Stiffness of right knee, not elsewhere classified: Secondary | ICD-10-CM

## 2020-02-17 DIAGNOSIS — M6281 Muscle weakness (generalized): Secondary | ICD-10-CM

## 2020-02-17 DIAGNOSIS — M25561 Pain in right knee: Secondary | ICD-10-CM | POA: Diagnosis not present

## 2020-02-17 NOTE — Therapy (Signed)
[facility] [place], [place], [address] Phone: [phone]   Fax:  [phone]  Physical Therapy Treatment  Patient Details  Name: Wesley Schultz MRN: [id number] Date of Birth: 30-Jul-1950 Referring Provider (PT): Dr. Noemi Chapel, MD   Encounter Date: 02/17/2020   PT End of Session - 02/17/20 1401    Visit Number 23    Number of Visits 30    Date for PT Re-Evaluation 03/25/20    PT Start Time 0145    PT Stop Time 0228    PT Time Calculation (min) 43 min    Activity Tolerance Patient tolerated treatment well    Behavior During Therapy [facility] for tasks assessed/performed           Past Medical History:  Diagnosis Date  . Arthritis   . Asthma   . Colon polyps   . GERD (gastroesophageal reflux disease)   . Primary localized osteoarthritis of left knee 10/05/2019    Past Surgical History:  Procedure Laterality Date  . BACK SURGERY     2008, 2009, 2010  . CATARACT EXTRACTION, BILATERAL    . CERVICAL SPINE SURGERY    . HERNIA REPAIR    . LUMBAR LAMINECTOMY      There were no vitals filed for this visit.   Subjective Assessment - 02/17/20 1357    Subjective COVID 19 screening perfromed on patient upon arrival. Patient reported less difficulty walking    Pertinent History L TKA 12/16/2019, Asthma, history of lumbar surgery, left TKA.    Limitations Sitting;Standing;Walking;House hold activities    How long can you stand comfortably? short periods    How long can you walk comfortably? around home    Patient Stated Goals walk without AD    Currently in Pain? Yes    Pain Score --   unsure of number "hurts"   Pain Location Knee    Pain Orientation Right    Pain Descriptors / Indicators Discomfort    Pain Type Surgical pain    Pain Onset More than a month ago    Pain Frequency Intermittent    Aggravating Factors  prolong movement or activity    Pain Relieving Factors at rest              [facility] PT Assessment -  02/17/20 0001      AROM   AROM Assessment Site Knee    Right/Left Knee Right    Right Knee Extension -8    Right Knee Flexion 95      PROM   PROM Assessment Site Knee    Right/Left Knee Right    Left Knee Extension -5    Left Knee Flexion 106                         [facility] Adult PT Treatment/Exercise - 02/17/20 0001      Knee/Hip Exercises: Aerobic   Recumbent Bike L3x15 min      Knee/Hip Exercises: Standing   Rocker Board 3 minutes      Vasopneumatic   Number Minutes Vasopneumatic  10 minutes    Vasopnuematic Location  Knee    Vasopneumatic Pressure Low    Vasopneumatic Temperature  34 for edema      Manual Therapy   Manual Therapy Passive ROM    Joint Mobilization patella mobs in all planes to improve ROM    Passive ROM manual PROM for right knee flexion and ext with gentle holds  to improve mobility                    PT Short Term Goals - 02/17/20 1402      PT SHORT TERM GOAL #1   Title Patient will ambulate with SPC with minimal gait devations and right knee pain less than or equal to 2/10.    Time 2    Period Weeks    Status Achieved      PT SHORT TERM GOAL #2   Title Patient will demonstrate 2-90 degrees of right  knee AROM to improve ability to perform functional tasks.    Baseline AROM-8-95 degrees 02/17/20    Time 2    Status Partially Met             PT Long Term Goals - 02/17/20 1403      PT LONG TERM GOAL #1   Title Patient will be independent with advanced HEP    Time 6    Period Weeks    Status On-going      PT LONG TERM GOAL #2   Title Patient will demonstrate 0-115+ degrees of right  knee AROM to improve ability to perform ADLs and functional tasks.    Baseline AROM -8-95 degrees 02/17/20    Time 6    Period Weeks    Status On-going      PT LONG TERM GOAL #3   Title Patient will demonstrate reciprocating stair ambulation with one rail to safely acess basement.    Time 6    Period Weeks    Status On-going       PT LONG TERM GOAL #4   Title Patient will demonstrate 4+/5 right knee MMT in all planes to improve stability during functional tasks.    Time 6    Period Weeks    Status On-going      PT LONG TERM GOAL #5   Title Patient will be independent with performing ADLs and home activities with right  knee pain less than or equal to 2/10.    Time 6    Period Weeks    Status On-going                 Plan - 02/17/20 1424    Clinical Impression Statement Patient tolerated treatment well today. Patient continues sto have some discomfort in knee today. Today focused on gentle ROM to improve mobility. Patient has improved with flexion and ext active and passivey. Patient goals progressing.    Personal Factors and Comorbidities Age;Comorbidity 2    Comorbidities L TKA 12/16/2019, Asthma, history of lumbar surgery    Examination-Activity Limitations Locomotion Level;Transfers;Stand;Stairs;Dressing    Stability/Clinical Decision Making Stable/Uncomplicated    Rehab Potential Excellent    PT Duration 6 weeks    PT Treatment/Interventions ADLs/Self Care Home Management;Cryotherapy;Electrical Stimulation;Moist Heat;Gait training;Stair training;Functional mobility training;Therapeutic activities;Therapeutic exercise;Balance training;Neuromuscular re-education;Manual techniques;Passive range of motion;Patient/family education;Scar mobilization;Vasopneumatic Device;Taping    PT Next Visit Plan TKA protocol.  Vasopneumatic on low.    Consulted and Agree with Plan of Care Patient           Patient will benefit from skilled therapeutic intervention in order to improve the following deficits and impairments:  Abnormal gait, Difficulty walking, Decreased range of motion, Decreased activity tolerance, Decreased balance, Decreased strength, Increased edema, Pain  Visit Diagnosis: Chronic pain of right knee  Stiffness of right knee, not elsewhere classified  Localized edema  Muscle weakness  (generalized)  Problem List Patient Active Problem List   Diagnosis Date Noted  . Preoperative clearance 10/23/2019  . Family history of heart disease 10/23/2019  . Atypical chest pain 10/23/2019  . Hyperlipidemia 10/06/2019  . Smoker 10/05/2019  . Primary localized osteoarthritis of left knee 10/05/2019  . COPD, mild ([place]) 01/06/2015  . ADENOMATOUS COLONIC POLYP 02/17/2008  . GERD 02/17/2008  . BACK PAIN, CHRONIC 02/17/2008  . B12 DEFICIENCY 12/30/2007  . WEIGHT LOSS 12/30/2007    Juliano Mceachin P, PTA 02/17/2020, 2:30 PM  [facility] [place], [place], [address] Phone: [phone]   Fax:  [phone]  Name: Wesley Schultz MRN: [id number] Date of Birth: [birth year]

## 2020-02-23 ENCOUNTER — Other Ambulatory Visit: Payer: Self-pay

## 2020-02-23 ENCOUNTER — Ambulatory Visit: Payer: Medicare Other | Admitting: Physical Therapy

## 2020-02-23 ENCOUNTER — Encounter: Payer: Self-pay | Admitting: Physical Therapy

## 2020-02-23 DIAGNOSIS — G8929 Other chronic pain: Secondary | ICD-10-CM

## 2020-02-23 DIAGNOSIS — R600 Localized edema: Secondary | ICD-10-CM

## 2020-02-23 DIAGNOSIS — M25661 Stiffness of right knee, not elsewhere classified: Secondary | ICD-10-CM

## 2020-02-23 DIAGNOSIS — M25561 Pain in right knee: Secondary | ICD-10-CM | POA: Diagnosis not present

## 2020-02-23 DIAGNOSIS — M6281 Muscle weakness (generalized): Secondary | ICD-10-CM

## 2020-02-23 NOTE — Therapy (Signed)
[facility] [place], [place], [address] Phone: [phone]   Fax:  [phone]  Physical Therapy Treatment  Patient Details  Name: Wesley Schultz MRN: [id number] Date of Birth: Oct 03, 1950 Referring Provider (PT): Dr. Noemi Chapel, MD   Encounter Date: 02/23/2020   PT End of Session - 02/23/20 1537    Visit Number 24    Number of Visits 30    Date for PT Re-Evaluation 03/25/20    Authorization Type FOTO; progress note every 10th visit; KX modifier at 15th visit    PT Start Time 1430    PT Stop Time 1520    PT Time Calculation (min) 50 min    Activity Tolerance Patient tolerated treatment well    Behavior During Therapy [facility] for tasks assessed/performed           Past Medical History:  Diagnosis Date  . Arthritis   . Asthma   . Colon polyps   . GERD (gastroesophageal reflux disease)   . Primary localized osteoarthritis of left knee 10/05/2019    Past Surgical History:  Procedure Laterality Date  . BACK SURGERY     2008, 2009, 2010  . CATARACT EXTRACTION, BILATERAL    . CERVICAL SPINE SURGERY    . HERNIA REPAIR    . LUMBAR LAMINECTOMY      There were no vitals filed for this visit.   Subjective Assessment - 02/23/20 1441    Subjective COVID 19 screening perfromed on patient upon arrival. Patient reported MD very pleased with his progress as he is not using AD. Reports still having swelling.    Pertinent History L TKA 12/16/2019, Asthma, history of lumbar surgery, left TKA.    Limitations Sitting;Standing;Walking;House hold activities    How long can you stand comfortably? short periods    How long can you walk comfortably? around home    Patient Stated Goals walk without AD    Currently in Pain? Other (Comment)   No pain assessment provided             [facility] PT Assessment - 02/23/20 0001      Assessment   Medical Diagnosis S/P left total knee replacement    Referring Provider (PT) Dr. Noemi Chapel, MD    Onset  Date/Surgical Date 12/16/19    Next MD Visit 03/07/2020    Prior Therapy no      Precautions   Precautions Other (comment)    Precaution Comments no ultrasound      Restrictions   Weight Bearing Restrictions No                         [facility] Adult PT Treatment/Exercise - 02/23/20 0001      Knee/Hip Exercises: Aerobic   Nustep L4 x15 min, seated adjusted from progression of ROM      Knee/Hip Exercises: Machines for Strengthening   Cybex Knee Extension 10# 3x10 reps    Cybex Knee Flexion 30# 3x10 reps    Cybex Leg Press 2 pl, seat 7 x20 reps      Knee/Hip Exercises: Standing   Forward Lunges Right;2 sets;10 reps;3 seconds      Modalities   Modalities Vasopneumatic      Vasopneumatic   Number Minutes Vasopneumatic  15 minutes    Vasopnuematic Location  Knee    Vasopneumatic Pressure Low    Vasopneumatic Temperature  34 for edema  PT Short Term Goals - 02/17/20 1402      PT SHORT TERM GOAL #1   Title Patient will ambulate with SPC with minimal gait devations and right knee pain less than or equal to 2/10.    Time 2    Period Weeks    Status Achieved      PT SHORT TERM GOAL #2   Title Patient will demonstrate 2-90 degrees of right  knee AROM to improve ability to perform functional tasks.    Baseline AROM-8-95 degrees 02/17/20    Time 2    Status Partially Met             PT Long Term Goals - 02/17/20 1403      PT LONG TERM GOAL #1   Title Patient will be independent with advanced HEP    Time 6    Period Weeks    Status On-going      PT LONG TERM GOAL #2   Title Patient will demonstrate 0-115+ degrees of right  knee AROM to improve ability to perform ADLs and functional tasks.    Baseline AROM -8-95 degrees 02/17/20    Time 6    Period Weeks    Status On-going      PT LONG TERM GOAL #3   Title Patient will demonstrate reciprocating stair ambulation with one rail to safely acess basement.    Time 6    Period  Weeks    Status On-going      PT LONG TERM GOAL #4   Title Patient will demonstrate 4+/5 right knee MMT in all planes to improve stability during functional tasks.    Time 6    Period Weeks    Status On-going      PT LONG TERM GOAL #5   Title Patient will be independent with performing ADLs and home activities with right  knee pain less than or equal to 2/10.    Time 6    Period Weeks    Status On-going                 Plan - 02/23/20 1542    Clinical Impression Statement Patient continues to progress following R TKR as he does not use AD while in clinic. Patient guided through machine resisted LE strengthening with no complaints from patient. Patient reports more edema of R knee after activity and also feels as if he has LLD when barefoot. Normal modality response noted following removal of the modality.    Personal Factors and Comorbidities Age;Comorbidity 2    Comorbidities L TKA 12/16/2019, Asthma, history of lumbar surgery    Examination-Activity Limitations Locomotion Level;Transfers;Stand;Stairs;Dressing    Stability/Clinical Decision Making Stable/Uncomplicated    Rehab Potential Excellent    PT Duration 6 weeks    PT Treatment/Interventions ADLs/Self Care Home Management;Cryotherapy;Electrical Stimulation;Moist Heat;Gait training;Stair training;Functional mobility training;Therapeutic activities;Therapeutic exercise;Balance training;Neuromuscular re-education;Manual techniques;Passive range of motion;Patient/family education;Scar mobilization;Vasopneumatic Device;Taping    PT Next Visit Plan TKA protocol.  Vasopneumatic on low.    PT Home Exercise Plan see patient education section    Consulted and Agree with Plan of Care Patient           Patient will benefit from skilled therapeutic intervention in order to improve the following deficits and impairments:  Abnormal gait, Difficulty walking, Decreased range of motion, Decreased activity tolerance, Decreased balance,  Decreased strength, Increased edema, Pain  Visit Diagnosis: Chronic pain of right knee  Stiffness of right knee, not elsewhere classified  Localized edema  Muscle weakness (generalized)     Problem List Patient Active Problem List   Diagnosis Date Noted  . Preoperative clearance 10/23/2019  . Family history of heart disease 10/23/2019  . Atypical chest pain 10/23/2019  . Hyperlipidemia 10/06/2019  . Smoker 10/05/2019  . Primary localized osteoarthritis of left knee 10/05/2019  . COPD, mild ([place]) 01/06/2015  . ADENOMATOUS COLONIC POLYP 02/17/2008  . GERD 02/17/2008  . BACK PAIN, CHRONIC 02/17/2008  . B12 DEFICIENCY 12/30/2007  . WEIGHT LOSS 12/30/2007    Standley Brooking, PTA 02/23/2020, 3:47 PM  [facility] [address] [place], [place], [address] Phone: [phone]   Fax:  [phone]  Name: Wesley Schultz MRN: [id number] Date of Birth: [birth year]

## 2020-02-25 ENCOUNTER — Encounter: Payer: Self-pay | Admitting: Physical Therapy

## 2020-02-25 ENCOUNTER — Other Ambulatory Visit: Payer: Self-pay

## 2020-02-25 ENCOUNTER — Ambulatory Visit: Payer: Medicare Other | Admitting: Physical Therapy

## 2020-02-25 DIAGNOSIS — M25661 Stiffness of right knee, not elsewhere classified: Secondary | ICD-10-CM

## 2020-02-25 DIAGNOSIS — M25561 Pain in right knee: Secondary | ICD-10-CM

## 2020-02-25 DIAGNOSIS — R600 Localized edema: Secondary | ICD-10-CM

## 2020-02-25 DIAGNOSIS — M6281 Muscle weakness (generalized): Secondary | ICD-10-CM

## 2020-02-25 DIAGNOSIS — G8929 Other chronic pain: Secondary | ICD-10-CM

## 2020-02-25 NOTE — Therapy (Signed)
[facility] [place], [place], [address] Phone: [phone]   Fax:  [phone]  Physical Therapy Treatment  Patient Details  Name: Wesley Schultz MRN: [id number] Date of Birth: [birth year] Referring Provider (PT): Dr. Noemi Chapel, MD   Encounter Date: 02/25/2020   PT End of Session - 02/25/20 1432    Visit Number 25    Number of Visits 30    Date for PT Re-Evaluation 03/25/20    Authorization Type FOTO; progress note every 10th visit; KX modifier at 15th visit    Authorization Time Period visits RT knee:    2nd    PT Start Time 1431    PT Stop Time 1515    PT Time Calculation (min) 44 min    Activity Tolerance Patient tolerated treatment well    Behavior During Therapy [facility] for tasks assessed/performed           Past Medical History:  Diagnosis Date  . Arthritis   . Asthma   . Colon polyps   . GERD (gastroesophageal reflux disease)   . Primary localized osteoarthritis of left knee 10/05/2019    Past Surgical History:  Procedure Laterality Date  . BACK SURGERY     2008, 2009, 2010  . CATARACT EXTRACTION, BILATERAL    . CERVICAL SPINE SURGERY    . HERNIA REPAIR    . LUMBAR LAMINECTOMY      There were no vitals filed for this visit.   Subjective Assessment - 02/25/20 1431    Subjective COVID 19 screening perfromed on patient upon arrival. Patient reports he walked 0.5 mile prior to coming to PT.    Pertinent History L TKA 12/16/2019, Asthma, history of lumbar surgery, left TKA.    Limitations Sitting;Standing;Walking;House hold activities    How long can you stand comfortably? short periods    How long can you walk comfortably? around home    Patient Stated Goals walk without AD    Currently in Pain? Yes    Pain Score 5     Pain Location Knee    Pain Orientation Right    Pain Descriptors / Indicators Discomfort    Pain Type Surgical pain    Pain Onset More than a month ago    Pain Frequency Intermittent               [facility] PT Assessment - 02/25/20 0001      Assessment   Medical Diagnosis S/P left total knee replacement    Referring Provider (PT) Dr. Noemi Chapel, MD    Onset Date/Surgical Date 12/16/19    Next MD Visit 03/07/2020    Prior Therapy no      Precautions   Precautions Other (comment)    Precaution Comments no ultrasound      Restrictions   Weight Bearing Restrictions No      ROM / Strength   AROM / PROM / Strength AROM      AROM   Overall AROM  Within functional limits for tasks performed    AROM Assessment Site Knee    Right/Left Knee Right    Right Knee Extension 4    Right Knee Flexion 116                         [facility] Adult PT Treatment/Exercise - 02/25/20 0001      Knee/Hip Exercises: Aerobic   Recumbent Bike seat 11-9 x16 min for ROM  Knee/Hip Exercises: Machines for Strengthening   Cybex Knee Extension 10# 3x10 reps    Cybex Knee Flexion 30# 3x10 reps    Cybex Leg Press 2 pl, seat 6 x20 reps      Knee/Hip Exercises: Seated   Sit to Sand 15 reps;without UE support      Modalities   Modalities Vasopneumatic      Vasopneumatic   Number Minutes Vasopneumatic  10 minutes    Vasopnuematic Location  Knee    Vasopneumatic Pressure Low    Vasopneumatic Temperature  34 for edema                    PT Short Term Goals - 02/17/20 1402      PT SHORT TERM GOAL #1   Title Patient will ambulate with SPC with minimal gait devations and right knee pain less than or equal to 2/10.    Time 2    Period Weeks    Status Achieved      PT SHORT TERM GOAL #2   Title Patient will demonstrate 2-90 degrees of right  knee AROM to improve ability to perform functional tasks.    Baseline AROM-8-95 degrees 02/17/20    Time 2    Status Partially Met             PT Long Term Goals - 02/17/20 1403      PT LONG TERM GOAL #1   Title Patient will be independent with advanced HEP    Time 6    Period Weeks    Status On-going      PT LONG  TERM GOAL #2   Title Patient will demonstrate 0-115+ degrees of right  knee AROM to improve ability to perform ADLs and functional tasks.    Baseline AROM -8-95 degrees 02/17/20    Time 6    Period Weeks    Status On-going      PT LONG TERM GOAL #3   Title Patient will demonstrate reciprocating stair ambulation with one rail to safely acess basement.    Time 6    Period Weeks    Status On-going      PT LONG TERM GOAL #4   Title Patient will demonstrate 4+/5 right knee MMT in all planes to improve stability during functional tasks.    Time 6    Period Weeks    Status On-going      PT LONG TERM GOAL #5   Title Patient will be independent with performing ADLs and home activities with right  knee pain less than or equal to 2/10.    Time 6    Period Weeks    Status On-going                 Plan - 02/25/20 1514    Clinical Impression Statement Patient presented in clinic with mid level R knee pain. Patient sleeping in bed but reports having difficulty getting comfortable as well as with edema during day activities. Patient requires some rest breaks during machine knee strengthening for fatigue. AROM of R knee measured as 4-116 deg. Normal vasopneumatic response noted following removal of the modality.    Personal Factors and Comorbidities Age;Comorbidity 2    Comorbidities L TKA 12/16/2019, Asthma, history of lumbar surgery    Examination-Activity Limitations Locomotion Level;Transfers;Stand;Stairs;Dressing    Stability/Clinical Decision Making Stable/Uncomplicated    Rehab Potential Excellent    PT Duration 6 weeks    PT Treatment/Interventions ADLs/Self Care Home  Management;Cryotherapy;Electrical Stimulation;Moist Heat;Gait training;Stair training;Functional mobility training;Therapeutic activities;Therapeutic exercise;Balance training;Neuromuscular re-education;Manual techniques;Passive range of motion;Patient/family education;Scar mobilization;Vasopneumatic Device;Taping    PT  Next Visit Plan TKA protocol.  Vasopneumatic on low.    PT Home Exercise Plan see patient education section    Consulted and Agree with Plan of Care Patient           Patient will benefit from skilled therapeutic intervention in order to improve the following deficits and impairments:  Abnormal gait, Difficulty walking, Decreased range of motion, Decreased activity tolerance, Decreased balance, Decreased strength, Increased edema, Pain  Visit Diagnosis: Chronic pain of right knee  Stiffness of right knee, not elsewhere classified  Localized edema  Muscle weakness (generalized)     Problem List Patient Active Problem List   Diagnosis Date Noted  . Preoperative clearance 10/23/2019  . Family history of heart disease 10/23/2019  . Atypical chest pain 10/23/2019  . Hyperlipidemia 10/06/2019  . Smoker 10/05/2019  . Primary localized osteoarthritis of left knee 10/05/2019  . COPD, mild ([place]) 01/06/2015  . ADENOMATOUS COLONIC POLYP 02/17/2008  . GERD 02/17/2008  . BACK PAIN, CHRONIC 02/17/2008  . B12 DEFICIENCY 12/30/2007  . WEIGHT LOSS 12/30/2007    Standley Brooking, PTA 02/25/2020, 3:26 PM  [facility] [address] [place], [place], [address] Phone: [phone]   Fax:  [phone]  Name: Wesley Schultz MRN: [id number] Date of Birth: January 07, 1951

## 2020-03-01 ENCOUNTER — Ambulatory Visit: Payer: Medicare Other | Attending: Physician Assistant | Admitting: Physical Therapy

## 2020-03-01 ENCOUNTER — Other Ambulatory Visit: Payer: Self-pay

## 2020-03-01 DIAGNOSIS — M25561 Pain in right knee: Secondary | ICD-10-CM | POA: Diagnosis not present

## 2020-03-01 DIAGNOSIS — G8929 Other chronic pain: Secondary | ICD-10-CM | POA: Insufficient documentation

## 2020-03-01 DIAGNOSIS — M6281 Muscle weakness (generalized): Secondary | ICD-10-CM | POA: Insufficient documentation

## 2020-03-01 DIAGNOSIS — M25562 Pain in left knee: Secondary | ICD-10-CM | POA: Diagnosis present

## 2020-03-01 DIAGNOSIS — M25662 Stiffness of left knee, not elsewhere classified: Secondary | ICD-10-CM | POA: Diagnosis present

## 2020-03-01 DIAGNOSIS — R600 Localized edema: Secondary | ICD-10-CM | POA: Insufficient documentation

## 2020-03-01 DIAGNOSIS — M25661 Stiffness of right knee, not elsewhere classified: Secondary | ICD-10-CM | POA: Diagnosis present

## 2020-03-01 NOTE — Therapy (Signed)
[facility] [place], [place], [address] Phone: [phone]   Fax:  [phone]  Physical Therapy Treatment  Patient Details  Name: Wesley Schultz MRN: [id number] Date of Birth: 09-27-1950 Referring Provider (PT): Dr. Noemi Chapel, MD   Encounter Date: 03/01/2020   PT End of Session - 03/01/20 1456    Visit Number 26    Number of Visits 30    Date for PT Re-Evaluation 03/25/20    Authorization Type FOTO; progress note every 10th visit; KX modifier at 15th visit    PT Start Time 0230    PT Stop Time 0316    PT Time Calculation (min) 46 min    Activity Tolerance Patient tolerated treatment well    Behavior During Therapy [facility] for tasks assessed/performed           Past Medical History:  Diagnosis Date  . Arthritis   . Asthma   . Colon polyps   . GERD (gastroesophageal reflux disease)   . Primary localized osteoarthritis of left knee 10/05/2019    Past Surgical History:  Procedure Laterality Date  . BACK SURGERY     2008, 2009, 2010  . CATARACT EXTRACTION, BILATERAL    . CERVICAL SPINE SURGERY    . HERNIA REPAIR    . LUMBAR LAMINECTOMY      There were no vitals filed for this visit.   Subjective Assessment - 03/01/20 1455    Subjective COVID-19 screen performed prior to patient entering clinic.  Waking up with pain.    Pertinent History L TKA 12/16/2019, Asthma, history of lumbar surgery, left TKA.    Limitations Sitting;Standing;Walking;House hold activities    How long can you stand comfortably? short periods    How long can you walk comfortably? around home    Patient Stated Goals walk without AD    Currently in Pain? Yes    Pain Score 5                              [facility] Adult PT Treatment/Exercise - 03/01/20 0001      Exercises   Exercises Knee/Hip      Knee/Hip Exercises: Aerobic   Recumbent Bike 15 minutes.      Knee/Hip Exercises: Machines for Strengthening   Cybex Knee Extension  10# x 3 minutes.    Cybex Knee Flexion 40# x 3 minutes.      Vasopneumatic   Number Minutes Vasopneumatic  20 minutes    Vasopnuematic Location  --   Right knee.   Vasopneumatic Pressure Medium      Manual Therapy   Manual Therapy Passive ROM    Passive ROM In supine::  Passive flexion and extension to patient tolerance x 2 minutes to patient's right knee.                    PT Short Term Goals - 02/17/20 1402      PT SHORT TERM GOAL #1   Title Patient will ambulate with SPC with minimal gait devations and right knee pain less than or equal to 2/10.    Time 2    Period Weeks    Status Achieved      PT SHORT TERM GOAL #2   Title Patient will demonstrate 2-90 degrees of right  knee AROM to improve ability to perform functional tasks.    Baseline AROM-8-95 degrees 02/17/20    Time 2  Status Partially Met             PT Long Term Goals - 02/17/20 1403      PT LONG TERM GOAL #1   Title Patient will be independent with advanced HEP    Time 6    Period Weeks    Status On-going      PT LONG TERM GOAL #2   Title Patient will demonstrate 0-115+ degrees of right  knee AROM to improve ability to perform ADLs and functional tasks.    Baseline AROM -8-95 degrees 02/17/20    Time 6    Period Weeks    Status On-going      PT LONG TERM GOAL #3   Title Patient will demonstrate reciprocating stair ambulation with one rail to safely acess basement.    Time 6    Period Weeks    Status On-going      PT LONG TERM GOAL #4   Title Patient will demonstrate 4+/5 right knee MMT in all planes to improve stability during functional tasks.    Time 6    Period Weeks    Status On-going      PT LONG TERM GOAL #5   Title Patient will be independent with performing ADLs and home activities with right  knee pain less than or equal to 2/10.    Time 6    Period Weeks    Status On-going                 Plan - 03/01/20 1502    Clinical Impression Statement Patient  waking with right knee painand continues to c/o his right knee swelling.  Patient able to perform all exercises without complaint.    Personal Factors and Comorbidities Age;Comorbidity 2    Comorbidities L TKA 12/16/2019, Asthma, history of lumbar surgery    Examination-Activity Limitations Locomotion Level;Transfers;Stand;Stairs;Dressing    Stability/Clinical Decision Making Stable/Uncomplicated    Rehab Potential Excellent    PT Duration 6 weeks    PT Treatment/Interventions ADLs/Self Care Home Management;Cryotherapy;Electrical Stimulation;Moist Heat;Gait training;Stair training;Functional mobility training;Therapeutic activities;Therapeutic exercise;Balance training;Neuromuscular re-education;Manual techniques;Passive range of motion;Patient/family education;Scar mobilization;Vasopneumatic Device;Taping    PT Next Visit Plan TKA protocol.  Vasopneumatic on low.    PT Home Exercise Plan see patient education section    Consulted and Agree with Plan of Care Patient           Patient will benefit from skilled therapeutic intervention in order to improve the following deficits and impairments:  Abnormal gait, Difficulty walking, Decreased range of motion, Decreased activity tolerance, Decreased balance, Decreased strength, Increased edema, Pain  Visit Diagnosis: Chronic pain of right knee  Stiffness of right knee, not elsewhere classified  Localized edema     Problem List Patient Active Problem List   Diagnosis Date Noted  . Preoperative clearance 10/23/2019  . Family history of heart disease 10/23/2019  . Atypical chest pain 10/23/2019  . Hyperlipidemia 10/06/2019  . Smoker 10/05/2019  . Primary localized osteoarthritis of left knee 10/05/2019  . COPD, mild ([place]) 01/06/2015  . ADENOMATOUS COLONIC POLYP 02/17/2008  . GERD 02/17/2008  . BACK PAIN, CHRONIC 02/17/2008  . B12 DEFICIENCY 12/30/2007  . WEIGHT LOSS 12/30/2007    Dianca Owensby, [place] MPT 03/01/2020, 3:22 PM  [facility] [address] [place], [place], [address] Phone: [phone]   Fax:  [phone]  Name: Wesley Schultz MRN: [id number] Date of Birth: 1950/09/20

## 2020-03-03 ENCOUNTER — Ambulatory Visit: Payer: Medicare Other | Admitting: Physical Therapy

## 2020-03-03 ENCOUNTER — Encounter: Payer: Self-pay | Admitting: Physical Therapy

## 2020-03-03 ENCOUNTER — Other Ambulatory Visit: Payer: Self-pay

## 2020-03-03 DIAGNOSIS — G8929 Other chronic pain: Secondary | ICD-10-CM

## 2020-03-03 DIAGNOSIS — M25661 Stiffness of right knee, not elsewhere classified: Secondary | ICD-10-CM

## 2020-03-03 DIAGNOSIS — R600 Localized edema: Secondary | ICD-10-CM

## 2020-03-03 DIAGNOSIS — M25561 Pain in right knee: Secondary | ICD-10-CM | POA: Diagnosis not present

## 2020-03-03 NOTE — Therapy (Signed)
[facility] [place], [place], [address] Phone: [phone]   Fax:  [phone]  Physical Therapy Treatment  Patient Details  Name: Wesley Schultz MRN: [id number] Date of Birth: August 20, 1950 Referring Provider (PT): Dr. Noemi Chapel, MD   Encounter Date: 03/03/2020   PT End of Session - 03/03/20 1503    Visit Number 27    Number of Visits 30    Date for PT Re-Evaluation 03/25/20    Authorization Type FOTO; progress note every 10th visit; KX modifier at 15th visit    PT Start Time 0230    PT Stop Time 0318    PT Time Calculation (min) 48 min    Activity Tolerance Patient tolerated treatment well    Behavior During Therapy [facility] for tasks assessed/performed           Past Medical History:  Diagnosis Date  . Arthritis   . Asthma   . Colon polyps   . GERD (gastroesophageal reflux disease)   . Primary localized osteoarthritis of left knee 10/05/2019    Past Surgical History:  Procedure Laterality Date  . BACK SURGERY     2008, 2009, 2010  . CATARACT EXTRACTION, BILATERAL    . CERVICAL SPINE SURGERY    . HERNIA REPAIR    . LUMBAR LAMINECTOMY      There were no vitals filed for this visit.   Subjective Assessment - 03/03/20 1458    Subjective COVID-19 screen performed prior to patient entering clinic.  Not bad today.    Pertinent History L TKA 12/16/2019, Asthma, history of lumbar surgery, left TKA.    Limitations Sitting;Standing;Walking;House hold activities    How long can you stand comfortably? short periods    How long can you walk comfortably? around home    Patient Stated Goals walk without AD    Currently in Pain? Yes    Pain Score 4     Pain Location Knee    Pain Orientation Right    Pain Descriptors / Indicators Discomfort    Pain Type Surgical pain    Pain Onset More than a month ago                             [facility] Adult PT Treatment/Exercise - 03/03/20 0001      Exercises    Exercises Knee/Hip      Knee/Hip Exercises: Aerobic   Recumbent Bike 15 minutes.      Knee/Hip Exercises: Machines for Strengthening   Cybex Knee Extension 10# x 3 minutes.    Cybex Knee Flexion 40# x 3 minutes.      Manual Therapy   Manual Therapy Passive ROM    Passive ROM In supine:  PROM x 5 minutes into flexion and extension.                    PT Short Term Goals - 02/17/20 1402      PT SHORT TERM GOAL #1   Title Patient will ambulate with SPC with minimal gait devations and right knee pain less than or equal to 2/10.    Time 2    Period Weeks    Status Achieved      PT SHORT TERM GOAL #2   Title Patient will demonstrate 2-90 degrees of right  knee AROM to improve ability to perform functional tasks.    Baseline AROM-8-95 degrees 02/17/20    Time 2  Status Partially Met             PT Long Term Goals - 02/17/20 1403      PT LONG TERM GOAL #1   Title Patient will be independent with advanced HEP    Time 6    Period Weeks    Status On-going      PT LONG TERM GOAL #2   Title Patient will demonstrate 0-115+ degrees of right  knee AROM to improve ability to perform ADLs and functional tasks.    Baseline AROM -8-95 degrees 02/17/20    Time 6    Period Weeks    Status On-going      PT LONG TERM GOAL #3   Title Patient will demonstrate reciprocating stair ambulation with one rail to safely acess basement.    Time 6    Period Weeks    Status On-going      PT LONG TERM GOAL #4   Title Patient will demonstrate 4+/5 right knee MMT in all planes to improve stability during functional tasks.    Time 6    Period Weeks    Status On-going      PT LONG TERM GOAL #5   Title Patient will be independent with performing ADLs and home activities with right  knee pain less than or equal to 2/10.    Time 6    Period Weeks    Status On-going                 Plan - 03/03/20 1505    Clinical Impression Statement The patient is doing very well.  His  range of motion is improving nicely.    Personal Factors and Comorbidities Age;Comorbidity 2    Comorbidities L TKA 12/16/2019, Asthma, history of lumbar surgery    Stability/Clinical Decision Making Stable/Uncomplicated    Rehab Potential Excellent    PT Duration 6 weeks    PT Treatment/Interventions ADLs/Self Care Home Management;Cryotherapy;Electrical Stimulation;Moist Heat;Gait training;Stair training;Functional mobility training;Therapeutic activities;Therapeutic exercise;Balance training;Neuromuscular re-education;Manual techniques;Passive range of motion;Patient/family education;Scar mobilization;Vasopneumatic Device;Taping    PT Next Visit Plan TKA protocol.  Vasopneumatic on low.    Consulted and Agree with Plan of Care Patient           Patient will benefit from skilled therapeutic intervention in order to improve the following deficits and impairments:  Abnormal gait, Difficulty walking, Decreased range of motion, Decreased activity tolerance, Decreased balance, Decreased strength, Increased edema, Pain  Visit Diagnosis: Chronic pain of right knee  Stiffness of right knee, not elsewhere classified  Localized edema     Problem List Patient Active Problem List   Diagnosis Date Noted  . Preoperative clearance 10/23/2019  . Family history of heart disease 10/23/2019  . Atypical chest pain 10/23/2019  . Hyperlipidemia 10/06/2019  . Smoker 10/05/2019  . Primary localized osteoarthritis of left knee 10/05/2019  . COPD, mild ([place]) 01/06/2015  . ADENOMATOUS COLONIC POLYP 02/17/2008  . GERD 02/17/2008  . BACK PAIN, CHRONIC 02/17/2008  . B12 DEFICIENCY 12/30/2007  . WEIGHT LOSS 12/30/2007    Kurstyn Larios, [place] MPT 03/03/2020, 3:40 PM  [facility] [address] [place], [place], [address] Phone: [phone]   Fax:  [phone]  Name: ORDELL PRICHETT MRN: [id number] Date of Birth: 10-Dec-1950

## 2020-03-08 ENCOUNTER — Other Ambulatory Visit: Payer: Self-pay

## 2020-03-08 ENCOUNTER — Ambulatory Visit: Payer: Medicare Other | Admitting: *Deleted

## 2020-03-08 DIAGNOSIS — M25661 Stiffness of right knee, not elsewhere classified: Secondary | ICD-10-CM

## 2020-03-08 DIAGNOSIS — M25561 Pain in right knee: Secondary | ICD-10-CM | POA: Diagnosis not present

## 2020-03-08 DIAGNOSIS — G8929 Other chronic pain: Secondary | ICD-10-CM

## 2020-03-08 DIAGNOSIS — R600 Localized edema: Secondary | ICD-10-CM

## 2020-03-08 DIAGNOSIS — M6281 Muscle weakness (generalized): Secondary | ICD-10-CM

## 2020-03-08 NOTE — Therapy (Signed)
[facility] [place], [place], [address] Phone: [phone]   Fax:  [phone]  Physical Therapy Treatment  Patient Details  Name: Wesley Schultz MRN: [id number] Date of Birth: 08-25-1950 Referring Provider (PT): Dr. Noemi Chapel, MD   Encounter Date: 03/08/2020   PT End of Session - 03/08/20 1638    Visit Number 28    Number of Visits 30    Date for PT Re-Evaluation 03/25/20    Authorization Type FOTO; progress note every 10th visit; KX modifier at 15th visit    Authorization Time Period visits RT knee:    2nd    PT Start Time 1430    PT Stop Time 1520    PT Time Calculation (min) 50 min           Past Medical History:  Diagnosis Date  . Arthritis   . Asthma   . Colon polyps   . GERD (gastroesophageal reflux disease)   . Primary localized osteoarthritis of left knee 10/05/2019    Past Surgical History:  Procedure Laterality Date  . BACK SURGERY     2008, 2009, 2010  . CATARACT EXTRACTION, BILATERAL    . CERVICAL SPINE SURGERY    . HERNIA REPAIR    . LUMBAR LAMINECTOMY      There were no vitals filed for this visit.   Subjective Assessment - 03/08/20 1430    Subjective COVID-19 screen performed prior to patient entering clinic. Went to MD yesterday and gave me meds for swelling. F/U in 3 weeks    Pertinent History L TKA 12/16/2019, Asthma, history of lumbar surgery, left TKA.    Limitations Sitting;Standing;Walking;House hold activities    How long can you stand comfortably? short periods    How long can you walk comfortably? around home    Patient Stated Goals walk without AD    Pain Score 4     Pain Location Knee    Pain Orientation Right    Pain Type Surgical pain                             [facility] Adult PT Treatment/Exercise - 03/08/20 0001      Exercises   Exercises Knee/Hip      Knee/Hip Exercises: Aerobic   Recumbent Bike 15 minutes. seat 8-6      Knee/Hip Exercises: Standing     Knee Flexion Strengthening;Right;3 sets;10 reps    Knee Flexion Limitations 2#    Lateral Step Up --    Forward Step Up Right;3 sets;10 reps;Hand Hold: 1;Step Height: 6"    Step Down Right;3 sets;10 reps;Step Height: 4";Hand Hold: 1    Rocker Board 3 minutes      Vasopneumatic   Number Minutes Vasopneumatic  15 minutes    Vasopnuematic Location  --   Right knee.   Vasopneumatic Pressure Medium    Vasopneumatic Temperature  34 for edema      Manual Therapy   Manual Therapy Passive ROM    Passive ROM In supine:  PROM into flexion  to 115 degrees today                    PT Short Term Goals - 02/17/20 1402      PT SHORT TERM GOAL #1   Title Patient will ambulate with SPC with minimal gait devations and right knee pain less than or equal to 2/10.    Time 2  Period Weeks    Status Achieved      PT SHORT TERM GOAL #2   Title Patient will demonstrate 2-90 degrees of right  knee AROM to improve ability to perform functional tasks.    Baseline AROM-8-95 degrees 02/17/20    Time 2    Status Partially Met             PT Long Term Goals - 02/17/20 1403      PT LONG TERM GOAL #1   Title Patient will be independent with advanced HEP    Time 6    Period Weeks    Status On-going      PT LONG TERM GOAL #2   Title Patient will demonstrate 0-115+ degrees of right  knee AROM to improve ability to perform ADLs and functional tasks.    Baseline AROM -8-95 degrees 02/17/20    Time 6    Period Weeks    Status On-going      PT LONG TERM GOAL #3   Title Patient will demonstrate reciprocating stair ambulation with one rail to safely acess basement.    Time 6    Period Weeks    Status On-going      PT LONG TERM GOAL #4   Title Patient will demonstrate 4+/5 right knee MMT in all planes to improve stability during functional tasks.    Time 6    Period Weeks    Status On-going      PT LONG TERM GOAL #5   Title Patient will be independent with performing ADLs and home  activities with right  knee pain less than or equal to 2/10.    Time 6    Period Weeks    Status On-going                 Plan - 03/08/20 1640    Clinical Impression Statement Pt arived today doing fairly well and was able to progress CKC strengthening today and PROM was 115 degrees. Vaso for edem tolerated well    Personal Factors and Comorbidities Age;Comorbidity 2    Comorbidities L TKA 12/16/2019, Asthma, history of lumbar surgery    Examination-Activity Limitations Locomotion Level;Transfers;Stand;Stairs;Dressing    Stability/Clinical Decision Making Stable/Uncomplicated    Rehab Potential Excellent    PT Duration 6 weeks    PT Treatment/Interventions ADLs/Self Care Home Management;Cryotherapy;Electrical Stimulation;Moist Heat;Gait training;Stair training;Functional mobility training;Therapeutic activities;Therapeutic exercise;Balance training;Neuromuscular re-education;Manual techniques;Passive range of motion;Patient/family education;Scar mobilization;Vasopneumatic Device;Taping    PT Next Visit Plan TKA protocol.  Vasopneumatic on low.     Recert for 4 more visits for total of 34?    Consulted and Agree with Plan of Care Patient           Patient will benefit from skilled therapeutic intervention in order to improve the following deficits and impairments:  Abnormal gait, Difficulty walking, Decreased range of motion, Decreased activity tolerance, Decreased balance, Decreased strength, Increased edema, Pain  Visit Diagnosis: Chronic pain of right knee  Stiffness of right knee, not elsewhere classified  Localized edema  Muscle weakness (generalized)     Problem List Patient Active Problem List   Diagnosis Date Noted  . Preoperative clearance 10/23/2019  . Family history of heart disease 10/23/2019  . Atypical chest pain 10/23/2019  . Hyperlipidemia 10/06/2019  . Smoker 10/05/2019  . Primary localized osteoarthritis of left knee 10/05/2019  . COPD, mild ([facility])  01/06/2015  . ADENOMATOUS COLONIC POLYP 02/17/2008  . GERD 02/17/2008  . BACK PAIN,  CHRONIC 02/17/2008  . B12 DEFICIENCY 12/30/2007  . WEIGHT LOSS 12/30/2007    Myron Lona,CHRIS, PTA 03/08/2020, 4:49 PM  [facility] [address] [place], [place], [address] Phone: [phone]   Fax:  [phone]  Name: Wesley Schultz MRN: [id number] Date of Birth: 12/29/1950

## 2020-03-10 ENCOUNTER — Ambulatory Visit: Payer: Medicare Other | Admitting: Physical Therapy

## 2020-03-10 ENCOUNTER — Other Ambulatory Visit: Payer: Self-pay

## 2020-03-10 DIAGNOSIS — M25561 Pain in right knee: Secondary | ICD-10-CM | POA: Diagnosis not present

## 2020-03-10 DIAGNOSIS — G8929 Other chronic pain: Secondary | ICD-10-CM

## 2020-03-10 DIAGNOSIS — M6281 Muscle weakness (generalized): Secondary | ICD-10-CM

## 2020-03-10 DIAGNOSIS — R600 Localized edema: Secondary | ICD-10-CM

## 2020-03-10 DIAGNOSIS — M25661 Stiffness of right knee, not elsewhere classified: Secondary | ICD-10-CM

## 2020-03-10 NOTE — Therapy (Signed)
[facility] [place], [place], [address] Phone: [phone]   Fax:  [phone]  Physical Therapy Treatment  Patient Details  Name: Wesley Schultz MRN: [id number] Date of Birth: [birth year] Referring Provider (PT): Dr. Noemi Chapel, MD   Encounter Date: 03/10/2020   PT End of Session - 03/10/20 1436    Visit Number 29    Number of Visits 34    Date for PT Re-Evaluation 03/25/20    Authorization Type FOTO; progress note every 10th visit; KX modifier at 15th visit    PT Start Time 0230    PT Stop Time 0313    PT Time Calculation (min) 43 min    Activity Tolerance Patient tolerated treatment well    Behavior During Therapy [facility] for tasks assessed/performed           Past Medical History:  Diagnosis Date  . Arthritis   . Asthma   . Colon polyps   . GERD (gastroesophageal reflux disease)   . Primary localized osteoarthritis of left knee 10/05/2019    Past Surgical History:  Procedure Laterality Date  . BACK SURGERY     2008, 2009, 2010  . CATARACT EXTRACTION, BILATERAL    . CERVICAL SPINE SURGERY    . HERNIA REPAIR    . LUMBAR LAMINECTOMY      There were no vitals filed for this visit.   Subjective Assessment - 03/10/20 1435    Subjective COVID-19 screen performed prior to patient entering clinic. Patient arrived with ongoing pain in knee. "always hurts"    Pertinent History L TKA 12/16/2019, Asthma, history of lumbar surgery, left TKA.    Limitations Sitting;Standing;Walking;House hold activities    How long can you stand comfortably? short periods    How long can you walk comfortably? around home    Patient Stated Goals walk without AD    Currently in Pain? Yes    Pain Score 5     Pain Location Knee    Pain Orientation Right    Pain Descriptors / Indicators Discomfort    Pain Type Surgical pain    Pain Onset More than a month ago    Pain Frequency Intermittent    Aggravating Factors  prolong activity    Pain  Relieving Factors rest              [facility] PT Assessment - 03/10/20 0001      AROM   AROM Assessment Site Knee    Right/Left Knee Right    Right Knee Extension -6    Right Knee Flexion 105      PROM   PROM Assessment Site Knee    Right/Left Knee Right    Right Knee Extension -4    Right Knee Flexion 116                         [facility] Adult PT Treatment/Exercise - 03/10/20 0001      Knee/Hip Exercises: Aerobic   Recumbent Bike L3 x 11mn adjusted for ROM      Knee/Hip Exercises: Standing   Rocker Board 3 minutes      Vasopneumatic   Number Minutes Vasopneumatic  15 minutes    Vasopnuematic Location  Knee    Vasopneumatic Pressure Medium    Vasopneumatic Temperature  34 for edema      Manual Therapy   Manual Therapy Passive ROM    Passive ROM Manual PROM for right knee flexion and  ext with holds to improve mobility.                    PT Short Term Goals - 03/10/20 1437      PT SHORT TERM GOAL #1   Title Patient will ambulate with SPC with minimal gait devations and right knee pain less than or equal to 2/10.    Time 2    Period Weeks    Status Achieved      PT SHORT TERM GOAL #2   Title Patient will demonstrate 2-90 degrees of right  knee AROM to improve ability to perform functional tasks.    Baseline AROM-6-105 degrees 03/10/20    Time 2    Period Weeks    Status Partially Met             PT Long Term Goals - 03/10/20 1438      PT LONG TERM GOAL #1   Title Patient will be independent with advanced HEP    Time 6    Period Weeks    Status On-going      PT LONG TERM GOAL #2   Title Patient will demonstrate 0-115+ degrees of right  knee AROM to improve ability to perform ADLs and functional tasks.    Baseline AROM -6-105 degrees 03/10/20    Time 6    Period Weeks    Status On-going      PT LONG TERM GOAL #3   Title Patient will demonstrate reciprocating stair ambulation with one rail to safely acess basement.     Baseline Met 03/10/20    Time 6    Period Weeks    Status Achieved      PT LONG TERM GOAL #4   Title Patient will demonstrate 4+/5 right knee MMT in all planes to improve stability during functional tasks.    Time 6    Period Weeks    Status On-going      PT LONG TERM GOAL #5   Title Patient will be independent with performing ADLs and home activities with right  knee pain less than or equal to 2/10.    Baseline pain 4-5/10 with ADL's 03/10/20    Time 6    Period Weeks    Status On-going                 Plan - 03/10/20 1507    Clinical Impression Statement Patient tolerated treatment well today. Patient progressing overall with improved ROM and activity tolerance. Patient continues to have difficulty with swelling in right knee. Patient is able to perfrom stairs with greater ease. Today met LTG # 3 with remaining goals progressing.    Personal Factors and Comorbidities Age;Comorbidity 2    Comorbidities L TKA 12/16/2019, Asthma, history of lumbar surgery    Examination-Activity Limitations Locomotion Level;Transfers;Stand;Stairs;Dressing    Stability/Clinical Decision Making Stable/Uncomplicated    Rehab Potential Excellent    PT Duration 6 weeks    PT Treatment/Interventions ADLs/Self Care Home Management;Cryotherapy;Electrical Stimulation;Moist Heat;Gait training;Stair training;Functional mobility training;Therapeutic activities;Therapeutic exercise;Balance training;Neuromuscular re-education;Manual techniques;Passive range of motion;Patient/family education;Scar mobilization;Vasopneumatic Device;Taping    PT Next Visit Plan TKA protocol. progress ROM and strengthening Vasopneumatic on low.    Consulted and Agree with Plan of Care Patient           Patient will benefit from skilled therapeutic intervention in order to improve the following deficits and impairments:  Abnormal gait, Difficulty walking, Decreased range of motion, Decreased activity tolerance, Decreased  balance, Decreased strength, Increased edema, Pain  Visit Diagnosis: Chronic pain of right knee  Stiffness of right knee, not elsewhere classified  Localized edema  Muscle weakness (generalized)     Problem List Patient Active Problem List   Diagnosis Date Noted  . Preoperative clearance 10/23/2019  . Family history of heart disease 10/23/2019  . Atypical chest pain 10/23/2019  . Hyperlipidemia 10/06/2019  . Smoker 10/05/2019  . Primary localized osteoarthritis of left knee 10/05/2019  . COPD, mild ([place]) 01/06/2015  . ADENOMATOUS COLONIC POLYP 02/17/2008  . GERD 02/17/2008  . BACK PAIN, CHRONIC 02/17/2008  . B12 DEFICIENCY 12/30/2007  . WEIGHT LOSS 12/30/2007    Andoni Busch P, PTA 03/10/2020, 3:14 PM  [facility] [place], [place], [address] Phone: [phone]   Fax:  [phone]  Name: Wesley Schultz MRN: [id number] Date of Birth: [birth year]

## 2020-03-15 ENCOUNTER — Ambulatory Visit: Payer: Medicare Other | Admitting: Physical Therapy

## 2020-03-15 ENCOUNTER — Other Ambulatory Visit: Payer: Self-pay

## 2020-03-15 DIAGNOSIS — M25561 Pain in right knee: Secondary | ICD-10-CM

## 2020-03-15 DIAGNOSIS — M25661 Stiffness of right knee, not elsewhere classified: Secondary | ICD-10-CM

## 2020-03-15 DIAGNOSIS — G8929 Other chronic pain: Secondary | ICD-10-CM

## 2020-03-15 DIAGNOSIS — R600 Localized edema: Secondary | ICD-10-CM

## 2020-03-15 NOTE — Therapy (Signed)
[facility] [place], [place], [address] Phone: [phone]   Fax:  [phone]  Physical Therapy Treatment  Patient Details  Name: Wesley Schultz MRN: [id number] Date of Birth: 27-Sep-1950 Referring Provider (PT): Dr. Noemi Chapel, MD   Encounter Date: 03/15/2020   PT End of Session - 03/15/20 1507    Visit Number 30    Number of Visits 34    Date for PT Re-Evaluation 03/25/20    Authorization Type FOTO; progress note every 10th visit; KX modifier at 15th visit    Authorization Time Period visits RT knee:    2nd    PT Start Time 0225    PT Stop Time 0311    PT Time Calculation (min) 46 min    Activity Tolerance Patient tolerated treatment well    Behavior During Therapy [facility] for tasks assessed/performed           Past Medical History:  Diagnosis Date  . Arthritis   . Asthma   . Colon polyps   . GERD (gastroesophageal reflux disease)   . Primary localized osteoarthritis of left knee 10/05/2019    Past Surgical History:  Procedure Laterality Date  . BACK SURGERY     2008, 2009, 2010  . CATARACT EXTRACTION, BILATERAL    . CERVICAL SPINE SURGERY    . HERNIA REPAIR    . LUMBAR LAMINECTOMY      There were no vitals filed for this visit.   Subjective Assessment - 03/15/20 1446    Subjective COVID-19 screen performed prior to patient entering clinic.  Doing good.    Pertinent History L TKA 12/16/2019, Asthma, history of lumbar surgery, left TKA.    Limitations Sitting;Standing;Walking;House hold activities    How long can you stand comfortably? short periods    How long can you walk comfortably? around home    Patient Stated Goals walk without AD    Currently in Pain? Yes    Pain Score 4     Pain Location Knee    Pain Orientation Right    Pain Descriptors / Indicators Discomfort    Pain Type Surgical pain    Pain Onset More than a month ago                             [facility] Adult PT  Treatment/Exercise - 03/15/20 0001      Exercises   Exercises Knee/Hip      Knee/Hip Exercises: Aerobic   Recumbent Bike Level 2 x 15 minutes.      Knee/Hip Exercises: Machines for Strengthening   Cybex Knee Extension 10# x 3 minutes.    Cybex Knee Flexion 40# x 3 minutes.    Cybex Leg Press 2 plates x 3 minutes.      Vasopneumatic   Number Minutes Vasopneumatic  20 minutes    Vasopnuematic Location  --   Right knee.   Vasopneumatic Pressure Medium                    PT Short Term Goals - 03/10/20 1437      PT SHORT TERM GOAL #1   Title Patient will ambulate with SPC with minimal gait devations and right knee pain less than or equal to 2/10.    Time 2    Period Weeks    Status Achieved      PT SHORT TERM GOAL #2   Title Patient will demonstrate  2-90 degrees of right  knee AROM to improve ability to perform functional tasks.    Baseline AROM-6-105 degrees 03/10/20    Time 2    Period Weeks    Status Partially Met             PT Long Term Goals - 03/10/20 1438      PT LONG TERM GOAL #1   Title Patient will be independent with advanced HEP    Time 6    Period Weeks    Status On-going      PT LONG TERM GOAL #2   Title Patient will demonstrate 0-115+ degrees of right  knee AROM to improve ability to perform ADLs and functional tasks.    Baseline AROM -6-105 degrees 03/10/20    Time 6    Period Weeks    Status On-going      PT LONG TERM GOAL #3   Title Patient will demonstrate reciprocating stair ambulation with one rail to safely acess basement.    Baseline Met 03/10/20    Time 6    Period Weeks    Status Achieved      PT LONG TERM GOAL #4   Title Patient will demonstrate 4+/5 right knee MMT in all planes to improve stability during functional tasks.    Time 6    Period Weeks    Status On-going      PT LONG TERM GOAL #5   Title Patient will be independent with performing ADLs and home activities with right  knee pain less than or equal to  2/10.    Baseline pain 4-5/10 with ADL's 03/10/20    Time 6    Period Weeks    Status On-going                 Plan - 03/15/20 1501    Clinical Impression Statement Patient doing well.  Added leg press today without complaint.    Personal Factors and Comorbidities Age;Comorbidity 2    Comorbidities L TKA 12/16/2019, Asthma, history of lumbar surgery    Examination-Activity Limitations Locomotion Level;Transfers;Stand;Stairs;Dressing    Stability/Clinical Decision Making Stable/Uncomplicated    Rehab Potential Excellent    PT Duration 6 weeks    PT Treatment/Interventions ADLs/Self Care Home Management;Cryotherapy;Electrical Stimulation;Moist Heat;Gait training;Stair training;Functional mobility training;Therapeutic activities;Therapeutic exercise;Balance training;Neuromuscular re-education;Manual techniques;Passive range of motion;Patient/family education;Scar mobilization;Vasopneumatic Device;Taping    PT Next Visit Plan TKA protocol. progress ROM and strengthening Vasopneumatic on low.    PT Home Exercise Plan see patient education section    Consulted and Agree with Plan of Care Patient           Patient will benefit from skilled therapeutic intervention in order to improve the following deficits and impairments:  Abnormal gait, Difficulty walking, Decreased range of motion, Decreased activity tolerance, Decreased balance, Decreased strength, Increased edema, Pain  Visit Diagnosis: Chronic pain of right knee  Stiffness of right knee, not elsewhere classified  Localized edema     Problem List Patient Active Problem List   Diagnosis Date Noted  . Preoperative clearance 10/23/2019  . Family history of heart disease 10/23/2019  . Atypical chest pain 10/23/2019  . Hyperlipidemia 10/06/2019  . Smoker 10/05/2019  . Primary localized osteoarthritis of left knee 10/05/2019  . COPD, mild ([place]) 01/06/2015  . ADENOMATOUS COLONIC POLYP 02/17/2008  . GERD 02/17/2008  .  BACK PAIN, CHRONIC 02/17/2008  . B12 DEFICIENCY 12/30/2007  . WEIGHT LOSS 12/30/2007   Progress Note Reporting Period 12/26/19 to  03/15/20.    See note below for Objective Data and Assessment of Progress/Goals. Excellent progress toward goals.       Birtha Hatler, [place] MPT 03/15/2020, 3:12 PM  [facility] [address] [place], [place], [address] Phone: [phone]   Fax:  [phone]  Name: LEGEND TUMMINELLO MRN: [id number] Date of Birth: 08-21-1950

## 2020-03-17 ENCOUNTER — Ambulatory Visit: Payer: Medicare Other | Admitting: Physical Therapy

## 2020-03-17 ENCOUNTER — Encounter: Payer: Self-pay | Admitting: Physical Therapy

## 2020-03-17 ENCOUNTER — Other Ambulatory Visit: Payer: Self-pay

## 2020-03-17 DIAGNOSIS — M25562 Pain in left knee: Secondary | ICD-10-CM

## 2020-03-17 DIAGNOSIS — M25561 Pain in right knee: Secondary | ICD-10-CM | POA: Diagnosis not present

## 2020-03-17 DIAGNOSIS — G8929 Other chronic pain: Secondary | ICD-10-CM

## 2020-03-17 DIAGNOSIS — M6281 Muscle weakness (generalized): Secondary | ICD-10-CM

## 2020-03-17 DIAGNOSIS — M25662 Stiffness of left knee, not elsewhere classified: Secondary | ICD-10-CM

## 2020-03-17 DIAGNOSIS — M25661 Stiffness of right knee, not elsewhere classified: Secondary | ICD-10-CM

## 2020-03-17 DIAGNOSIS — R600 Localized edema: Secondary | ICD-10-CM

## 2020-03-17 NOTE — Therapy (Signed)
[facility] [place], [place], [address] Phone: [phone]   Fax:  [phone]  Physical Therapy Treatment  Patient Details  Name: Wesley Schultz MRN: [id number] Date of Birth: 1951/04/30 Referring Provider (PT): Dr. Noemi Chapel, MD   Encounter Date: 03/17/2020   PT End of Session - 03/17/20 1445    Visit Number 31    Number of Visits 34    Date for PT Re-Evaluation 03/25/20    Authorization Type FOTO (48% for 15th visits for R knee) ; progress note every 10th visit; KX modifier at 15th visit    Authorization Time Period visits RT knee:    2nd    PT Start Time 1430    PT Stop Time 1518    PT Time Calculation (min) 48 min    Activity Tolerance Patient tolerated treatment well    Behavior During Therapy [facility] for tasks assessed/performed           Past Medical History:  Diagnosis Date  . Arthritis   . Asthma   . Colon polyps   . GERD (gastroesophageal reflux disease)   . Primary localized osteoarthritis of left knee 10/05/2019    Past Surgical History:  Procedure Laterality Date  . BACK SURGERY     2008, 2009, 2010  . CATARACT EXTRACTION, BILATERAL    . CERVICAL SPINE SURGERY    . HERNIA REPAIR    . LUMBAR LAMINECTOMY      There were no vitals filed for this visit.   Subjective Assessment - 03/17/20 1444    Subjective COVID-19 screen performed prior to patient entering clinic. Patient reports he nearly fell getting up to use the bathroom. Patient reported he did not fall to the ground.    Pertinent History L TKA 12/16/2019, Asthma, history of lumbar surgery, left TKA.    Limitations Sitting;Standing;Walking;House hold activities    How long can you stand comfortably? short periods    How long can you walk comfortably? around home    Patient Stated Goals walk without AD    Currently in Pain? Yes   did not provide number on pain scale             [facility] PT Assessment - 03/17/20 0001      Assessment   Medical  Diagnosis S/P left total knee replacement    Referring Provider (PT) Dr. Noemi Chapel, MD    Onset Date/Surgical Date 12/16/19    Next MD Visit 03/28/2020    Prior Therapy no      Precautions   Precautions Other (comment)    Precaution Comments no ultrasound      Restrictions   Weight Bearing Restrictions No                         [facility] Adult PT Treatment/Exercise - 03/17/20 0001      Exercises   Exercises Knee/Hip      Knee/Hip Exercises: Aerobic   Recumbent Bike Level 2 x 15 minutes.      Knee/Hip Exercises: Machines for Strengthening   Cybex Knee Extension 10# x 3 minutes.    Cybex Knee Flexion 40# x 3 minutes.    Cybex Leg Press 2 plates x 3 minutes.      Knee/Hip Exercises: Standing   Rocker Board 3 minutes      Vasopneumatic   Number Minutes Vasopneumatic  15 minutes    Vasopnuematic Location  Knee    Vasopneumatic Pressure Medium  Vasopneumatic Temperature  34 for edema                    PT Short Term Goals - 03/10/20 1437      PT SHORT TERM GOAL #1   Title Patient will ambulate with SPC with minimal gait devations and right knee pain less than or equal to 2/10.    Time 2    Period Weeks    Status Achieved      PT SHORT TERM GOAL #2   Title Patient will demonstrate 2-90 degrees of right  knee AROM to improve ability to perform functional tasks.    Baseline AROM-6-105 degrees 03/10/20    Time 2    Period Weeks    Status Partially Met             PT Long Term Goals - 03/10/20 1438      PT LONG TERM GOAL #1   Title Patient will be independent with advanced HEP    Time 6    Period Weeks    Status On-going      PT LONG TERM GOAL #2   Title Patient will demonstrate 0-115+ degrees of right  knee AROM to improve ability to perform ADLs and functional tasks.    Baseline AROM -6-105 degrees 03/10/20    Time 6    Period Weeks    Status On-going      PT LONG TERM GOAL #3   Title Patient will demonstrate reciprocating stair  ambulation with one rail to safely acess basement.    Baseline Met 03/10/20    Time 6    Period Weeks    Status Achieved      PT LONG TERM GOAL #4   Title Patient will demonstrate 4+/5 right knee MMT in all planes to improve stability during functional tasks.    Time 6    Period Weeks    Status On-going      PT LONG TERM GOAL #5   Title Patient will be independent with performing ADLs and home activities with right  knee pain less than or equal to 2/10.    Baseline pain 4-5/10 with ADL's 03/10/20    Time 6    Period Weeks    Status On-going                 Plan - 03/17/20 1704    Clinical Impression Statement Patient responded well to strengthening TEs today. Patient educated on 90-90 degree for leg press for strengthening in that range. Patient reported understanding. Patient to see MD on 03/28/2020 for follow up visit. Patient's FOTO limitation 43%. Normal response to modalities upon removal.    Personal Factors and Comorbidities Age;Comorbidity 2    Comorbidities L TKA 12/16/2019, Asthma, history of lumbar surgery    Examination-Activity Limitations Locomotion Level;Transfers;Stand;Stairs;Dressing    Stability/Clinical Decision Making Stable/Uncomplicated    Clinical Decision Making Low    Rehab Potential Excellent    PT Duration 6 weeks    PT Treatment/Interventions ADLs/Self Care Home Management;Cryotherapy;Electrical Stimulation;Moist Heat;Gait training;Stair training;Functional mobility training;Therapeutic activities;Therapeutic exercise;Balance training;Neuromuscular re-education;Manual techniques;Passive range of motion;Patient/family education;Scar mobilization;Vasopneumatic Device;Taping    PT Next Visit Plan TKA protocol. progress ROM and strengthening Vasopneumatic on medium. (on 34th visit, FOTO visit number should be 18 due to assessing just R knee)    PT Home Exercise Plan see patient education section    Consulted and Agree with Plan of Care Patient  Patient will benefit from skilled therapeutic intervention in order to improve the following deficits and impairments:  Abnormal gait, Difficulty walking, Decreased range of motion, Decreased activity tolerance, Decreased balance, Decreased strength, Increased edema, Pain  Visit Diagnosis: Chronic pain of right knee  Stiffness of right knee, not elsewhere classified  Localized edema  Muscle weakness (generalized)  Acute pain of left knee  Stiffness of left knee, not elsewhere classified     Problem List Patient Active Problem List   Diagnosis Date Noted  . Preoperative clearance 10/23/2019  . Family history of heart disease 10/23/2019  . Atypical chest pain 10/23/2019  . Hyperlipidemia 10/06/2019  . Smoker 10/05/2019  . Primary localized osteoarthritis of left knee 10/05/2019  . COPD, mild ([place]) 01/06/2015  . ADENOMATOUS COLONIC POLYP 02/17/2008  . GERD 02/17/2008  . BACK PAIN, CHRONIC 02/17/2008  . B12 DEFICIENCY 12/30/2007  . WEIGHT LOSS 12/30/2007    Gabriela Eves, PT, DPT 03/17/2020, 5:07 PM  [facility] [place], [place], [address] Phone: [phone]   Fax:  [phone]  Name: Wesley Schultz MRN: [id number] Date of Birth: Oct 17, 1950

## 2020-03-22 ENCOUNTER — Other Ambulatory Visit: Payer: Self-pay

## 2020-03-22 ENCOUNTER — Ambulatory Visit: Payer: Medicare Other | Admitting: *Deleted

## 2020-03-22 DIAGNOSIS — M25561 Pain in right knee: Secondary | ICD-10-CM | POA: Diagnosis not present

## 2020-03-22 DIAGNOSIS — M6281 Muscle weakness (generalized): Secondary | ICD-10-CM

## 2020-03-22 DIAGNOSIS — R600 Localized edema: Secondary | ICD-10-CM

## 2020-03-22 DIAGNOSIS — M25661 Stiffness of right knee, not elsewhere classified: Secondary | ICD-10-CM

## 2020-03-22 DIAGNOSIS — G8929 Other chronic pain: Secondary | ICD-10-CM

## 2020-03-22 NOTE — Therapy (Signed)
[facility] [place], [place], [address] Phone: [phone]   Fax:  [phone]  Physical Therapy Treatment  Patient Details  Name: Wesley Schultz MRN: [id number] Date of Birth: 03/15/1951 Referring Provider (PT): Dr. Noemi Chapel, MD   Encounter Date: 03/22/2020   PT End of Session - 03/22/20 1521    Visit Number 32    Number of Visits 34    Date for PT Re-Evaluation 03/25/20    Authorization Type FOTO (48% for 15th visits for R knee) ; progress note every 10th visit; KX modifier at 15th visit    PT Start Time 1430    PT Stop Time 1515    PT Time Calculation (min) 45 min           Past Medical History:  Diagnosis Date  . Arthritis   . Asthma   . Colon polyps   . GERD (gastroesophageal reflux disease)   . Primary localized osteoarthritis of left knee 10/05/2019    Past Surgical History:  Procedure Laterality Date  . BACK SURGERY     2008, 2009, 2010  . CATARACT EXTRACTION, BILATERAL    . CERVICAL SPINE SURGERY    . HERNIA REPAIR    . LUMBAR LAMINECTOMY      There were no vitals filed for this visit.   Subjective Assessment - 03/22/20 1435    Subjective COVID-19 screen performed prior to patient entering clinic. To MD 03-28-20    Pertinent History L TKA 12/16/2019, Asthma, history of lumbar surgery, left TKA.    Limitations Sitting;Standing;Walking;House hold activities    How long can you stand comfortably? short periods    How long can you walk comfortably? around home    Patient Stated Goals walk without AD    Currently in Pain? Yes    Pain Score 4     Pain Location Knee    Pain Orientation Right    Pain Descriptors / Indicators Discomfort    Pain Onset More than a month ago                             [facility] Adult PT Treatment/Exercise - 03/22/20 0001      Exercises   Exercises Knee/Hip      Knee/Hip Exercises: Aerobic   Recumbent Bike Level 2 x 15 minutes.      Knee/Hip  Exercises: Machines for Strengthening   Cybex Knee Extension 10# x 3 minutes.    Cybex Knee Flexion 40# x 3 minutes.    Cybex Leg Press 2.5  plates x 4 minutes.      Knee/Hip Exercises: Standing   Forward Step Up Right;10 reps;Hand Hold: 1;Step Height: 6";2 sets    Step Down Right;10 reps;Hand Hold: 1;Step Height: 6";2 sets    Rocker Board 4 minutes      Manual Therapy   Manual Therapy Passive ROM    Manual therapy comments PROM flexion to 115 degrees    Passive ROM Manual PROM for right knee flexion and ext with holds to improve mobility.                    PT Short Term Goals - 03/10/20 1437      PT SHORT TERM GOAL #1   Title Patient will ambulate with SPC with minimal gait devations and right knee pain less than or equal to 2/10.    Time 2    Period Weeks  Status Achieved      PT SHORT TERM GOAL #2   Title Patient will demonstrate 2-90 degrees of right  knee AROM to improve ability to perform functional tasks.    Baseline AROM-6-105 degrees 03/10/20    Time 2    Period Weeks    Status Partially Met             PT Long Term Goals - 03/10/20 1438      PT LONG TERM GOAL #1   Title Patient will be independent with advanced HEP    Time 6    Period Weeks    Status On-going      PT LONG TERM GOAL #2   Title Patient will demonstrate 0-115+ degrees of right  knee AROM to improve ability to perform ADLs and functional tasks.    Baseline AROM -6-105 degrees 03/10/20    Time 6    Period Weeks    Status On-going      PT LONG TERM GOAL #3   Title Patient will demonstrate reciprocating stair ambulation with one rail to safely acess basement.    Baseline Met 03/10/20    Time 6    Period Weeks    Status Achieved      PT LONG TERM GOAL #4   Title Patient will demonstrate 4+/5 right knee MMT in all planes to improve stability during functional tasks.    Time 6    Period Weeks    Status On-going      PT LONG TERM GOAL #5   Title Patient will be independent  with performing ADLs and home activities with right  knee pain less than or equal to 2/10.    Baseline pain 4-5/10 with ADL's 03/10/20    Time 6    Period Weeks    Status On-going                 Plan - 03/22/20 1528    Clinical Impression Statement Pt arrived today doing fairly well with RT knee and was able to complete CKC as well as [facility] strengthening. PROM for flexion today was 115 degrees and mid-patella circumferance was 40.5 cms. No Vaso today as per Pt    Personal Factors and Comorbidities Age;Comorbidity 2    Comorbidities L TKA 12/16/2019, Asthma, history of lumbar surgery    Examination-Activity Limitations Locomotion Level;Transfers;Stand;Stairs;Dressing    Stability/Clinical Decision Making Stable/Uncomplicated           Patient will benefit from skilled therapeutic intervention in order to improve the following deficits and impairments:  Abnormal gait, Difficulty walking, Decreased range of motion, Decreased activity tolerance, Decreased balance, Decreased strength, Increased edema, Pain  Visit Diagnosis: Chronic pain of right knee  Stiffness of right knee, not elsewhere classified  Localized edema  Muscle weakness (generalized)     Problem List Patient Active Problem List   Diagnosis Date Noted  . Preoperative clearance 10/23/2019  . Family history of heart disease 10/23/2019  . Atypical chest pain 10/23/2019  . Hyperlipidemia 10/06/2019  . Smoker 10/05/2019  . Primary localized osteoarthritis of left knee 10/05/2019  . COPD, mild ([place]) 01/06/2015  . ADENOMATOUS COLONIC POLYP 02/17/2008  . GERD 02/17/2008  . BACK PAIN, CHRONIC 02/17/2008  . B12 DEFICIENCY 12/30/2007  . WEIGHT LOSS 12/30/2007    Marjorie Lussier,CHRIS, PTA 03/22/2020, 3:39 PM  [facility] [place], [place], [address] Phone: [phone]   Fax:  [phone]  Name: RHET RORKE MRN: [id number]  Date of Birth: 03-20-1951

## 2020-03-29 ENCOUNTER — Ambulatory Visit: Payer: Medicare Other | Admitting: Physical Therapy

## 2020-03-29 ENCOUNTER — Other Ambulatory Visit: Payer: Self-pay

## 2020-03-29 DIAGNOSIS — G8929 Other chronic pain: Secondary | ICD-10-CM

## 2020-03-29 DIAGNOSIS — R600 Localized edema: Secondary | ICD-10-CM

## 2020-03-29 DIAGNOSIS — M25561 Pain in right knee: Secondary | ICD-10-CM

## 2020-03-29 DIAGNOSIS — M25661 Stiffness of right knee, not elsewhere classified: Secondary | ICD-10-CM

## 2020-03-29 NOTE — Therapy (Signed)
[facility] [place], [place], [address] Phone: [phone]   Fax:  [phone]  Physical Therapy Treatment  Patient Details  Name: Wesley Schultz MRN: [id number] Date of Birth: [birth year] Referring Provider (PT): Dr. Noemi Chapel, MD   Encounter Date: 03/29/2020   PT End of Session - 03/29/20 1452    Visit Number 33    Number of Visits 34    Date for PT Re-Evaluation 04/01/20    PT Start Time 0230    PT Stop Time 0315    PT Time Calculation (min) 45 min    Activity Tolerance Patient tolerated treatment well    Behavior During Therapy [facility] for tasks assessed/performed           Past Medical History:  Diagnosis Date  . Arthritis   . Asthma   . Colon polyps   . GERD (gastroesophageal reflux disease)   . Primary localized osteoarthritis of left knee 10/05/2019    Past Surgical History:  Procedure Laterality Date  . BACK SURGERY     2008, 2009, 2010  . CATARACT EXTRACTION, BILATERAL    . CERVICAL SPINE SURGERY    . HERNIA REPAIR    . LUMBAR LAMINECTOMY      There were no vitals filed for this visit.   Subjective Assessment - 03/29/20 1512    Subjective COVID-19 screen performed prior to patient entering clinic.  Went to doctor and he said it looks good, just gonna take time.    Pertinent History L TKA 12/16/2019, Asthma, history of lumbar surgery, left TKA.    Limitations Sitting;Standing;Walking;House hold activities    How long can you stand comfortably? short periods    How long can you walk comfortably? around home                             [facility] Adult PT Treatment/Exercise - 03/29/20 0001      Exercises   Exercises Knee/Hip      Knee/Hip Exercises: Aerobic   Recumbent Bike Level 2 x 15 minutes.      Knee/Hip Exercises: Machines for Strengthening   Cybex Knee Extension 10# x 4 minutes.    Cybex Knee Flexion 40# x 4 minutes.      Modalities   Modalities Vasopneumatic       Vasopneumatic   Number Minutes Vasopneumatic  20 minutes    Vasopnuematic Location  --   RT knee.   Vasopneumatic Pressure Medium                    PT Short Term Goals - 03/10/20 1437      PT SHORT TERM GOAL #1   Title Patient will ambulate with SPC with minimal gait devations and right knee pain less than or equal to 2/10.    Time 2    Period Weeks    Status Achieved      PT SHORT TERM GOAL #2   Title Patient will demonstrate 2-90 degrees of right  knee AROM to improve ability to perform functional tasks.    Baseline AROM-6-105 degrees 03/10/20    Time 2    Period Weeks    Status Partially Met             PT Long Term Goals - 03/10/20 1438      PT LONG TERM GOAL #1   Title Patient will be independent with advanced HEP  Time 6    Period Weeks    Status On-going      PT LONG TERM GOAL #2   Title Patient will demonstrate 0-115+ degrees of right  knee AROM to improve ability to perform ADLs and functional tasks.    Baseline AROM -6-105 degrees 03/10/20    Time 6    Period Weeks    Status On-going      PT LONG TERM GOAL #3   Title Patient will demonstrate reciprocating stair ambulation with one rail to safely acess basement.    Baseline Met 03/10/20    Time 6    Period Weeks    Status Achieved      PT LONG TERM GOAL #4   Title Patient will demonstrate 4+/5 right knee MMT in all planes to improve stability during functional tasks.    Time 6    Period Weeks    Status On-going      PT LONG TERM GOAL #5   Title Patient will be independent with performing ADLs and home activities with right  knee pain less than or equal to 2/10.    Baseline pain 4-5/10 with ADL's 03/10/20    Time 6    Period Weeks    Status On-going                 Plan - 03/29/20 1537    Clinical Impression Statement Patient overall has done an excellent job.  He has one remaining visit.           Patient will benefit from skilled therapeutic intervention in order to  improve the following deficits and impairments:     Visit Diagnosis: Chronic pain of right knee  Stiffness of right knee, not elsewhere classified  Localized edema     Problem List Patient Active Problem List   Diagnosis Date Noted  . Preoperative clearance 10/23/2019  . Family history of heart disease 10/23/2019  . Atypical chest pain 10/23/2019  . Hyperlipidemia 10/06/2019  . Smoker 10/05/2019  . Primary localized osteoarthritis of left knee 10/05/2019  . COPD, mild ([place]) 01/06/2015  . ADENOMATOUS COLONIC POLYP 02/17/2008  . GERD 02/17/2008  . BACK PAIN, CHRONIC 02/17/2008  . B12 DEFICIENCY 12/30/2007  . WEIGHT LOSS 12/30/2007    Sahej Hauswirth, [place] MPT 03/29/2020, 3:48 PM  [facility] [place], [place], [address] Phone: [phone]   Fax:  [phone]  Name: NOLAN TUAZON MRN: [id number] Date of Birth: 02-Nov-1950

## 2020-04-05 ENCOUNTER — Other Ambulatory Visit: Payer: Self-pay

## 2020-04-05 ENCOUNTER — Ambulatory Visit: Payer: Medicare Other | Attending: Physician Assistant | Admitting: Physical Therapy

## 2020-04-05 DIAGNOSIS — M25661 Stiffness of right knee, not elsewhere classified: Secondary | ICD-10-CM | POA: Insufficient documentation

## 2020-04-05 DIAGNOSIS — R600 Localized edema: Secondary | ICD-10-CM | POA: Insufficient documentation

## 2020-04-05 DIAGNOSIS — G8929 Other chronic pain: Secondary | ICD-10-CM | POA: Insufficient documentation

## 2020-04-05 DIAGNOSIS — M25561 Pain in right knee: Secondary | ICD-10-CM | POA: Insufficient documentation

## 2020-04-05 NOTE — Therapy (Signed)
[facility] [place], [place], [address] Phone: [phone]   Fax:  [phone]  Physical Therapy Treatment  Patient Details  Name: Wesley Schultz MRN: [id number] Date of Birth: 16-Oct-1950 Referring Provider (PT): Dr. Noemi Chapel, MD   Encounter Date: 04/05/2020   PT End of Session - 04/05/20 1544    Visit Number 34    Number of Visits 34    Date for PT Re-Evaluation 04/05/20    PT Start Time 0232    PT Stop Time 0320    PT Time Calculation (min) 48 min    Activity Tolerance Patient tolerated treatment well    Behavior During Therapy [facility] for tasks assessed/performed           Past Medical History:  Diagnosis Date  . Arthritis   . Asthma   . Colon polyps   . GERD (gastroesophageal reflux disease)   . Primary localized osteoarthritis of left knee 10/05/2019    Past Surgical History:  Procedure Laterality Date  . BACK SURGERY     2008, 2009, 2010  . CATARACT EXTRACTION, BILATERAL    . CERVICAL SPINE SURGERY    . HERNIA REPAIR    . LUMBAR LAMINECTOMY      There were no vitals filed for this visit.   Subjective Assessment - 04/05/20 1537    Subjective COVID-19 screen performed prior to patient entering clinic.  Last day.    Pertinent History L TKA 12/16/2019, Asthma, history of lumbar surgery, left TKA.    Limitations Sitting;Standing;Walking;House hold activities    Currently in Pain? Yes    Pain Score 4     Pain Location Knee    Pain Orientation Right    Pain Descriptors / Indicators Discomfort    Pain Type Surgical pain    Pain Onset More than a month ago              [facility] PT Assessment - 04/05/20 0001      AROM   Right Knee Flexion 115                         [facility] Adult PT Treatment/Exercise - 04/05/20 0001      Exercises   Exercises Knee/Hip      Knee/Hip Exercises: Aerobic   Recumbent Bike Level 2 x 15 minutes.      Knee/Hip Exercises: Machines for Strengthening   Cybex  Knee Extension 10# x 3 minutes.    Cybex Knee Flexion 40# x 3 minutes.    Cybex Leg Press 21/2 plates x 3 minutes.      Modalities   Modalities Vasopneumatic      Vasopneumatic   Number Minutes Vasopneumatic  20 minutes    Vasopnuematic Location  --   Right knee.   Vasopneumatic Pressure Medium                    PT Short Term Goals - 03/10/20 1437      PT SHORT TERM GOAL #1   Title Patient will ambulate with SPC with minimal gait devations and right knee pain less than or equal to 2/10.    Time 2    Period Weeks    Status Achieved      PT SHORT TERM GOAL #2   Title Patient will demonstrate 2-90 degrees of right  knee AROM to improve ability to perform functional tasks.    Baseline AROM-6-105 degrees 03/10/20  Time 2    Period Weeks    Status Partially Met             PT Long Term Goals - 04/05/20 1542      PT LONG TERM GOAL #1   Title Patient will be independent with advanced HEP    Time 6    Period Weeks    Status Achieved      PT LONG TERM GOAL #2   Title Patient will demonstrate 0-115+ degrees of right  knee AROM to improve ability to perform ADLs and functional tasks.    Baseline -5 to 115 degrees.    Time 6    Period Weeks    Status Achieved      PT LONG TERM GOAL #3   Title Patient will demonstrate reciprocating stair ambulation with one rail to safely acess basement.    Time 6    Period Weeks    Status Achieved      PT LONG TERM GOAL #4   Title Patient will demonstrate 4+/5 right knee MMT in all planes to improve stability during functional tasks.    Time 6    Period Weeks    Status Achieved      PT LONG TERM GOAL #5   Title Patient will be independent with performing ADLs and home activities with right  knee pain less than or equal to 2/10.    Baseline Pain at 4/10.    Time 6    Period Weeks    Status Partially Met                  Patient will benefit from skilled therapeutic intervention in order to improve the  following deficits and impairments:     Visit Diagnosis: Chronic pain of right knee  Stiffness of right knee, not elsewhere classified  Localized edema     Problem List Patient Active Problem List   Diagnosis Date Noted  . Preoperative clearance 10/23/2019  . Family history of heart disease 10/23/2019  . Atypical chest pain 10/23/2019  . Hyperlipidemia 10/06/2019  . Smoker 10/05/2019  . Primary localized osteoarthritis of left knee 10/05/2019  . COPD, mild ([place]) 01/06/2015  . ADENOMATOUS COLONIC POLYP 02/17/2008  . GERD 02/17/2008  . BACK PAIN, CHRONIC 02/17/2008  . B12 DEFICIENCY 12/30/2007  . WEIGHT LOSS 12/30/2007    PHYSICAL THERAPY DISCHARGE SUMMARY  Visits from Start of Care:   Current functional level related to goals / functional outcomes: See above.   Remaining deficits: See goal section.   Education / Equipment: HEP. Plan: Patient agrees to discharge.  Patient goals were partially met. Patient is being discharged due to meeting the stated rehab goals.  ?????          Sindee Stucker, [place] MPT 04/05/2020, 3:47 PM  [facility] [address] [place], [place], [address] Phone: [phone]   Fax:  [phone]  Name: TAEVEON KEESLING MRN: [id number] Date of Birth: 28-Oct-1950

## 2020-05-24 DIAGNOSIS — M170 Bilateral primary osteoarthritis of knee: Secondary | ICD-10-CM | POA: Diagnosis not present

## 2020-05-24 DIAGNOSIS — M25571 Pain in right ankle and joints of right foot: Secondary | ICD-10-CM | POA: Diagnosis not present

## 2020-06-20 DIAGNOSIS — R059 Cough, unspecified: Secondary | ICD-10-CM | POA: Diagnosis not present

## 2020-06-20 DIAGNOSIS — J029 Acute pharyngitis, unspecified: Secondary | ICD-10-CM | POA: Diagnosis not present

## 2020-06-20 DIAGNOSIS — R6889 Other general symptoms and signs: Secondary | ICD-10-CM | POA: Diagnosis not present

## 2020-06-20 DIAGNOSIS — J329 Chronic sinusitis, unspecified: Secondary | ICD-10-CM | POA: Diagnosis not present

## 2020-07-11 ENCOUNTER — Other Ambulatory Visit: Payer: Self-pay

## 2020-07-11 ENCOUNTER — Ambulatory Visit (INDEPENDENT_AMBULATORY_CARE_PROVIDER_SITE_OTHER): Payer: Medicare Other

## 2020-07-11 ENCOUNTER — Ambulatory Visit (INDEPENDENT_AMBULATORY_CARE_PROVIDER_SITE_OTHER): Payer: Medicare Other | Admitting: Nurse Practitioner

## 2020-07-11 ENCOUNTER — Encounter: Payer: Self-pay | Admitting: Nurse Practitioner

## 2020-07-11 VITALS — BP 125/71 | HR 70 | Temp 98.6°F | Resp 20 | Ht 72.0 in | Wt 179.0 lb

## 2020-07-11 DIAGNOSIS — S93402A Sprain of unspecified ligament of left ankle, initial encounter: Secondary | ICD-10-CM

## 2020-07-11 DIAGNOSIS — M19072 Primary osteoarthritis, left ankle and foot: Secondary | ICD-10-CM | POA: Diagnosis not present

## 2020-07-11 DIAGNOSIS — M25572 Pain in left ankle and joints of left foot: Secondary | ICD-10-CM | POA: Diagnosis not present

## 2020-07-11 NOTE — Patient Instructions (Signed)
Ankle Pain The ankle joint holds your body weight and allows you to move around. Ankle pain can occur on either side or the back of one ankle or both ankles. Ankle pain may be sharp and burning or dull and aching. There may be tenderness, stiffness, redness, or warmth around the ankle. Many things can cause ankle pain, including an injury to the area and overuse of the ankle. Follow these instructions at home: Activity  Rest your ankle as told by your health care provider. Avoid any activities that cause ankle pain.  Do not use the injured limb to support your body weight until your health care provider says that you can. Use crutches as told by your health care provider.  Do exercises as told by your health care provider.  Ask your health care provider when it is safe to drive if you have a brace on your ankle. If you have a brace:  Wear the brace as told by your health care provider. Remove it only as told by your health care provider.  Loosen the brace if your toes tingle, become numb, or turn cold and blue.  Keep the brace clean.  If the brace is not waterproof: ? Do not let it get wet. ? Cover it with a watertight covering when you take a bath or shower. If you were given an elastic bandage:  Remove it when you take a bath or a shower.  Try not to move your ankle very much, but wiggle your toes from time to time. This helps to prevent swelling.  Adjust the bandage to make it more comfortable if it feels too tight.  Loosen the bandage if you have numbness or tingling in your foot or if your foot turns cold and blue.   Managing pain, stiffness, and swelling  If directed, put ice on the painful area. ? If you have a removable brace or elastic bandage, remove it as told by your health care provider. ? Put ice in a plastic bag. ? Place a towel between your skin and the bag. ? Leave the ice on for 20 minutes, 2-3 times a day.  Move your toes often to avoid stiffness and to  lessen swelling.  Raise (elevate) your ankle above the level of your heart while you are sitting or lying down.   General instructions  Record information about your pain. Writing down the following may be helpful for you and your health care provider: ? How often you have ankle pain. ? Where the pain is located. ? What the pain feels like.  If treatment involves wearing a prescribed shoe or insole, make sure you wear it correctly and for as long as told by your health care provider.  Take over-the-counter and prescription medicines only as told by your health care provider.  Keep all follow-up visits as told by your health care provider. This is important. Contact a health care provider if:  Your pain gets worse.  Your pain is not relieved with medicines.  You have a fever or chills.  You are having more trouble with walking.  You have new symptoms. Get help right away if:  Your foot, leg, toes, or ankle: ? Tingles or becomes numb. ? Becomes swollen. ? Turns pale or blue. Summary  Ankle pain can occur on either side or the back of one ankle or both ankles.  Ankle pain may be sharp and burning or dull and aching.  Rest your ankle as told by your health  care provider. If told, apply ice to the area.  Take over-the-counter and prescription medicines only as told by your health care provider. This information is not intended to replace advice given to you by your health care provider. Make sure you discuss any questions you have with your health care provider. Document Revised: 08/05/2018 Document Reviewed: 10/23/2017 Elsevier Patient Education  [place].

## 2020-07-11 NOTE — Progress Notes (Signed)
   Subjective:    Patient ID: Wesley Schultz, male    DOB: October 13, 1950, 70 y.o.   MRN: [id number]   Chief Complaint: Ankle Pain (No injury/)   HPI Patient comes in today c/o left ankle pain. Says feels like someone hit it with a hammer. He has been taking advil. Denies injury. Able to walk on but feels stiff. Rates the pain 5/10. Walking increases pain. Advil helps pain.   Review of Systems  Musculoskeletal: Positive for arthralgias (left ankle pain).  All other systems reviewed and are negative.      Objective:   Physical Exam Musculoskeletal:     Comments: Pain on palpation lateral side of left ankle with mild edema Pain on inversion and eversion of foot. Slight pan with extension.    Ankle xray- no fracture visible       Assessment & Plan:  Wesley Schultz in today with chief complaint of Ankle Pain (No injury/)   1. Acute left ankle pain  - DG Ankle Complete Left  2. Sprain of left ankle, unspecified ligament, initial encounter Ace wrap Elevate Ice BID  continueadvl as needed ( creatine was .88) RTO prn    The above assessment and management plan was discussed with the patient. The patient verbalized understanding of and has agreed to the management plan. Patient is aware to call the clinic if symptoms persist or worsen. Patient is aware when to return to the clinic for a follow-up visit. Patient educated on when it is appropriate to go to the emergency department.   Mary-Margaret Hassell Done, FNP

## 2020-07-18 ENCOUNTER — Other Ambulatory Visit (HOSPITAL_COMMUNITY): Payer: Self-pay | Admitting: Orthopedic Surgery

## 2020-07-18 ENCOUNTER — Other Ambulatory Visit: Payer: Self-pay

## 2020-07-18 ENCOUNTER — Ambulatory Visit (HOSPITAL_COMMUNITY)
Admission: RE | Admit: 2020-07-18 | Discharge: 2020-07-18 | Disposition: A | Discharge status: Home / Self Care | Payer: Medicare Other | Source: Ambulatory Visit | Attending: Orthopedic Surgery | Admitting: Orthopedic Surgery

## 2020-07-18 DIAGNOSIS — L03116 Cellulitis of left lower limb: Secondary | ICD-10-CM | POA: Diagnosis not present

## 2020-07-18 DIAGNOSIS — M79605 Pain in left leg: Secondary | ICD-10-CM

## 2020-07-25 ENCOUNTER — Encounter: Payer: Self-pay | Admitting: Medical

## 2020-07-25 ENCOUNTER — Emergency Department (HOSPITAL_COMMUNITY): Payer: Medicare Other

## 2020-07-25 ENCOUNTER — Other Ambulatory Visit: Payer: Self-pay

## 2020-07-25 ENCOUNTER — Inpatient Hospital Stay (HOSPITAL_COMMUNITY)
Admission: EM | Admit: 2020-07-25 | Discharge: 2020-07-26 | DRG: 282 | Disposition: A | Discharge status: Home / Self Care | Payer: Medicare Other | Attending: Cardiology | Admitting: Cardiology

## 2020-07-25 ENCOUNTER — Ambulatory Visit (INDEPENDENT_AMBULATORY_CARE_PROVIDER_SITE_OTHER): Payer: Medicare Other | Admitting: Medical

## 2020-07-25 ENCOUNTER — Telehealth: Payer: Self-pay | Admitting: Cardiovascular Disease

## 2020-07-25 ENCOUNTER — Encounter (HOSPITAL_COMMUNITY): Payer: Self-pay | Admitting: Emergency Medicine

## 2020-07-25 ENCOUNTER — Telehealth: Payer: Self-pay | Admitting: Physician Assistant

## 2020-07-25 VITALS — BP 130/60 | HR 63 | Ht 72.0 in | Wt 177.0 lb

## 2020-07-25 DIAGNOSIS — Z800 Family history of malignant neoplasm of digestive organs: Secondary | ICD-10-CM | POA: Diagnosis not present

## 2020-07-25 DIAGNOSIS — Z01818 Encounter for other preprocedural examination: Secondary | ICD-10-CM | POA: Diagnosis not present

## 2020-07-25 DIAGNOSIS — R079 Chest pain, unspecified: Secondary | ICD-10-CM | POA: Diagnosis not present

## 2020-07-25 DIAGNOSIS — E785 Hyperlipidemia, unspecified: Secondary | ICD-10-CM | POA: Diagnosis not present

## 2020-07-25 DIAGNOSIS — F17210 Nicotine dependence, cigarettes, uncomplicated: Secondary | ICD-10-CM | POA: Diagnosis present

## 2020-07-25 DIAGNOSIS — Z79899 Other long term (current) drug therapy: Secondary | ICD-10-CM | POA: Diagnosis not present

## 2020-07-25 DIAGNOSIS — R030 Elevated blood-pressure reading, without diagnosis of hypertension: Secondary | ICD-10-CM | POA: Diagnosis not present

## 2020-07-25 DIAGNOSIS — I214 Non-ST elevation (NSTEMI) myocardial infarction: Principal | ICD-10-CM | POA: Diagnosis present

## 2020-07-25 DIAGNOSIS — Z8719 Personal history of other diseases of the digestive system: Secondary | ICD-10-CM | POA: Diagnosis not present

## 2020-07-25 DIAGNOSIS — Z7982 Long term (current) use of aspirin: Secondary | ICD-10-CM | POA: Diagnosis not present

## 2020-07-25 DIAGNOSIS — Z720 Tobacco use: Secondary | ICD-10-CM

## 2020-07-25 DIAGNOSIS — Z20822 Contact with and (suspected) exposure to covid-19: Secondary | ICD-10-CM | POA: Diagnosis present

## 2020-07-25 DIAGNOSIS — R7989 Other specified abnormal findings of blood chemistry: Secondary | ICD-10-CM

## 2020-07-25 DIAGNOSIS — I25119 Atherosclerotic heart disease of native coronary artery with unspecified angina pectoris: Secondary | ICD-10-CM | POA: Diagnosis present

## 2020-07-25 DIAGNOSIS — J449 Chronic obstructive pulmonary disease, unspecified: Secondary | ICD-10-CM | POA: Diagnosis not present

## 2020-07-25 DIAGNOSIS — F17200 Nicotine dependence, unspecified, uncomplicated: Secondary | ICD-10-CM | POA: Diagnosis present

## 2020-07-25 DIAGNOSIS — M1712 Unilateral primary osteoarthritis, left knee: Secondary | ICD-10-CM | POA: Diagnosis present

## 2020-07-25 DIAGNOSIS — Z833 Family history of diabetes mellitus: Secondary | ICD-10-CM

## 2020-07-25 DIAGNOSIS — Z8249 Family history of ischemic heart disease and other diseases of the circulatory system: Secondary | ICD-10-CM

## 2020-07-25 DIAGNOSIS — L03116 Cellulitis of left lower limb: Secondary | ICD-10-CM | POA: Diagnosis not present

## 2020-07-25 DIAGNOSIS — R778 Other specified abnormalities of plasma proteins: Secondary | ICD-10-CM

## 2020-07-25 LAB — BASIC METABOLIC PANEL
Anion gap: 7 (ref 5–15)
BUN: 25 mg/dL — ABNORMAL HIGH (ref 8–23)
CO2: 29 mmol/L (ref 22–32)
Calcium: 9 mg/dL (ref 8.9–10.3)
Chloride: 103 mmol/L (ref 98–111)
Creatinine, Ser: 1.19 mg/dL (ref 0.61–1.24)
GFR, Estimated: >60 mL/min (ref >60)
Glucose, Bld: 109 mg/dL — ABNORMAL HIGH (ref 70–99)
Potassium: 4.3 mmol/L (ref 3.5–5.1)
Sodium: 139 mmol/L (ref 135–145)

## 2020-07-25 LAB — TROPONIN T: Troponin T (Highly Sensitive): 79 ng/L (ref 0–22)

## 2020-07-25 LAB — CBC
HCT: 50 % (ref 39.0–52.0)
Hemoglobin: 16.1 g/dL (ref 13.0–17.0)
MCH: 29.3 pg (ref 26.0–34.0)
MCHC: 32.2 g/dL (ref 30.0–36.0)
MCV: 90.9 fL (ref 80.0–100.0)
Platelets: 280 10*3/uL (ref 150–400)
RBC: 5.5 MIL/uL (ref 4.22–5.81)
RDW: 13.5 % (ref 11.5–15.5)
WBC: 12.5 10*3/uL — ABNORMAL HIGH (ref 4.0–10.5)
nRBC: 0 % (ref 0.0–0.2)

## 2020-07-25 MED ORDER — HEPARIN BOLUS VIA INFUSION
4000.0000 [IU] | Freq: Once | INTRAVENOUS | Status: AC
  Administered 2020-07-26: 4000 [IU] via INTRAVENOUS
  Filled 2020-07-25: qty 4000

## 2020-07-25 MED ORDER — NITROGLYCERIN 0.4 MG SL SUBL: 0.4000 mg | SUBLINGUAL_TABLET | SUBLINGUAL | 3 refills | Status: DC | PRN

## 2020-07-25 MED ORDER — ASPIRIN 81 MG PO CHEW
324.0000 mg | CHEWABLE_TABLET | Freq: Once | ORAL | Status: AC
  Administered 2020-07-25: 324 mg via ORAL
  Filled 2020-07-25: qty 4

## 2020-07-25 MED ORDER — HEPARIN (PORCINE) 25000 UT/250ML-% IV SOLN
1100.0000 [IU]/h | INTRAVENOUS | Status: DC
  Administered 2020-07-26: 1100 [IU]/h via INTRAVENOUS
  Filled 2020-07-25: qty 250

## 2020-07-25 MED ORDER — ATORVASTATIN CALCIUM 40 MG PO TABS: 40.0000 mg | ORAL_TABLET | Freq: Every day | ORAL | 3 refills | Status: DC

## 2020-07-25 NOTE — Patient Instructions (Addendum)
Medication Instructions:   START Atorvastatin 40 mg daily  TAKE Nitroglycerin 0.4 mg sublingual tablet under your tongue every 5 minutes for chest pain  *If you need a refill on your cardiac medications before your next appointment, please call your pharmacy*   Lab Work: Your physician recommends that you return for lab work in:   BMET  CBC  Troponin T STAT  If you have labs (blood work) drawn today and your tests are completely normal, you will receive your results only by: Marland Kitchen MyChart Message (if you have MyChart) OR . A paper copy in the mail If you have any lab test that is abnormal or we need to change your treatment, we will call you to review the results.   Testing/Procedures: Your physician has requested that you have an echocardiogram. Echocardiography is a painless test that uses sound waves to create images of your heart. It provides your doctor with information about the size and shape of your heart and how well your heart's chambers and valves are working. This procedure takes approximately one hour. There are no restrictions for this procedure.  Follow-Up: At [facility], you and your health needs are our priority.  As part of our continuing mission to provide you with exceptional heart care, we have created designated Provider Care Teams.  These Care Teams include your primary Cardiologist (physician) and Advanced Practice Providers (APPs -  Physician Assistants and Nurse Practitioners) who all work together to provide you with the care you need, when you need it.   Your next appointment:   2 week(s)  The format for your next appointment:   In Person  Provider:   Quay Burow, MD   Other Instructions

## 2020-07-25 NOTE — ED Triage Notes (Signed)
Patient reports intermittent central chest pain onset Sunday this week with mild SOB , no emesis or diaphoresis .

## 2020-07-25 NOTE — Progress Notes (Signed)
ANTICOAGULATION CONSULT NOTE - Initial Consult  Pharmacy Consult for Heparin Indication: chest pain /ACS  Allergies  Allergen Reactions  . Codeine Nausea Only    Patient Measurements: Height: 6' (182.9 cm) Weight: 90 kg (198 lb 6.6 oz) IBW/kg (Calculated) : 77.6  Vital Signs: Temp: 97.9 F (36.6 C) (03/28 2311) Temp Source: Oral (03/28 2311) BP: 130/71 (03/28 2311) Pulse Rate: 60 (03/28 2311)  Labs: Recent Labs    07/25/20 1619 07/25/20 2105  HGB 16.3 16.1  HCT 48.4 50.0  PLT 284 280  CREATININE WILL FOLLOW 1.19  TROPONINIHS  --  341*    Estimated Creatinine Clearance: 65.2 mL/min (by C-G formula based on SCr of 1.19 mg/dL).   Medical History: Past Medical History:  Diagnosis Date  . Arthritis   . Asthma   . Colon polyps   . GERD (gastroesophageal reflux disease)   . Primary localized osteoarthritis of left knee 10/05/2019    Medications:  No current facility-administered medications on file prior to encounter.   Current Outpatient Medications on File Prior to Encounter  Medication Sig Dispense Refill  . aspirin 81 MG tablet Take 81 mg by mouth daily.    Marland Kitchen atorvastatin (LIPITOR) 40 MG tablet Take 1 tablet (40 mg total) by mouth daily. 90 tablet 3  . cephALEXin (KEFLEX) 500 MG capsule Take 500 mg by mouth 4 (four) times daily.    Marland Kitchen doxycycline (VIBRA-TABS) 100 MG tablet Take 100 mg by mouth 3 (three) times daily.    . fish oil-omega-3 fatty acids 1000 MG capsule Take 1 g by mouth daily.     . nitroGLYCERIN (NITROSTAT) 0.4 MG SL tablet Place 1 tablet (0.4 mg total) under the tongue every 5 (five) minutes as needed for chest pain. 30 tablet 3     Assessment: 70 y.o. male with chest pain for heparin  Goal of Therapy:  Heparin level 0.3-0.7 units/ml Monitor platelets by anticoagulation protocol: Yes   Plan:  Heparin 4000 units IV bolus, then start heparin 1100 units/hr Check heparin level in 8 hours.   Caryl Pina 07/25/2020,11:19 PM

## 2020-07-25 NOTE — Telephone Encounter (Signed)
Troponin Elevated at 79. Reviewed with DOD Dr. Radford Pax. Discussed result with the patient. He will come to [facility] ER tonight for further work up. Pt is agree with plan.

## 2020-07-25 NOTE — Telephone Encounter (Signed)
Patient was a walk in today and states he had some chest pain over the weekend--offered to schedule this afternoon at 2:45 pm with Roby Lofts, PA--patient states he has another appointment and would not be able to come.  Please call patient to discuss his symptoms--[phone]

## 2020-07-25 NOTE — ED Provider Notes (Signed)
[facility] EMERGENCY DEPARTMENT Provider Note   CSN: [id number] Arrival date & time: 07/25/20  2054   History Chief Complaint  Patient presents with  . Chest Pain    Wesley Schultz is a 70 y.o. male.  The history is provided by the patient.  Chest Pain He has history of hyperlipidemia and comes in because of an episode of chest pain 2 days ago.  He stated that at 5 AM, he was awakened by pain in his mid chest which felt like somebody had punched him.  Pain was relatively mild and he rated it at 3/10.  It lasted about 2 hours and then resolved.  He denies dyspnea, nausea, diaphoresis.  He has not had pain prior to that or after that.  He denies any recent weakness or change in stamina.  He went to his cardiologist's office today and had a troponin which was elevated and he was sent to the emergency department.  He is a cigarette smoker.  He denies history of hypertension or diabetes and there is no family history of premature coronary atherosclerosis.  Past Medical History:  Diagnosis Date  . Arthritis   . Asthma   . Colon polyps   . GERD (gastroesophageal reflux disease)   . Primary localized osteoarthritis of left knee 10/05/2019    Patient Active Problem List   Diagnosis Date Noted  . Preoperative clearance 10/23/2019  . Family history of heart disease 10/23/2019  . Atypical chest pain 10/23/2019  . Hyperlipidemia 10/06/2019  . Smoker 10/05/2019  . Primary localized osteoarthritis of left knee 10/05/2019  . 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 ADENOMATOUS COLONIC POLYP 02/17/2008  . GERD 02/17/2008  . BACK PAIN, CHRONIC 02/17/2008  . B12 DEFICIENCY 12/30/2007  . WEIGHT LOSS 12/30/2007    Past Surgical History:  Procedure Laterality Date  . BACK SURGERY     2008, 2009, 2010  . CATARACT EXTRACTION, BILATERAL    . CERVICAL SPINE SURGERY    . HERNIA REPAIR    . LUMBAR LAMINECTOMY         Family History  Problem Relation Age of Onset  . Colon cancer  Sister 6  . Diabetes Mother   . Seizures Mother   . Heart disease Father   . Stomach cancer Neg Hx     Social History   Tobacco Use  . Smoking status: Current Every Day Smoker    Packs/day: 1.00    Years: 40.00    Pack years: 40.00    Types: Cigarettes  . Smokeless tobacco: Never Used  Substance Use Topics  . Alcohol use: No  . Drug use: No    Home Medications Prior to Admission medications   Medication Sig Start Date End Date Taking? Authorizing Provider  aspirin 81 MG tablet Take 81 mg by mouth daily.    [provider]  atorvastatin (LIPITOR) 40 MG tablet Take 1 tablet (40 mg total) by mouth daily. 07/25/20 10/23/20  Kroeger, Lorelee Cover., PA-C  cephALEXin (KEFLEX) 500 MG capsule Take 500 mg by mouth 4 (four) times daily. 07/18/20   [provider]  doxycycline (VIBRA-TABS) 100 MG tablet Take 100 mg by mouth 3 (three) times daily. 07/18/20   [provider]  fish oil-omega-3 fatty acids 1000 MG capsule Take 1 g by mouth daily.     [provider]  nitroGLYCERIN (NITROSTAT) 0.4 MG SL tablet Place 1 tablet (0.4 mg total) under the tongue every 5 (five) minutes as needed for  chest pain. 07/25/20   Kroeger, Lorelee Cover., PA-C    Allergies    Codeine  Review of Systems   Review of Systems  Cardiovascular: Positive for chest pain.  All other systems reviewed and are negative.   Physical Exam Updated Vital Signs BP (!) 150/76 (BP Location: Right Arm)   Pulse 80   Temp 97.9 F (36.6 C) (Oral)   Resp 18   Ht 6' (1.829 m)   Wt 90 kg   SpO2 97%   BMI 26.91 kg/m   Physical Exam Vitals and nursing note reviewed.   70 year old male, resting comfortably and in no acute distress. Vital signs are significant for elevated blood pressure. Oxygen saturation is 97%, which is normal. Head is normocephalic and atraumatic. PERRLA, EOMI. Oropharynx is clear. Neck is nontender and supple without adenopathy or JVD. Back is nontender and there is no CVA  tenderness. Lungs are clear without rales, wheezes, or rhonchi. Chest is nontender. Heart has regular rate and rhythm without murmur. Abdomen is soft, flat, nontender without masses or hepatosplenomegaly and peristalsis is normoactive. Extremities have 1+ edema, full range of motion is present.  Calf circumference is symmetric. Skin is warm and dry without rash. Neurologic: Mental status is normal, cranial nerves are intact, there are no motor or sensory deficits.  ED Results / Procedures / Treatments   Labs (all labs ordered are listed, but only abnormal results are displayed) Labs Reviewed  BASIC METABOLIC PANEL - Abnormal; Notable for the following components:      Result Value   Glucose, Bld 109 (*)    BUN 25 (*)    All other components within normal limits  CBC - Abnormal; Notable for the following components:   WBC 12.5 (*)    All other components within normal limits  TROPONIN I (HIGH SENSITIVITY) - Abnormal; Notable for the following components:   Troponin I (High Sensitivity) 341 (*)    All other components within normal limits  TROPONIN I (HIGH SENSITIVITY)    EKG ZAID, TOMES OZ:[id number] 25-Jul-2020 21:00:12 [facility]/ED ROUTINE RECORD Normal sinus rhythm Normal ECG When compared with ECG of 02/14/2010, No significant change was found Confirmed by Delora Fuel ([id number]) on 07/25/2020 11:06:44 PM 48mm/s 109mm/mV 100Hz  9.0.4 12SL 243 CID: [id number] Confirmed By: Delora Fuel Vent. rate 78 BPM PR interval 140 ms QRS duration 86 ms QT/QTc 352/401 ms P-R-T axes 82 81 82 [birth year] (89 yr) Male Caucasian Room:PTR2C Loc:11 Technician: [id number] Test [id number] PAIN  EKG Interpretation  Date/Time:  Monday July 25 2020 23:02:24 EDT Ventricular Rate:  57 PR Interval:  140 QRS Duration: 95 QT Interval:  401 QTC Calculation: 391 R Axis:   82 Text Interpretation: Sinus rhythm Borderline right axis deviation Anteroseptal infarct, old When compared with ECG  of EARLIER SAME DATE No significant change was found Confirmed by Delora Fuel ([id number]) on 07/25/2020 11:08:00 PM   Radiology DG Chest 2 View  Result Date: 07/25/2020 CLINICAL DATA:  Chest pain EXAM: CHEST - 2 VIEW COMPARISON:  10/05/2019 FINDINGS: The heart size and mediastinal contours are within normal limits. Both lungs are clear. The visualized skeletal structures are unremarkable. IMPRESSION: No active cardiopulmonary disease. Electronically Signed   By: Inez Catalina M.D.   On: 07/25/2020 21:28    Procedures Procedures  CRITICAL CARE Performed by: Delora Fuel Total critical care time: 35 minutes Critical care time was exclusive of separately billable procedures and treating other patients. Critical care was necessary to treat or  prevent imminent or life-threatening deterioration. Critical care was time spent personally by me on the following activities: development of treatment plan with patient and/or surrogate as well as nursing, discussions with consultants, evaluation of patient's response to treatment, examination of patient, obtaining history from patient or surrogate, ordering and performing treatments and interventions, ordering and review of laboratory studies, ordering and review of radiographic studies, pulse oximetry and re-evaluation of patient's condition.  Medications Ordered in ED Medications  heparin bolus via infusion 4,000 Units (has no administration in time range)  heparin ADULT infusion 100 units/mL (25000 units/262mL) (has no administration in time range)  aspirin chewable tablet 324 mg (324 mg Oral Given 07/25/20 2318)    ED Course  I have reviewed the triage vital signs and the nursing notes.  Pertinent labs & imaging results that were available during my care of the patient were reviewed by me and considered in my medical decision making (see chart for details).  MDM Rules/Calculators/A&P Chest pain with elevated troponin.  Old records were reviewed  confirming office visit with cardiology earlier today, outpatient troponin of 79.  ECG shows no acute changes.  Troponin repeated here was elevated to 341.  There is a mild leukocytosis which is likely reactive.  BUN is borderline elevated at 25 with normal creatinine.  Chest x-ray shows no acute process.  ECG was repeated, still showing no acute changes.  He is given aspirin and started on heparin.  Case is discussed with Dr. Paticia Stack of cardiology service, who agrees to admit the patient.  Final Clinical Impression(s) / ED Diagnoses Final diagnoses:  Non-STEMI (non-ST elevated myocardial infarction) ([place])  Elevated blood-pressure reading without diagnosis of hypertension    Rx / DC Orders ED Discharge Orders    None       Delora Fuel, MD 35/00/93 2322

## 2020-07-25 NOTE — Telephone Encounter (Signed)
Left message with the pts wife to call us after his orthopaedic appt this afternoon.

## 2020-07-25 NOTE — Progress Notes (Signed)
Cardiology Office Note   Date:  07/25/2020   ID:  Wesley Schultz, DOB 1951-04-30, MRN [id number]  PCP:  Sharion Balloon, FNP  Cardiologist:  Quay Burow, MD EP: None  Chief Complaint  Patient presents with  . Chest Pain      History of Present Illness: Wesley Schultz is a 70 y.o. male with a PMH of GERD, asthma, arthritis, and tobacco abuse who presents for evaluation of chest pain.  Patient was last evaluated by cardiology at an outpatient visit with Dr. Gwenlyn Found for preop assessment 11/2019. He underwent an NST 10/2019 in anticipation of upcoming knee replacement surgery. Stress test showed EF 47%, inferior TWI during stress, and a medium defect of moderate severity in the mid-inferior, apical inferior, and apex location c/w inferior ischemia. It was recommended on stress test read to obtain an echocardiogram to better assess systolic function, though does not appear this was ordered. He was subsequently cleared for knee surgery which he tolerated without cardiac complications.   He presents today for urgent visit to evaluate recent chest pain. He reports he was woken from sleep 07/23/20 with chest pain. He described it as a pressure sensation in his chest which was non-radiating. He had no associated SOB, diaphoresis, dizziness, lightheadedness, nausea, or vomiting. There were no clear alleviating or exacerbating symptoms. Pain ultimately resolved after 2 hours. He has not had any recurrent symptoms since that time. Prior to this episode he denied issues with chest pain. He reports occasional pains in his left arm which he always attributed to MSK issues. He denies prior history of CAD, though reports father had first MI at 13 and died from his 59nd MI shortly thereafter. He has longstanding tobacco use history, smoking at least 1ppd for the past 50+ years. Of note, patient is completing a course of antibiotics (doxycycline and cephalexin) for LLE cellulitis which has been improving.    Past  Medical History:  Diagnosis Date  . Arthritis   . Asthma   . Colon polyps   . GERD (gastroesophageal reflux disease)   . Primary localized osteoarthritis of left knee 10/05/2019    Past Surgical History:  Procedure Laterality Date  . BACK SURGERY     2008, 2009, 2010  . CATARACT EXTRACTION, BILATERAL    . CERVICAL SPINE SURGERY    . HERNIA REPAIR    . LUMBAR LAMINECTOMY       Current Outpatient Medications  Medication Sig Dispense Refill  . aspirin 81 MG tablet Take 81 mg by mouth daily.    Marland Kitchen atorvastatin (LIPITOR) 40 MG tablet Take 1 tablet (40 mg total) by mouth daily. 90 tablet 3  . cephALEXin (KEFLEX) 500 MG capsule Take 500 mg by mouth 4 (four) times daily.    Marland Kitchen doxycycline (VIBRA-TABS) 100 MG tablet Take 100 mg by mouth 3 (three) times daily.    . fish oil-omega-3 fatty acids 1000 MG capsule Take 1 g by mouth daily.     . nitroGLYCERIN (NITROSTAT) 0.4 MG SL tablet Place 1 tablet (0.4 mg total) under the tongue every 5 (five) minutes as needed for chest pain. 30 tablet 3   No current facility-administered medications for this visit.    Allergies:   Codeine    Social History:  The patient  reports that he has been smoking cigarettes. He has a 40.00 pack-year smoking history. He has never used smokeless tobacco. He reports that he does not drink alcohol and does not use drugs.  Family History:  The patient's family history includes Colon cancer (age of onset: 52) in his sister; Diabetes in his mother; Heart disease in his father; Seizures in his mother.    ROS:  Please see the history of present illness.   Otherwise, review of systems are positive for none.   All other systems are reviewed and negative.    PHYSICAL EXAM: VS:  BP 130/60 (BP Location: Left Arm, Patient Position: Sitting, Cuff Size: Normal)   Pulse 63   Ht 6' (1.829 m)   Wt 177 lb (80.3 kg)   SpO2 94%   BMI 24.01 kg/m  , BMI Body mass index is 24.01 kg/m. GEN: Well nourished, well developed, in no  acute distress HEENT: sclera anicteric Neck: no JVD, carotid bruits, or masses Cardiac: RRR; no murmurs, rubs, or gallops, trace LLE edema  Respiratory:  Scattered expiratory wheezing, no rhonchi or crakles GI: soft, nontender, nondistended, + BS MS: no deformity or atrophy Skin: warm and dry, no rash Neuro:  Strength and sensation are intact Psych: euthymic mood, full affect   EKG:  EKG is ordered today. The ekg ordered today demonstrates sinus rhythm, rate 63 bpm, non-specific T wave abnormalities, no STE/D, no significant change from previous.    Recent Labs: 10/05/2019: ALT 16; BUN 24; Creatinine, Ser 0.84; Hemoglobin 16.0; Platelets 174; Potassium 4.1; Sodium 138; TSH 4.210    Lipid Panel    Component Value Date/Time   CHOL 186 10/05/2019 1501   TRIG 223 (H) 10/05/2019 1501   HDL 45 10/05/2019 1501   CHOLHDL 4.1 10/05/2019 1501   LDLCALC 103 (H) 10/05/2019 1501      Wt Readings from Last 3 Encounters:  07/25/20 177 lb (80.3 kg)  07/11/20 179 lb (81.2 kg)  12/15/19 185 lb 3.2 oz (84 kg)      Other studies Reviewed: Additional studies/ records that were reviewed today include:   NST 10/2019:  The left ventricular ejection fraction is mildly decreased (45-54%).  Nuclear stress EF: 47%.  There was no ST segment deviation noted during stress.  T wave inversion was noted during stress in the II, III and aVF leads.  Defect 1: There is a medium defect of moderate severity present in the mid inferior, apical inferior and apex location.  Findings consistent with inferior ischemia.  Visually, systolic function appears normal. Consider obtaining an echo to better assess systolic function.       ASSESSMENT AND PLAN:  1. Chest pain in patient with abnormal NST 11/2019: patient presents with chest pain 07/23/20 that awoke him from sleep, lasted 2 hours before resolving spontaneously. No associated symptoms. Prior NST 11/2019 with possible inferior ischemia vs artifact.  Risk factors for CAD include HLD, longstanding tobacco abuse history, and family history of CAD.  - Discussed with Dr. Stanford Breed. Will plan to check a stat Troponin - if negative, will pursue a coronary CTA; if positive, recommend he present to the ED for urgent heart catheterization. Sign out given to Robbie Lis, PA on call who will notify patient of results.  - Will place order for an echocardiogram which can be canceled if patient is admitted.  - Continue aspirin - Will start atorvastatin 40mg  daily - Will send Rx for SL nitro - administration instructions reviewed.   2. HLD: LDL 103 09/2019 - Will start atorvastatin 40mg  daily - Will need FLP/LFTs in 6-8 weeks for close monitoring - this can be coordinated at his follow-up visit.   3. Tobacco abuse: longstanding history -  50+ years smoking at least 1ppd  - Continue to encourage cessation  4. Asthma: patient with wheezing on exam. Suspicions high for underlying COPD, though patient has not had a formal evaluation/diagnosis. He reports he has inhalers at home that he uses as needed.  - Encouraged PCP follow-up and consider establishing care with a pulmonologist.     Current medicines are reviewed at length with the patient today.  The patient does not have concerns regarding medicines.  The following changes have been made:  As above  Labs/ tests ordered today include:   Orders Placed This Encounter  Procedures  . Troponin T  . Basic metabolic panel  . CBC  . EKG 12-Lead  . ECHOCARDIOGRAM COMPLETE     Disposition:   FU with Dr. Gwenlyn Found in 1 month.  Signed, Abigail Butts, PA-C  07/25/2020 5:44 PM

## 2020-07-26 ENCOUNTER — Inpatient Hospital Stay (HOSPITAL_COMMUNITY): Payer: Medicare Other

## 2020-07-26 ENCOUNTER — Encounter (HOSPITAL_COMMUNITY)
Admission: EM | Disposition: A | Discharge status: Home / Self Care | Payer: Self-pay | Source: Home / Self Care | Attending: Cardiology

## 2020-07-26 DIAGNOSIS — R079 Chest pain, unspecified: Secondary | ICD-10-CM

## 2020-07-26 DIAGNOSIS — R778 Other specified abnormalities of plasma proteins: Secondary | ICD-10-CM | POA: Diagnosis not present

## 2020-07-26 DIAGNOSIS — M1712 Unilateral primary osteoarthritis, left knee: Secondary | ICD-10-CM | POA: Diagnosis present

## 2020-07-26 DIAGNOSIS — Z8249 Family history of ischemic heart disease and other diseases of the circulatory system: Secondary | ICD-10-CM | POA: Diagnosis not present

## 2020-07-26 DIAGNOSIS — R030 Elevated blood-pressure reading, without diagnosis of hypertension: Secondary | ICD-10-CM | POA: Diagnosis present

## 2020-07-26 DIAGNOSIS — F17200 Nicotine dependence, unspecified, uncomplicated: Secondary | ICD-10-CM

## 2020-07-26 DIAGNOSIS — Z800 Family history of malignant neoplasm of digestive organs: Secondary | ICD-10-CM | POA: Diagnosis not present

## 2020-07-26 DIAGNOSIS — J449 Chronic obstructive pulmonary disease, unspecified: Secondary | ICD-10-CM | POA: Diagnosis present

## 2020-07-26 DIAGNOSIS — E785 Hyperlipidemia, unspecified: Secondary | ICD-10-CM

## 2020-07-26 DIAGNOSIS — Z20822 Contact with and (suspected) exposure to covid-19: Secondary | ICD-10-CM | POA: Diagnosis present

## 2020-07-26 DIAGNOSIS — I2510 Atherosclerotic heart disease of native coronary artery without angina pectoris: Secondary | ICD-10-CM | POA: Diagnosis not present

## 2020-07-26 DIAGNOSIS — Z7982 Long term (current) use of aspirin: Secondary | ICD-10-CM | POA: Diagnosis not present

## 2020-07-26 DIAGNOSIS — Z8719 Personal history of other diseases of the digestive system: Secondary | ICD-10-CM | POA: Diagnosis not present

## 2020-07-26 DIAGNOSIS — Z79899 Other long term (current) drug therapy: Secondary | ICD-10-CM | POA: Diagnosis not present

## 2020-07-26 DIAGNOSIS — R7989 Other specified abnormal findings of blood chemistry: Secondary | ICD-10-CM

## 2020-07-26 DIAGNOSIS — I214 Non-ST elevation (NSTEMI) myocardial infarction: Secondary | ICD-10-CM | POA: Diagnosis not present

## 2020-07-26 DIAGNOSIS — I25119 Atherosclerotic heart disease of native coronary artery with unspecified angina pectoris: Secondary | ICD-10-CM | POA: Diagnosis present

## 2020-07-26 DIAGNOSIS — Z833 Family history of diabetes mellitus: Secondary | ICD-10-CM | POA: Diagnosis not present

## 2020-07-26 DIAGNOSIS — F17210 Nicotine dependence, cigarettes, uncomplicated: Secondary | ICD-10-CM | POA: Diagnosis present

## 2020-07-26 HISTORY — PX: LEFT HEART CATH AND CORONARY ANGIOGRAPHY: CATH118249

## 2020-07-26 LAB — BASIC METABOLIC PANEL
Anion gap: 7 (ref 5–15)
BUN/Creatinine Ratio: 23 (ref 10–24)
BUN: 25 mg/dL (ref 8–27)
BUN: 27 mg/dL — ABNORMAL HIGH (ref 8–23)
CO2: 23 mmol/L (ref 20–29)
CO2: 25 mmol/L (ref 22–32)
Calcium: 9.4 mg/dL (ref 8.6–10.2)
Calcium: 8.6 mg/dL — ABNORMAL LOW (ref 8.9–10.3)
Chloride: 100 mmol/L (ref 96–106)
Chloride: 105 mmol/L (ref 98–111)
Creatinine, Ser: 1.08 mg/dL (ref 0.76–1.27)
Creatinine, Ser: 0.87 mg/dL (ref 0.61–1.24)
GFR, Estimated: >60 mL/min (ref >60)
Glucose, Bld: 108 mg/dL — ABNORMAL HIGH (ref 70–99)
Glucose: 100 mg/dL — ABNORMAL HIGH (ref 65–99)
Potassium: 4.9 mmol/L (ref 3.5–5.2)
Potassium: 4.4 mmol/L (ref 3.5–5.1)
Sodium: 138 mmol/L (ref 134–144)
Sodium: 137 mmol/L (ref 135–145)
eGFR: 75 mL/min/{1.73_m2} (ref >59)

## 2020-07-26 LAB — CBC
HCT: 47.3 % (ref 39.0–52.0)
Hematocrit: 48.4 % (ref 37.5–51.0)
Hemoglobin: 16.3 g/dL (ref 13.0–17.7)
Hemoglobin: 15.4 g/dL (ref 13.0–17.0)
MCH: 29 pg (ref 26.6–33.0)
MCH: 29.7 pg (ref 26.0–34.0)
MCHC: 33.7 g/dL (ref 31.5–35.7)
MCHC: 32.6 g/dL (ref 30.0–36.0)
MCV: 86 fL (ref 79–97)
MCV: 91.1 fL (ref 80.0–100.0)
Platelets: 284 10*3/uL (ref 150–450)
Platelets: 247 10*3/uL (ref 150–400)
RBC: 5.62 x10E6/uL (ref 4.14–5.80)
RBC: 5.19 MIL/uL (ref 4.22–5.81)
RDW: 12.7 % (ref 11.6–15.4)
RDW: 13.7 % (ref 11.5–15.5)
WBC: 12.9 10*3/uL — ABNORMAL HIGH (ref 3.4–10.8)
WBC: 10.8 10*3/uL — ABNORMAL HIGH (ref 4.0–10.5)
nRBC: 0 % (ref 0.0–0.2)

## 2020-07-26 LAB — ECHOCARDIOGRAM COMPLETE
Area-P 1/2: 2.39 cm2
Height: 72 in
S' Lateral: 3.7 cm
Weight: 2832 oz

## 2020-07-26 LAB — TROPONIN I (HIGH SENSITIVITY)
Troponin I (High Sensitivity): 341 ng/L (ref <18)
Troponin I (High Sensitivity): 324 ng/L (ref <18)
Troponin I (High Sensitivity): 266 ng/L (ref <18)
Troponin I (High Sensitivity): 255 ng/L (ref <18)

## 2020-07-26 LAB — HIV ANTIBODY (ROUTINE TESTING W REFLEX): HIV Screen 4th Generation wRfx: NONREACTIVE

## 2020-07-26 LAB — LIPID PANEL
Cholesterol: 131 mg/dL (ref 0–200)
HDL: 43 mg/dL (ref >40)
LDL Cholesterol: 81 mg/dL (ref 0–99)
Total CHOL/HDL Ratio: 3 RATIO
Triglycerides: 36 mg/dL (ref <150)
VLDL: 7 mg/dL (ref 0–40)

## 2020-07-26 LAB — PROTIME-INR
INR: 1.1 (ref 0.8–1.2)
Prothrombin Time: 13.7 seconds (ref 11.4–15.2)

## 2020-07-26 LAB — SARS CORONAVIRUS 2 (TAT 6-24 HRS): SARS Coronavirus 2: NEGATIVE

## 2020-07-26 LAB — HEPARIN LEVEL (UNFRACTIONATED): Heparin Unfractionated: 0.43 IU/mL (ref 0.30–0.70)

## 2020-07-26 SURGERY — LEFT HEART CATH AND CORONARY ANGIOGRAPHY
Anesthesia: LOCAL

## 2020-07-26 MED ORDER — ATORVASTATIN CALCIUM 40 MG PO TABS
40.0000 mg | ORAL_TABLET | Freq: Every day | ORAL | Status: DC
  Administered 2020-07-26: 40 mg via ORAL
  Filled 2020-07-26: qty 1

## 2020-07-26 MED ORDER — MIDAZOLAM HCL 2 MG/2ML IJ SOLN
INTRAMUSCULAR | Status: DC | PRN
  Administered 2020-07-26: 2 mg via INTRAVENOUS

## 2020-07-26 MED ORDER — LABETALOL HCL 5 MG/ML IV SOLN: 10.0000 mg | INTRAVENOUS | Status: DC | PRN

## 2020-07-26 MED ORDER — CLOPIDOGREL BISULFATE 75 MG PO TABS: 75.0000 mg | ORAL_TABLET | Freq: Every day | ORAL | 11 refills | Status: DC

## 2020-07-26 MED ORDER — FENTANYL CITRATE (PF) 100 MCG/2ML IJ SOLN
INTRAMUSCULAR | Status: AC
  Filled 2020-07-26: qty 2

## 2020-07-26 MED ORDER — MIDAZOLAM HCL 2 MG/2ML IJ SOLN
INTRAMUSCULAR | Status: AC
  Filled 2020-07-26: qty 2

## 2020-07-26 MED ORDER — VERAPAMIL HCL 2.5 MG/ML IV SOLN
INTRAVENOUS | Status: AC
  Filled 2020-07-26: qty 2

## 2020-07-26 MED ORDER — LIDOCAINE HCL (PF) 1 % IJ SOLN
INTRAMUSCULAR | Status: AC
  Filled 2020-07-26: qty 30

## 2020-07-26 MED ORDER — NITROGLYCERIN 0.4 MG SL SUBL: 0.4000 mg | SUBLINGUAL_TABLET | SUBLINGUAL | Status: DC | PRN

## 2020-07-26 MED ORDER — SODIUM CHLORIDE 0.9% FLUSH: 3.0000 mL | INTRAVENOUS | Status: DC | PRN

## 2020-07-26 MED ORDER — IOHEXOL 350 MG/ML SOLN
INTRAVENOUS | Status: DC | PRN
  Administered 2020-07-26: 50 mL

## 2020-07-26 MED ORDER — ALBUTEROL SULFATE HFA 108 (90 BASE) MCG/ACT IN AERS
2.0000 | INHALATION_SPRAY | RESPIRATORY_TRACT | Status: DC | PRN
  Filled 2020-07-26: qty 6.7

## 2020-07-26 MED ORDER — SODIUM CHLORIDE 0.9% FLUSH: 3.0000 mL | Freq: Two times a day (BID) | INTRAVENOUS | Status: DC

## 2020-07-26 MED ORDER — HYDRALAZINE HCL 20 MG/ML IJ SOLN: 10.0000 mg | INTRAMUSCULAR | Status: DC | PRN

## 2020-07-26 MED ORDER — HEPARIN SODIUM (PORCINE) 1000 UNIT/ML IJ SOLN
INTRAMUSCULAR | Status: AC
  Filled 2020-07-26: qty 1

## 2020-07-26 MED ORDER — CLOPIDOGREL BISULFATE 300 MG PO TABS
300.0000 mg | ORAL_TABLET | Freq: Once | ORAL | Status: AC
  Administered 2020-07-26: 300 mg via ORAL
  Filled 2020-07-26: qty 1

## 2020-07-26 MED ORDER — AMLODIPINE BESYLATE 2.5 MG PO TABS
2.5000 mg | ORAL_TABLET | Freq: Every day | ORAL | Status: DC
  Administered 2020-07-26: 2.5 mg via ORAL
  Filled 2020-07-26: qty 1

## 2020-07-26 MED ORDER — SODIUM CHLORIDE 0.9 % WEIGHT BASED INFUSION
1.0000 mL/kg/h | INTRAVENOUS | Status: DC
  Administered 2020-07-26: 1 mL/kg/h via INTRAVENOUS

## 2020-07-26 MED ORDER — ACETAMINOPHEN 325 MG PO TABS: 650.0000 mg | ORAL_TABLET | ORAL | Status: DC | PRN

## 2020-07-26 MED ORDER — SODIUM CHLORIDE 0.9 % WEIGHT BASED INFUSION
3.0000 mL/kg/h | INTRAVENOUS | Status: DC
  Administered 2020-07-26: 3 mL/kg/h via INTRAVENOUS

## 2020-07-26 MED ORDER — ONDANSETRON HCL 4 MG/2ML IJ SOLN: 4.0000 mg | Freq: Four times a day (QID) | INTRAMUSCULAR | Status: DC | PRN

## 2020-07-26 MED ORDER — LIDOCAINE HCL (PF) 1 % IJ SOLN
INTRAMUSCULAR | Status: DC | PRN
  Administered 2020-07-26: 2 mL

## 2020-07-26 MED ORDER — ASPIRIN EC 81 MG PO TBEC: 81.0000 mg | DELAYED_RELEASE_TABLET | Freq: Every day | ORAL | Status: DC

## 2020-07-26 MED ORDER — SODIUM CHLORIDE 0.9 % IV SOLN: INTRAVENOUS | Status: DC

## 2020-07-26 MED ORDER — IPRATROPIUM BROMIDE HFA 17 MCG/ACT IN AERS
2.0000 | INHALATION_SPRAY | Freq: Four times a day (QID) | RESPIRATORY_TRACT | Status: DC | PRN
  Filled 2020-07-26: qty 12.9

## 2020-07-26 MED ORDER — FENTANYL CITRATE (PF) 100 MCG/2ML IJ SOLN
INTRAMUSCULAR | Status: DC | PRN
  Administered 2020-07-26: 25 ug via INTRAVENOUS

## 2020-07-26 MED ORDER — SODIUM CHLORIDE 0.9 % IV SOLN: 250.0000 mL | INTRAVENOUS | Status: DC | PRN

## 2020-07-26 MED ORDER — HEPARIN (PORCINE) IN NACL 1000-0.9 UT/500ML-% IV SOLN
INTRAVENOUS | Status: DC | PRN
  Administered 2020-07-26 (×2): 500 mL

## 2020-07-26 MED ORDER — ASPIRIN 81 MG PO TABS: 81.0000 mg | ORAL_TABLET | Freq: Every day | ORAL | Status: DC

## 2020-07-26 MED ORDER — ASPIRIN 81 MG PO CHEW
81.0000 mg | CHEWABLE_TABLET | ORAL | Status: AC
  Administered 2020-07-26: 81 mg via ORAL
  Filled 2020-07-26: qty 1

## 2020-07-26 MED ORDER — HEPARIN (PORCINE) IN NACL 1000-0.9 UT/500ML-% IV SOLN
INTRAVENOUS | Status: AC
  Filled 2020-07-26: qty 1000

## 2020-07-26 MED ORDER — AMLODIPINE BESYLATE 2.5 MG PO TABS: 2.5000 mg | ORAL_TABLET | Freq: Every day | ORAL | 11 refills | Status: DC

## 2020-07-26 MED ORDER — HEPARIN SODIUM (PORCINE) 1000 UNIT/ML IJ SOLN
INTRAMUSCULAR | Status: DC | PRN
  Administered 2020-07-26: 4000 [IU] via INTRAVENOUS

## 2020-07-26 MED ORDER — VERAPAMIL HCL 2.5 MG/ML IV SOLN
INTRAVENOUS | Status: DC | PRN
  Administered 2020-07-26: 10 mL via INTRA_ARTERIAL

## 2020-07-26 SURGICAL SUPPLY — 9 items

## 2020-07-26 NOTE — Discharge Instructions (Signed)

## 2020-07-26 NOTE — Plan of Care (Signed)
Initiation of Care Plan Problem: Education: Goal: Knowledge of General Education information will improve Description: Including pain rating scale, medication(s)/side effects and non-pharmacologic comfort measures Outcome: Progressing   Problem: Health Behavior/Discharge Planning: Goal: Ability to manage health-related needs will improve Outcome: Progressing   Problem: Clinical Measurements: Goal: Ability to maintain clinical measurements within normal limits will improve Outcome: Progressing Goal: Will remain free from infection Outcome: Progressing Goal: Diagnostic test results will improve Outcome: Progressing Goal: Respiratory complications will improve Outcome: Progressing Goal: Cardiovascular complication will be avoided Outcome: Progressing   Problem: Activity: Goal: Risk for activity intolerance will decrease Outcome: Progressing   Problem: Nutrition: Goal: Adequate nutrition will be maintained Outcome: Progressing   Problem: Coping: Goal: Level of anxiety will decrease Outcome: Progressing   Problem: Elimination: Goal: Will not experience complications related to bowel motility Outcome: Progressing Goal: Will not experience complications related to urinary retention Outcome: Progressing   Problem: Pain Managment: Goal: General experience of comfort will improve Outcome: Progressing   Problem: Safety: Goal: Ability to remain free from injury will improve Outcome: Progressing   Problem: Skin Integrity: Goal: Risk for impaired skin integrity will decrease Outcome: Progressing   Problem: Education: Goal: Understanding of CV disease, CV risk reduction, and recovery process will improve Outcome: Progressing Goal: Individualized Educational Video(s) Outcome: Progressing   Problem: Activity: Goal: Ability to return to baseline activity level will improve Outcome: Progressing   Problem: Cardiovascular: Goal: Ability to achieve and maintain adequate  cardiovascular perfusion will improve Outcome: Progressing Goal: Vascular access site(s) Level 0-1 will be maintained Outcome: Progressing   Problem: Health Behavior/Discharge Planning: Goal: Ability to safely manage health-related needs after discharge will improve Outcome: Progressing

## 2020-07-26 NOTE — Progress Notes (Signed)
TR Band removed and pressure dressing applied. Pt instructions given with verbal teachback. Will continue to monitor.

## 2020-07-26 NOTE — H&P (Signed)
Cardiology Admission History and Physical:   Patient ID: Wesley Schultz; MRN: [id number]; DOB: 06/19/1950   Admission date: 07/25/2020  Primary Care Provider: Sharion Balloon, FNP Primary Cardiologist: Quay Burow, MD    Chief Complaint:  Chest pain  History of Present Illness:   Wesley Schultz is a 70 y.o. male with a history of osteoarthritis, GERD, asthma and tobacco abuse, who was seen in clinic earlier for complaints of chest pain. He had an episode of chest discomfort 2 days ago that woke him from sleep (at around 5 am). It was pressure-like, non-radiating, and constant for 2 hours. No recurrence of the pain since then. He denied any nausea, vomiting, diaphoresis or SOB. No orthopnea or PND.  In July 2021, he underwent a myocardial perfusion study (for risk stratification prior to knee surgery). This documented EF 47%, with a medium defect of moderate severity in the mid-inferior, apical inferior, and apex location c/w inferior ischemia. There was also a concern this could be diaphragmatic attenuation. He did well with his knee surgery.  The ECG from 07/25/2020, that I reviewed personally, demonstrates sinus rhythm, rate 78 bpm, non-specific T wave abnormalities, and poor R progression in the anterior leads.    Past Medical History:  Diagnosis Date  . Arthritis   . Asthma   . Colon polyps   . GERD (gastroesophageal reflux disease)   . Primary localized osteoarthritis of left knee 10/05/2019    Past Surgical History:  Procedure Laterality Date  . BACK SURGERY     2008, 2009, 2010  . CATARACT EXTRACTION, BILATERAL    . CERVICAL SPINE SURGERY    . HERNIA REPAIR    . LUMBAR LAMINECTOMY       Medications Prior to Admission: Prior to Admission medications   Medication Sig Start Date End Date Taking? Authorizing Provider  aspirin 81 MG tablet Take 81 mg by mouth daily.    [provider]  atorvastatin (LIPITOR) 40 MG tablet Take 1 tablet (40 mg total) by mouth daily.  07/25/20 10/23/20  Kroeger, Lorelee Cover., PA-C  cephALEXin (KEFLEX) 500 MG capsule Take 500 mg by mouth 4 (four) times daily. 07/18/20   [provider]  doxycycline (VIBRA-TABS) 100 MG tablet Take 100 mg by mouth 3 (three) times daily. 07/18/20   [provider]  fish oil-omega-3 fatty acids 1000 MG capsule Take 1 g by mouth daily.     [provider]  nitroGLYCERIN (NITROSTAT) 0.4 MG SL tablet Place 1 tablet (0.4 mg total) under the tongue every 5 (five) minutes as needed for chest pain. 07/25/20   Kroeger, Lorelee Cover., PA-C     Allergies:    Allergies  Allergen Reactions  . Codeine Nausea Only    Social History:   Social History   Socioeconomic History  . Marital status: Married    Spouse name: Not on file  . Number of children: Not on file  . Years of education: Not on file  . Highest education level: Not on file  Occupational History  . Not on file  Tobacco Use  . Smoking status: Current Every Day Smoker    Packs/day: 1.00    Years: 40.00    Pack years: 40.00    Types: Cigarettes  . Smokeless tobacco: Never Used  Substance and Sexual Activity  . Alcohol use: No  . Drug use: No  . Sexual activity: Never  Other Topics Concern  . Not on file  Social History Narrative  . Not  on file   Social Determinants of Health   Financial Resource Strain: Not on file  Food Insecurity: Not on file  Transportation Needs: Not on file  Physical Activity: Not on file  Stress: Not on file  Social Connections: Not on file  Intimate Partner Violence: Not on file     Family History:   The patient's family history includes Colon cancer (age of onset: 53) in his sister; Diabetes in his mother; Heart disease in his father; Seizures in his mother. There is no history of Stomach cancer.     Review of Systems: [y] = yes, [ ]  = no   . General: Weight gain [ ] ; Weight loss [ ] ; Anorexia [ ] ; Fatigue [ ] ; Fever [ ] ; Chills [ ] ; Weakness [ ]   . Cardiac: Chest pain/pressure  [Y]; Resting SOB [ ] ; Exertional SOB [ ] ; Orthopnea [ ] ; Pedal Edema [ ] ; Palpitations [ ] ; Syncope [ ] ; Presyncope [ ] ; Paroxysmal nocturnal dyspnea[ ]   . Pulmonary: Cough [ ] ; Wheezing[ ] ; Hemoptysis[ ] ; Sputum [ ] ; Snoring [ ]   . GI: Vomiting[ ] ; Dysphagia[ ] ; Melena[ ] ; Hematochezia [ ] ; Heartburn[ ] ; Abdominal pain [ ] ; Constipation [ ] ; Diarrhea [ ] ; BRBPR [ ]   . GU: Hematuria[ ] ; Dysuria [ ] ; Nocturia[ ]   . Vascular: Pain in legs with walking [ ] ; Pain in feet with lying flat [ ] ; Non-healing sores [ ] ; Stroke [ ] ; TIA [ ] ; Slurred speech [ ] ;  . Neuro: Headaches[ ] ; Vertigo[ ] ; Seizures[ ] ; Paresthesias[ ] ;Blurred vision [ ] ; Diplopia [ ] ; Vision changes [ ]   . Ortho/Skin: Arthritis [ ] ; Joint pain [ ] ; Muscle pain [ ] ; Joint swelling [ ] ; Back Pain [ ] ; Rash [ ]   . Psych: Depression[ ] ; Anxiety[ ]   . Heme: Bleeding problems [ ] ; Clotting disorders [ ] ; Anemia [ ]   . Endocrine: Diabetes [ ] ; Thyroid dysfunction[ ]      Physical Exam/Data:   Vitals:   07/25/20 2102 07/25/20 2102 07/25/20 2311  BP:  (!) 150/76 130/71  Pulse:  80 60  Resp:  18 (!) 21  Temp:  97.9 F (36.6 C) 97.9 F (36.6 C)  TempSrc:  Oral Oral  SpO2:  97% 98%  Weight: 90 kg    Height: 6' (1.829 m)     No intake or output data in the 24 hours ending 07/26/20 0017 Filed Weights   07/25/20 2102  Weight: 90 kg   Body mass index is 26.91 kg/m.  General:  Well nourished, well developed, in no acute distress HEENT: normal Lymph: no adenopathy Neck: no JVD Endocrine:  No thryomegaly Vascular: No carotid bruits; FA pulses 2+ bilaterally without bruits  Cardiac:  normal S1, S2; RRR; no murmur  Lungs:  clear to auscultation bilaterally, occasional end-expiratory wheeze  Abd: soft, nontender, no hepatomegaly  Ext: no edema Musculoskeletal:  No deformities, BUE and BLE strength normal and equal Skin: warm and dry  Neuro:  CNs 2-12 intact, no focal abnormalities noted Psych:  Normal affect    EKG:  The ECG  from 07/25/2020, that I reviewed personally, demonstrates sinus rhythm, rate 78 bpm, non-specific T wave abnormalities, and poor R progression in the anterior leads.  Relevant CV Studies: Nuclear stress test - 10/30/2019  The left ventricular ejection fraction is mildly decreased (45-54%).  Nuclear stress EF: 47%.  There was no ST segment deviation noted during stress.  T wave inversion was noted during stress in the II, III  and aVF leads.  Defect 1: There is a medium defect of moderate severity present in the mid inferior, apical inferior and apex location.  Findings consistent with inferior ischemia.  Visually, systolic function appears normal. Consider obtaining an echo to better assess systolic function.    Laboratory Data:  Chemistry Recent Labs  Lab 07/25/20 1619 07/25/20 2105  NA WILL FOLLOW 139  K WILL FOLLOW 4.3  CL WILL FOLLOW 103  CO2 WILL FOLLOW 29  GLUCOSE WILL FOLLOW 109*  BUN WILL FOLLOW 25*  CREATININE WILL FOLLOW 1.19  CALCIUM WILL FOLLOW 9.0  GFRNONAA  --  >60  ANIONGAP  --  7    No results for input(s): PROT, ALBUMIN, AST, ALT, ALKPHOS, BILITOT in the last 168 hours. Hematology Recent Labs  Lab 07/25/20 1619 07/25/20 2105  WBC 12.9* 12.5*  RBC 5.62 5.50  HGB 16.3 16.1  HCT 48.4 50.0  MCV 86 90.9  MCH 29.0 29.3  MCHC 33.7 32.2  RDW 12.7 13.5  PLT 284 280   Cardiac EnzymesNo results for input(s): TROPONINI in the last 168 hours. No results for input(s): TROPIPOC in the last 168 hours.  BNPNo results for input(s): BNP, PROBNP in the last 168 hours.  DDimer No results for input(s): DDIMER in the last 168 hours.  Radiology/Studies:  DG Chest 2 View  Result Date: 07/25/2020 CLINICAL DATA:  Chest pain EXAM: CHEST - 2 VIEW COMPARISON:  10/05/2019 FINDINGS: The heart size and mediastinal contours are within normal limits. Both lungs are clear. The visualized skeletal structures are unremarkable. IMPRESSION: No active cardiopulmonary disease.  Electronically Signed   By: Inez Catalina M.D.   On: 07/25/2020 21:28    Assessment and Plan:   1.  Non-ST elevation myocardial infarction  The patient experienced chest pain 2 days ago. Symptoms were fairly typical for angina. He has not had recurrence of CP since. His high sensitivity troponins are elevated (341 and 324). His ECG shows a septal infarct (age undetermined).   -Serial ECGs -Continue to trend cardiac biomarkers -ASA 81 mg daily  -Check a lipid panel -Atorvastatin 40 mg nightly -Nitroglycerin PRN for recurrent chest pain -Unfractionated heparin IV infusion -Echo to assess LV function -Keep NPO for possible cath in AM  Cardiac catheterization was discussed with the patient fully. The patient understands that risks include but are not limited to stroke (1 in 1000), death (1 in 41), kidney failure [usually temporary] (1 in 500), bleeding (1 in 200), allergic reaction [possibly serious] (1 in 200).  The patient understands and is willing to proceed.     Severity of Illness: The appropriate patient status for this patient is INPATIENT. Inpatient status is judged to be reasonable and necessary in order to provide the required intensity of service to ensure the patient's safety. The patient's presenting symptoms, physical exam findings, and initial radiographic and laboratory data in the context of their chronic comorbidities is felt to place them at high risk for further clinical deterioration. Furthermore, it is not anticipated that the patient will be medically stable for discharge from the hospital within 2 midnights of admission. The following factors support the patient status of inpatient.   " The patient's presenting symptoms include chest pain. " The worrisome physical exam findings include NA. " The initial radiographic and laboratory data are worrisome because of abnormal ECG. " The chronic co-morbidities include asthma and tobacco abuse.   * I certify that at the  point of admission it is my clinical judgment that  the patient will require inpatient hospital care spanning beyond 2 midnights from the point of admission due to high intensity of service, high risk for further deterioration and high frequency of surveillance required.*    For questions or updates, please contact [place] Please consult [email or web address] for contact info under Cardiology/STEMI.    Signed, Meade Maw, MD  07/26/2020 12:17 AM

## 2020-07-26 NOTE — Interval H&P Note (Signed)
Cath Lab Visit (complete for each Cath Lab visit)  Clinical Evaluation Leading to the Procedure:   ACS: Yes.    Non-ACS:    Anginal Classification: CCS IV  Anti-ischemic medical therapy: Minimal Therapy (1 class of medications)  Non-Invasive Test Results: No non-invasive testing performed  Prior CABG: No previous CABG      History and Physical Interval Note:  07/26/2020 5:14 PM  [name]  has presented today for surgery, with the diagnosis of chest pain.  The various methods of treatment have been discussed with the patient and family. After consideration of risks, benefits and other options for treatment, the patient has consented to  Procedure(s): LEFT HEART CATH AND CORONARY ANGIOGRAPHY (N/A) as a surgical intervention.  The patient's history has been reviewed, patient examined, no change in status, stable for surgery.  I have reviewed the patient's chart and labs.  Questions were answered to the patient's satisfaction.     Larae Grooms

## 2020-07-26 NOTE — H&P (View-Only) (Signed)
Progress Note  Patient Name: Wesley Schultz Date of Encounter: 07/26/2020  [facility] Cardiologist: Quay Burow, MD ; Roby Lofts, PA   Patient Profile     70 y.o. male  with history of osteoarthritis, GERD, asthma and longstanding tobacco abuse who was seen by Roby Lofts in clinic on 07/25/2020 as a work in visit to evaluate an episode of chest pain lasting about 2 hours on 5 AM the morning of Sunday, 07/24/2020.  He had a pressure-like nonradiating chest pain lasted about 2 hours and woke him up from sleep.  There is no associated nausea vomiting diaphoresis or dyspnea.  No PND or orthopnea, but has had mild lower extremity edema since his knee surgeries.  He has had a prior borderline positive stress test in July 2021 is reviewed below.  Following his visit, he had troponin levels checked stat for discussion with Dr. Stanford Breed.  Plan was as follows: If negative, pursue coronary CTA, if positive, patient be contacted to present to the ER for admission for non-STEMI and urgent catheterization. => Shortly after arriving home, he was contacted that his troponin levels were elevated at 300 and he was instructed to come to the ER.  He arrived here yesterday at roughly 7 PM.  Was seen by Dr. Paticia Stack at roughly midnight.  At that time he is having no further chest pain.  EKG had nonspecific ST changes with poor R wave progression in anterior leads.  He was started on IV heparin, loaded with aspirin and given 81 mg daily along with atorvastatin 40 mg daily.  He has not required as needed nitroglycerin. 2D echo ordered hospital  Subjective   No further chest pain.  Just hungry.  Tired of lying in the ER.  Baseline wheezing. Mild left ankle swelling.  Has had bilateral swelling since his knee surgeries.  Inpatient Medications    Scheduled Meds: . [START ON 07/27/2020] aspirin EC  81 mg Oral Daily  . atorvastatin  40 mg Oral Daily   Continuous Infusions: . sodium chloride 1 mL/kg/hr  (07/26/20 1001)  . heparin 1,100 Units/hr (07/26/20 0011)   PRN Meds: acetaminophen, albuterol, ipratropium, nitroGLYCERIN, ondansetron (ZOFRAN) IV   Vital Signs    Vitals:   07/26/20 0823 07/26/20 1000 07/26/20 1100 07/26/20 1230  BP:  124/67 138/65 (!) 134/59  Pulse:  (!) 49 (!) 51 (!) 48  Resp:  19 20 16   Temp:    98.1 F (36.7 C)  TempSrc:    Oral  SpO2:  95% 96% 96%  Weight: 80.3 kg     Height: 6' (1.829 m)       Intake/Output Summary (Last 24 hours) at 07/26/2020 1357 Last data filed at 07/26/2020 1002 Gross per 24 hour  Intake 369 ml  Output 300 ml  Net 69 ml   Last 3 Weights 07/26/2020 07/25/2020 07/25/2020  Weight (lbs) 177 lb 198 lb 6.6 oz 177 lb  Weight (kg) 80.287 kg 90 kg 80.287 kg      Telemetry    Sinus rhythm- Personally Reviewed  ECG    EKG from yesterday showed heart rates 57 bpm.  Sinus rhythm.  Borderline right axis deviation.  Cannot exclude septal infarct, age-indeterminate.- Personally Reviewed  No new study for today.  Physical Exam   GEN: No acute distress.  Resting comfortably in bed.  Just getting bored. Neck: No JVD, or bruit. Cardiac: RRR, no murmurs, rubs, or gallops.  Bounding radial pulses bilaterally.  Strong femoral pulses with bilateral  bruits. Respiratory:  Mild diffuse crackles on exam with end-expiratory wheeze.  Nonlabored GI: Soft, nontender, non-distended.  NABS MS:  Trace to 1+ left ankle swelling, but otherwise no clubbing or cyanosis.  No true edema. Neuro:  Nonfocal ; CN II-XII grossly intact. Psych: Normal affect   Labs    High Sensitivity Troponin:   Recent Labs  Lab 07/25/20 2105 07/25/20 2302 07/26/20 0326 07/26/20 0731  TROPONINIHS 341* 324* 266* 255*      Chemistry Recent Labs  Lab 07/25/20 1619 07/25/20 2105 07/26/20 0326  NA 138 139 137  K 4.9 4.3 4.4  CL 100 103 105  CO2 23 29 25   GLUCOSE 100* 109* 108*  BUN 25 25* 27*  CREATININE 1.08 1.19 0.87  CALCIUM 9.4 9.0 8.6*  GFRNONAA  --  >60 >60   ANIONGAP  --  7 7    Lab Results  Component Value Date   CHOL 131 07/26/2020   HDL 43 07/26/2020   LDLCALC 81 07/26/2020   TRIG 36 07/26/2020   CHOLHDL 3.0 07/26/2020    Hematology Recent Labs  Lab 07/25/20 1619 07/25/20 2105 07/26/20 0326  WBC 12.9* 12.5* 10.8*  RBC 5.62 5.50 5.19  HGB 16.3 16.1 15.4  HCT 48.4 50.0 47.3  MCV 86 90.9 91.1  MCH 29.0 29.3 29.7  MCHC 33.7 32.2 32.6  RDW 12.7 13.5 13.7  PLT 284 280 247    BNPNo results for input(s): BNP, PROBNP in the last 168 hours.   DDimer No results for input(s): DDIMER in the last 168 hours.   Radiology    DG Chest 2 View  Result Date: 07/25/2020 CLINICAL DATA:  Chest pain EXAM: CHEST - 2 VIEW COMPARISON:  10/05/2019 FINDINGS: The heart size and mediastinal contours are within normal limits. Both lungs are clear. The visualized skeletal structures are unremarkable. IMPRESSION: No active cardiopulmonary disease. Electronically Signed   By: Inez Catalina M.D.   On: 07/25/2020 21:28    Cardiac Studies   He was initially evaluated in July 2021 with a Myoview stress test for stratification prior to knee surgery.  This showed EF of 47% with a medium size defect of moderate severity in the mid inferior, apical inferior and apica walll consistent with ischemia.  There is also considered possibility of being diaphragmatic attenuation.  He was cleared for surgery and did well with the surgery.   Assessment & Plan    Principal Problem:   NSTEMI (non-ST elevated myocardial infarction) ([place]); delayed presentation Active Problems:   Smoker   Hyperlipidemia LDL goal <70  Delayed presentation of non-STEMI.  No pain since Sunday after long episode.  I suspect he may have a subtotally occluded vessel.  Plan is for cardiac catheterization today.   2D echo pending. Remains on IV heparin; was loaded with aspirin 3.4 mg now on 81 mg daily. On aspirin and statin at 40 mg grams daily -> fasting lipid panel ordered; LDL 80.  Monitor  blood pressures, consider beta-1 selective to beta-blocker plus/minus ARB pending results of cath  Performing MD: Dr. [place] or Dr. Irish Lack  Procedure: LEFT HEART CATHETERIZATION WITH CORONARY ANGIOGRAPHY AND POSSIBLE PERCUTANEOUS CORONARY INTERVENTION  The procedure with Risks/Benefits/Alternatives and Indications was reviewed with the patient.  All questions were answered.    Risks / Complications include, but not limited to: Death, MI, CVA/TIA, VF/VT (with defibrillation), Bradycardia (need for temporary pacer placement), contrast induced nephropathy, bleeding / bruising / hematoma / pseudoaneurysm, vascular or coronary injury (with possible emergent CT  or Vascular Surgery), adverse medication reactions, infection.  Additional risks involving the use of radiation with the possibility of radiation burns and cancer were explained in detail.  The patient voices understanding and agree to proceed.      For questions or updates, please contact [place] Please consult [email or web address] for contact info under        Signed, Glenetta Hew, MD  07/26/2020, 1:57 PM

## 2020-07-26 NOTE — Discharge Summary (Signed)
Discharge Summary    Patient ID: Wesley Schultz MRN: [id number]; DOB: April 03, 1951  Admit date: 07/25/2020 Discharge date: 07/26/2020  PCP:  Sharion Balloon, [place]  Cardiologist:  Quay Burow, MD  Advanced Practice Provider:  No care team member to display Electrophysiologist:  None  [id number]}    Discharge Diagnoses    Principal Problem:   NSTEMI (non-ST elevated myocardial infarction) [facility]); delayed presentation Active Problems:   Smoker   Hyperlipidemia LDL goal <70   Elevated troponin    Diagnostic Studies/Procedures    Cardiac Cath: 07/26/2020  Ramus lesion is 50% stenosed.  The left ventricular systolic function is normal.  LV end diastolic pressure is normal.  The left ventricular ejection fraction is 55-65% by visual estimate.  There is no aortic valve stenosis.   Diffuse nonobstructive disease.  No culprit lesion for elevated troponin.  Continue preventive therapy.    D/w Dr. Ellyn Hack.  Possible discharge later today.   ECHO: 07/26/2020 1. Left ventricular ejection fraction, by estimation, is 55 to 60%. The  left ventricle has normal function. The left ventricle has no regional  wall motion abnormalities. Left ventricular diastolic parameters were  normal.  2. Right ventricular systolic function is normal. The right ventricular  size is normal. There is normal pulmonary artery systolic pressure.  3. Left atrial size was mildly dilated.  4. The mitral valve is normal in structure. Trivial mitral valve  regurgitation. No evidence of mitral stenosis.  5. The aortic valve is tricuspid. Aortic valve regurgitation is not  visualized. No aortic stenosis is present.  6. The inferior vena cava is normal in size with greater than 50%  respiratory variability, suggesting right atrial pressure of 3 mmHg.  _____________   History of Present Illness     Wesley Schultz is a 71 y.o. male with history of osteoarthritis,  GERD, asthma and longstanding tobacco abuse who was seen by Roby Lofts in clinic on 07/25/2020 as a work in visit to evaluate an episode of chest pain lasting about 2 hours on 5 AM the morning of Sunday, 07/24/2020.  He had a pressure-like nonradiating chest pain lasted about 2 hours and woke him up from sleep.  There is no associated nausea vomiting diaphoresis or dyspnea.  No PND or orthopnea, but has had mild lower extremity edema since his knee surgeries.  He has had a prior borderline positive stress test in July 2021.  Following his visit, he had troponin levels checked stat for discussion with Dr. Stanford Breed.  Plan was as follows: If negative, pursue coronary CTA, if positive, patient be contacted to present to the ER for admission for non-STEMI and urgent catheterization. => Shortly after arriving home, he was contacted that his troponin levels were elevated at 300 and he was instructed to come to the ER.  He arrived here yesterday at roughly 7 PM.  Was seen by Dr. Paticia Stack at roughly midnight.  At that time he is having no further chest pain.  EKG had nonspecific ST changes with poor R wave progression in anterior leads.  He was started on IV heparin, loaded with aspirin and given 81 mg daily along with atorvastatin 40 mg daily.  He has not required as needed nitroglycerin   Hospital Course     Consultants: None   Wesley Schultz his troponins remained elevated, but trended down from the initial level.  He was evaluated and it was felt that cardiac catheterization was the best  option for him.  He was taken to the Cath Lab on 07/26/2000.  Results are above.  He had diffuse nonobstructive disease with medical therapy recommended.  Post cath, he was ambulating without chest pain or shortness of breath.  He will have Plavix and amlodipine added to his medication regimen as well as sublingual nitro as needed.  No further inpatient work-up is indicated and he is considered stable for discharge, to  follow-up as an outpatient.  Did the patient have an acute coronary syndrome (MI, NSTEMI, STEMI, etc) this admission?:  No                               Did the patient have a percutaneous coronary intervention (stent / angioplasty)?:  No.       _____________  Discharge Vitals Blood pressure 121/71, pulse 60, temperature (!) 97.5 F (36.4 C), temperature source Oral, resp. rate 17, height 6' (1.829 m), weight 80.3 kg, SpO2 92 %.  Filed Weights   07/25/20 2102 07/26/20 0823  Weight: 90 kg 80.3 kg    Labs & Radiologic Studies    CBC Recent Labs    07/25/20 2105 07/26/20 0326  WBC 12.5* 10.8*  HGB 16.1 15.4  HCT 50.0 47.3  MCV 90.9 91.1  PLT 280 629   Basic Metabolic Panel Recent Labs    07/25/20 2105 07/26/20 0326  NA 139 137  K 4.3 4.4  CL 103 105  CO2 29 25  GLUCOSE 109* 108*  BUN 25* 27*  CREATININE 1.19 0.87  CALCIUM 9.0 8.6*   Liver Function Tests No results for input(s): AST, ALT, ALKPHOS, BILITOT, PROT, ALBUMIN in the last 72 hours. No results for input(s): LIPASE, AMYLASE in the last 72 hours. High Sensitivity Troponin:   Recent Labs  Lab 07/25/20 2105 07/25/20 2302 07/26/20 0326 07/26/20 0731  TROPONINIHS 341* 324* 266* 255*    BNP Invalid input(s): POCBNP D-Dimer No results for input(s): DDIMER in the last 72 hours. Hemoglobin A1C No results for input(s): HGBA1C in the last 72 hours. Fasting Lipid Panel Recent Labs    07/26/20 0326  CHOL 131  HDL 43  LDLCALC 81  TRIG 36  CHOLHDL 3.0   Thyroid Function Tests No results for input(s): TSH, T4TOTAL, T3FREE, THYROIDAB in the last 72 hours.  Invalid input(s): FREET3 _____________  DG Chest 2 View  Result Date: 07/25/2020 CLINICAL DATA:  Chest pain EXAM: CHEST - 2 VIEW COMPARISON:  10/05/2019 FINDINGS: The heart size and mediastinal contours are within normal limits. Both lungs are clear. The visualized skeletal structures are unremarkable. IMPRESSION: No active cardiopulmonary disease.  Electronically Signed   By: Inez Catalina M.D.   On: 07/25/2020 21:28   DG Ankle Complete Left  Result Date: 07/12/2020 CLINICAL DATA:  Ankle pain EXAM: LEFT ANKLE COMPLETE - 3+ VIEW COMPARISON:  None. FINDINGS: No fracture or malalignment. Ankle mortise is symmetric. Moderate plantar calcaneal spur. Mild degenerative spurring medially and laterally IMPRESSION: No acute osseous abnormality.  Minimal degenerative changes Electronically Signed   By: Donavan Foil M.D.   On: 07/12/2020 01:38   CARDIAC CATHETERIZATION  Result Date: 07/26/2020  Ramus lesion is 50% stenosed.  The left ventricular systolic function is normal.  LV end diastolic pressure is normal.  The left ventricular ejection fraction is 55-65% by visual estimate.  There is no aortic valve stenosis.  Diffuse nonobstructive disease.  No culprit lesion for elevated troponin.  Continue preventive  therapy.  D/w Dr. Ellyn Hack.  Possible discharge later today.   ECHOCARDIOGRAM COMPLETE  Result Date: 07/26/2020    ECHOCARDIOGRAM REPORT   Patient Name:   Wesley Schultz Date of Exam: 07/26/2020 Medical Rec #:  [id number]    Height:       72.0 in Accession #:    [id number]   Weight:       177.0 lb Date of Birth:  [birth year]    BSA:          2.023 m Patient Age:    62 years     BP:           134/59 mmHg Patient Gender: M            HR:           52 bpm. Exam Location:  Inpatient Procedure: 2D Echo, Cardiac Doppler and Color Doppler Indications:    R07.9* Chest pain, unspecified  History:        Patient has no prior history of Echocardiogram examinations.                 GERD.  Sonographer:    Jonelle Sidle Dance Referring Phys: [id number] [place]  1. Left ventricular ejection fraction, by estimation, is 55 to 60%. The left ventricle has normal function. The left ventricle has no regional wall motion abnormalities. Left ventricular diastolic parameters were normal.  2. Right ventricular systolic function is normal. The right ventricular size is  normal. There is normal pulmonary artery systolic pressure.  3. Left atrial size was mildly dilated.  4. The mitral valve is normal in structure. Trivial mitral valve regurgitation. No evidence of mitral stenosis.  5. The aortic valve is tricuspid. Aortic valve regurgitation is not visualized. No aortic stenosis is present.  6. The inferior vena cava is normal in size with greater than 50% respiratory variability, suggesting right atrial pressure of 3 mmHg. FINDINGS  Left Ventricle: Left ventricular ejection fraction, by estimation, is 55 to 60%. The left ventricle has normal function. The left ventricle has no regional wall motion abnormalities. The left ventricular internal cavity size was normal in size. There is  no left ventricular hypertrophy. Left ventricular diastolic parameters were normal. Right Ventricle: The right ventricular size is normal. No increase in right ventricular wall thickness. Right ventricular systolic function is normal. There is normal pulmonary artery systolic pressure. The tricuspid regurgitant velocity is 1.53 m/s, and  with an assumed right atrial pressure of 3 mmHg, the estimated right ventricular systolic pressure is 35.3 mmHg. Left Atrium: Left atrial size was mildly dilated. Right Atrium: Right atrial size was normal in size. Pericardium: There is no evidence of pericardial effusion. Mitral Valve: The mitral valve is normal in structure. Trivial mitral valve regurgitation. No evidence of mitral valve stenosis. Tricuspid Valve: The tricuspid valve is normal in structure. Tricuspid valve regurgitation is trivial. No evidence of tricuspid stenosis. Aortic Valve: The aortic valve is tricuspid. Aortic valve regurgitation is not visualized. No aortic stenosis is present. Pulmonic Valve: The pulmonic valve was normal in structure. Pulmonic valve regurgitation is not visualized. No evidence of pulmonic stenosis. Aorta: The aortic root is normal in size and structure. Venous: The inferior  vena cava is normal in size with greater than 50% respiratory variability, suggesting right atrial pressure of 3 mmHg. IAS/Shunts: No atrial level shunt detected by color flow Doppler.  LEFT VENTRICLE PLAX 2D LVIDd:         4.30 cm  Diastology  LVIDs:         3.70 cm  LV e' medial:    9.79 cm/s LV PW:         1.10 cm  LV E/e' medial:  9.1 LV IVS:        0.90 cm  LV e' lateral:   10.40 cm/s LVOT diam:     2.00 cm  LV E/e' lateral: 8.6 LV SV:         72 LV SV Index:   36 LVOT Area:     3.14 cm  RIGHT VENTRICLE             IVC RV Basal diam:  3.20 cm     IVC diam: 1.75 cm RV Mid diam:    2.15 cm RV S prime:     13.40 cm/s TAPSE (M-mode): 2.7 cm LEFT ATRIUM             Index       RIGHT ATRIUM           Index LA diam:        4.00 cm 1.98 cm/m  RA Area:     15.10 cm LA Vol (A2C):   59.5 ml 29.42 ml/m RA Volume:   36.80 ml  18.19 ml/m LA Vol (A4C):   60.2 ml 29.76 ml/m LA Biplane Vol: 60.7 ml 30.01 ml/m  AORTIC VALVE LVOT Vmax:   99.20 cm/s LVOT Vmean:  61.700 cm/s LVOT VTI:    0.229 m  AORTA Ao Root diam: 3.30 cm MITRAL VALVE               TRICUSPID VALVE MV Area (PHT): 2.39 cm    TR Peak grad:   9.4 mmHg MV Decel Time: 317 msec    TR Vmax:        153.00 cm/s MV E velocity: 89.50 cm/s MV A velocity: 81.80 cm/s  SHUNTS MV E/A ratio:  1.09        Systemic VTI:  0.23 m                            Systemic Diam: 2.00 cm Skeet Latch MD Electronically signed by Skeet Latch MD Signature Date/Time: 07/26/2020/6:20:33 PM    Final    VAS [place] LOWER EXTREMITY VENOUS (DVT)  Result Date: 07/18/2020  Lower Venous DVT Study Indications: Pain.  Comparison Study: 02/01/20 negative Performing Technologist: June Leap RDMS, RVT  Examination Guidelines: A complete evaluation includes B-mode imaging, spectral Doppler, color Doppler, and power Doppler as needed of all accessible portions of each vessel. Bilateral testing is considered an integral part of a complete examination. Limited examinations for reoccurring indications  may be performed as noted. The reflux portion of the exam is performed with the patient in reverse Trendelenburg.  +-----+---------------+---------+-----------+----------+--------------+ RIGHTCompressibilityPhasicitySpontaneityPropertiesThrombus Aging +-----+---------------+---------+-----------+----------+--------------+ CFV  Full           Yes      Yes                                 +-----+---------------+---------+-----------+----------+--------------+   +---------+---------------+---------+-----------+----------+--------------+ LEFT     CompressibilityPhasicitySpontaneityPropertiesThrombus Aging +---------+---------------+---------+-----------+----------+--------------+ CFV      Full           Yes      Yes                                 +---------+---------------+---------+-----------+----------+--------------+  SFJ      Full                                                        +---------+---------------+---------+-----------+----------+--------------+ FV Prox  Full                                                        +---------+---------------+---------+-----------+----------+--------------+ FV Mid   Full                                                        +---------+---------------+---------+-----------+----------+--------------+ FV DistalFull                                                        +---------+---------------+---------+-----------+----------+--------------+ PFV      Full                                                        +---------+---------------+---------+-----------+----------+--------------+ POP      Full           Yes      Yes                                 +---------+---------------+---------+-----------+----------+--------------+ PTV      Full                                                        +---------+---------------+---------+-----------+----------+--------------+ PERO     Full                                                         +---------+---------------+---------+-----------+----------+--------------+    Findings reported to [place] at 4:14pm.  Summary: RIGHT: - No evidence of common femoral vein obstruction.  LEFT: - There is no evidence of deep vein thrombosis in the lower extremity.  - No cystic structure found in the popliteal fossa.  *See table(s) above for measurements and observations. Electronically signed by Harold Barban MD on 07/18/2020 at 8:50:34 PM.    Final    Disposition   Pt is being discharged home today in good condition.  Follow-up Plans & Appointments     Follow-up Information    Lorretta Harp, MD Follow up.   Specialties: Cardiology, Radiology Why: The office will  call. Contact information: [address] [place] [address] [phone]              Discharge Instructions    Diet - low sodium heart healthy   Complete by: As directed    Increase activity slowly   Complete by: As directed    Increase activity slowly   Complete by: As directed       Discharge Medications   Allergies as of 07/26/2020      Reactions   Codeine Nausea Only      Medication List    TAKE these medications   amLODipine 2.5 MG tablet Commonly known as: NORVASC Take 1 tablet (2.5 mg total) by mouth daily.   aspirin 81 MG tablet Take 81 mg by mouth daily.   atorvastatin 40 MG tablet Commonly known as: LIPITOR Take 1 tablet (40 mg total) by mouth daily.   cephALEXin 500 MG capsule Commonly known as: KEFLEX Take 500 mg by mouth 4 (four) times daily.   clopidogrel 75 MG tablet Commonly known as: Plavix Take 1 tablet (75 mg total) by mouth daily.   doxycycline 100 MG tablet Commonly known as: VIBRA-TABS Take 100 mg by mouth 3 (three) times daily.   fish oil-omega-3 fatty acids 1000 MG capsule Take 1 g by mouth daily.   nitroGLYCERIN 0.4 MG SL tablet Commonly known as: Nitrostat Place 1 tablet (0.4 mg total) under the  tongue every 5 (five) minutes as needed for chest pain.          Outstanding Labs/Studies   None  Duration of Discharge Encounter   Greater than 30 minutes including physician time.  Signed, Rosaria Ferries, PA-C 07/26/2020, 7:55 PM

## 2020-07-26 NOTE — Final Progress Note (Signed)
Patient discharge instructions given. Patient and wife acknowledge instructions with verbal teach back. Patient is being discharged with all personal belongings and VS stable. Wife transporting patient via personal vehicle.

## 2020-07-26 NOTE — ED Notes (Signed)
ECHO in progress- 

## 2020-07-26 NOTE — Progress Notes (Signed)
[place] for Heparin Indication: chest pain /ACS  Allergies  Allergen Reactions  . Codeine Nausea Only    Patient Measurements: Height: 6' (182.9 cm) Weight: 90 kg (198 lb 6.6 oz) IBW/kg (Calculated) : 77.6  Vital Signs: Temp: 97.9 F (36.6 C) (03/28 2311) Temp Source: Oral (03/28 2311) BP: 125/71 (03/29 0515) Pulse Rate: 51 (03/29 0515)  Labs: Recent Labs    07/25/20 1619 07/25/20 2105 07/25/20 2302 07/26/20 0326  HGB 16.3 16.1  --  15.4  HCT 48.4 50.0  --  47.3  PLT 284 280  --  247  LABPROT  --   --   --  13.7  INR  --   --   --  1.1  CREATININE 1.08 1.19  --  0.87  TROPONINIHS  --  341* 324* 266*    Estimated Creatinine Clearance: 89.2 mL/min (by C-G formula based on SCr of 0.87 mg/dL).   Medical History: Past Medical History:  Diagnosis Date  . Arthritis   . Asthma   . Colon polyps   . GERD (gastroesophageal reflux disease)   . Primary localized osteoarthritis of left knee 10/05/2019    Medications:  No current facility-administered medications on file prior to encounter.   Current Outpatient Medications on File Prior to Encounter  Medication Sig Dispense Refill  . aspirin 81 MG tablet Take 81 mg by mouth daily.    Marland Kitchen atorvastatin (LIPITOR) 40 MG tablet Take 1 tablet (40 mg total) by mouth daily. 90 tablet 3  . cephALEXin (KEFLEX) 500 MG capsule Take 500 mg by mouth 4 (four) times daily.    Marland Kitchen doxycycline (VIBRA-TABS) 100 MG tablet Take 100 mg by mouth 3 (three) times daily.    . fish oil-omega-3 fatty acids 1000 MG capsule Take 1 g by mouth daily.     . nitroGLYCERIN (NITROSTAT) 0.4 MG SL tablet Place 1 tablet (0.4 mg total) under the tongue every 5 (five) minutes as needed for chest pain. 30 tablet 3     Assessment: 70 y.o. male with chest pain for heparin.  Heparin level at goal this morning. No bleeding or infusion issues noted.   Planning cath today.  Goal of Therapy:  Heparin level 0.3-0.7  units/ml Monitor platelets by anticoagulation protocol: Yes   Plan:  Continue heparin at 1100 units/hr F/up after cath  Erin Hearing PharmD., BCPS Clinical Pharmacist 07/26/2020 8:02 AM

## 2020-07-26 NOTE — Progress Notes (Signed)
  Echocardiogram 2D Echocardiogram has been performed.  Randa Lynn Azaria Bartell 07/26/2020, 3:59 PM

## 2020-07-26 NOTE — Progress Notes (Signed)
Progress Note  Patient Name: Wesley Schultz Date of Encounter: 07/26/2020  [facility] Cardiologist: Quay Burow, MD ; Roby Lofts, PA   Patient Profile     70 y.o. male  with history of osteoarthritis, GERD, asthma and longstanding tobacco abuse who was seen by Roby Lofts in clinic on 07/25/2020 as a work in visit to evaluate an episode of chest pain lasting about 2 hours on 5 AM the morning of Sunday, 07/24/2020.  He had a pressure-like nonradiating chest pain lasted about 2 hours and woke him up from sleep.  There is no associated nausea vomiting diaphoresis or dyspnea.  No PND or orthopnea, but has had mild lower extremity edema since his knee surgeries.  He has had a prior borderline positive stress test in July 2021 is reviewed below.  Following his visit, he had troponin levels checked stat for discussion with Dr. Stanford Breed.  Plan was as follows: If negative, pursue coronary CTA, if positive, patient be contacted to present to the ER for admission for non-STEMI and urgent catheterization. => Shortly after arriving home, he was contacted that his troponin levels were elevated at 300 and he was instructed to come to the ER.  He arrived here yesterday at roughly 7 PM.  Was seen by Dr. Paticia Stack at roughly midnight.  At that time he is having no further chest pain.  EKG had nonspecific ST changes with poor R wave progression in anterior leads.  He was started on IV heparin, loaded with aspirin and given 81 mg daily along with atorvastatin 40 mg daily.  He has not required as needed nitroglycerin. 2D echo ordered hospital  Subjective   No further chest pain.  Just hungry.  Tired of lying in the ER.  Baseline wheezing. Mild left ankle swelling.  Has had bilateral swelling since his knee surgeries.  Inpatient Medications    Scheduled Meds: . [START ON 07/27/2020] aspirin EC  81 mg Oral Daily  . atorvastatin  40 mg Oral Daily   Continuous Infusions: . sodium chloride 1 mL/kg/hr  (07/26/20 1001)  . heparin 1,100 Units/hr (07/26/20 0011)   PRN Meds: acetaminophen, albuterol, ipratropium, nitroGLYCERIN, ondansetron (ZOFRAN) IV   Vital Signs    Vitals:   07/26/20 0823 07/26/20 1000 07/26/20 1100 07/26/20 1230  BP:  124/67 138/65 (!) 134/59  Pulse:  (!) 49 (!) 51 (!) 48  Resp:  19 20 16   Temp:    98.1 F (36.7 C)  TempSrc:    Oral  SpO2:  95% 96% 96%  Weight: 80.3 kg     Height: 6' (1.829 m)       Intake/Output Summary (Last 24 hours) at 07/26/2020 1357 Last data filed at 07/26/2020 1002 Gross per 24 hour  Intake 369 ml  Output 300 ml  Net 69 ml   Last 3 Weights 07/26/2020 07/25/2020 07/25/2020  Weight (lbs) 177 lb 198 lb 6.6 oz 177 lb  Weight (kg) 80.287 kg 90 kg 80.287 kg      Telemetry    Sinus rhythm- Personally Reviewed  ECG    EKG from yesterday showed heart rates 57 bpm.  Sinus rhythm.  Borderline right axis deviation.  Cannot exclude septal infarct, age-indeterminate.- Personally Reviewed  No new study for today.  Physical Exam   GEN: No acute distress.  Resting comfortably in bed.  Just getting bored. Neck: No JVD, or bruit. Cardiac: RRR, no murmurs, rubs, or gallops.  Bounding radial pulses bilaterally.  Strong femoral pulses with bilateral  bruits. Respiratory:  Mild diffuse crackles on exam with end-expiratory wheeze.  Nonlabored GI: Soft, nontender, non-distended.  NABS MS:  Trace to 1+ left ankle swelling, but otherwise no clubbing or cyanosis.  No true edema. Neuro:  Nonfocal ; CN II-XII grossly intact. Psych: Normal affect   Labs    High Sensitivity Troponin:   Recent Labs  Lab 07/25/20 2105 07/25/20 2302 07/26/20 0326 07/26/20 0731  TROPONINIHS 341* 324* 266* 255*      Chemistry Recent Labs  Lab 07/25/20 1619 07/25/20 2105 07/26/20 0326  NA 138 139 137  K 4.9 4.3 4.4  CL 100 103 105  CO2 23 29 25   GLUCOSE 100* 109* 108*  BUN 25 25* 27*  CREATININE 1.08 1.19 0.87  CALCIUM 9.4 9.0 8.6*  GFRNONAA  --  >60 >60   ANIONGAP  --  7 7    Lab Results  Component Value Date   CHOL 131 07/26/2020   HDL 43 07/26/2020   LDLCALC 81 07/26/2020   TRIG 36 07/26/2020   CHOLHDL 3.0 07/26/2020    Hematology Recent Labs  Lab 07/25/20 1619 07/25/20 2105 07/26/20 0326  WBC 12.9* 12.5* 10.8*  RBC 5.62 5.50 5.19  HGB 16.3 16.1 15.4  HCT 48.4 50.0 47.3  MCV 86 90.9 91.1  MCH 29.0 29.3 29.7  MCHC 33.7 32.2 32.6  RDW 12.7 13.5 13.7  PLT 284 280 247    BNPNo results for input(s): BNP, PROBNP in the last 168 hours.   DDimer No results for input(s): DDIMER in the last 168 hours.   Radiology    DG Chest 2 View  Result Date: 07/25/2020 CLINICAL DATA:  Chest pain EXAM: CHEST - 2 VIEW COMPARISON:  10/05/2019 FINDINGS: The heart size and mediastinal contours are within normal limits. Both lungs are clear. The visualized skeletal structures are unremarkable. IMPRESSION: No active cardiopulmonary disease. Electronically Signed   By: Inez Catalina M.D.   On: 07/25/2020 21:28    Cardiac Studies   He was initially evaluated in July 2021 with a Myoview stress test for stratification prior to knee surgery.  This showed EF of 47% with a medium size defect of moderate severity in the mid inferior, apical inferior and apica walll consistent with ischemia.  There is also considered possibility of being diaphragmatic attenuation.  He was cleared for surgery and did well with the surgery.   Assessment & Plan    Principal Problem:   NSTEMI (non-ST elevated myocardial infarction) ([place]); delayed presentation Active Problems:   Smoker   Hyperlipidemia LDL goal <70  Delayed presentation of non-STEMI.  No pain since Sunday after long episode.  I suspect he may have a subtotally occluded vessel.  Plan is for cardiac catheterization today.   2D echo pending. Remains on IV heparin; was loaded with aspirin 3.4 mg now on 81 mg daily. On aspirin and statin at 40 mg grams daily -> fasting lipid panel ordered; LDL 80.  Monitor  blood pressures, consider beta-1 selective to beta-blocker plus/minus ARB pending results of cath  Performing MD: Dr. Martinique or Dr. Irish Lack  Procedure: LEFT HEART CATHETERIZATION WITH CORONARY ANGIOGRAPHY AND POSSIBLE PERCUTANEOUS CORONARY INTERVENTION  The procedure with Risks/Benefits/Alternatives and Indications was reviewed with the patient.  All questions were answered.    Risks / Complications include, but not limited to: Death, MI, CVA/TIA, VF/VT (with defibrillation), Bradycardia (need for temporary pacer placement), contrast induced nephropathy, bleeding / bruising / hematoma / pseudoaneurysm, vascular or coronary injury (with possible emergent CT  or Vascular Surgery), adverse medication reactions, infection.  Additional risks involving the use of radiation with the possibility of radiation burns and cancer were explained in detail.  The patient voices understanding and agree to proceed.      For questions or updates, please contact [place] Please consult [email or web address] for contact info under        Signed, Glenetta Hew, MD  07/26/2020, 1:57 PM

## 2020-07-27 ENCOUNTER — Encounter (HOSPITAL_COMMUNITY): Payer: Self-pay | Admitting: Interventional Cardiology

## 2020-07-29 ENCOUNTER — Encounter (HOSPITAL_COMMUNITY): Payer: Self-pay

## 2020-07-29 ENCOUNTER — Ambulatory Visit (HOSPITAL_COMMUNITY): Admit: 2020-07-29 | Payer: Medicare Other | Admitting: Interventional Cardiology

## 2020-07-29 SURGERY — LEFT HEART CATH AND CORONARY ANGIOGRAPHY
Anesthesia: LOCAL

## 2020-07-29 NOTE — Telephone Encounter (Signed)
Patient was seen in office, had heart cath- discharged on 03/29, no follow up appointment scheduled.  Will route to scheduling team to have follow up with Dr.Berry or APP.   Thank you!

## 2020-08-08 DIAGNOSIS — M170 Bilateral primary osteoarthritis of knee: Secondary | ICD-10-CM | POA: Diagnosis not present

## 2020-08-24 ENCOUNTER — Other Ambulatory Visit: Payer: Self-pay

## 2020-08-24 ENCOUNTER — Encounter: Payer: Self-pay | Admitting: Cardiovascular Disease

## 2020-08-24 ENCOUNTER — Ambulatory Visit (INDEPENDENT_AMBULATORY_CARE_PROVIDER_SITE_OTHER): Payer: Medicare Other | Admitting: Cardiovascular Disease

## 2020-08-24 DIAGNOSIS — F17200 Nicotine dependence, unspecified, uncomplicated: Secondary | ICD-10-CM

## 2020-08-24 DIAGNOSIS — I214 Non-ST elevation (NSTEMI) myocardial infarction: Secondary | ICD-10-CM | POA: Diagnosis not present

## 2020-08-24 DIAGNOSIS — E785 Hyperlipidemia, unspecified: Secondary | ICD-10-CM

## 2020-08-24 NOTE — Assessment & Plan Note (Signed)
History of hyperlipidemia on statin therapy with lipid profile performed 07/26/2020 revealing total cholesterol 131, LDL of 81 and HDL 43.

## 2020-08-24 NOTE — Patient Instructions (Signed)
Medication Instructions:  Your physician recommends that you continue on your current medications as directed. Please refer to the Current Medication list given to you today.  *If you need a refill on your cardiac medications before your next appointment, please call your pharmacy*   Follow-Up: At [facility], you and your health needs are our priority.  As part of our continuing mission to provide you with exceptional heart care, we have created designated Provider Care Teams.  These Care Teams include your primary Cardiologist (physician) and Advanced Practice Providers (APPs -  Physician Assistants and Nurse Practitioners) who all work together to provide you with the care you need, when you need it.  We recommend signing up for the patient portal called "MyChart".  Sign up information is provided on this After Visit Summary.  MyChart is used to connect with patients for Virtual Visits (Telemedicine).  Patients are able to view lab/test results, encounter notes, upcoming appointments, etc.  Non-urgent messages can be sent to your provider as well.   To learn more about what you can do with MyChart, go to [email or web address].    Your next appointment:   6 month(s)  The format for your next appointment:   In Person  Provider:   You will see one of the following Advanced Practice Providers on your designated Care Team:     Roby Lofts, PA-C  Then, Quay Burow, MD will plan to see you again in 12 month(s).

## 2020-08-24 NOTE — Assessment & Plan Note (Signed)
History of ongoing tobacco abuse recalcitrant to risk factor modification. 

## 2020-08-24 NOTE — Progress Notes (Signed)
08/24/2020 Wesley Schultz   [birth year]  [id number]  Primary Physician Sharion Balloon, FNP Primary Cardiologist: Lorretta Harp MD [name], [place]  HPI:  Wesley Schultz is a 70 y.o.  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  He was referred by Dr. Noemi Chapel for preoperative clearance before elective total knee replacement.  I last saw him in the office 12/15/2019. His risk factors include over 50 pack years of tobacco abuse continue to smoke 1 pack/day.  Does have a family history of heart disease the father had a myocardial infarction in his early 11s.  Is never had a heart attack or stroke.  Does have COPD.  Also complains of atypical chest pain occurring every other week.  He has had 3 back surgeries in the past.  He had a Myoview stress test which was low risk and I cleared him for his orthopedic surgery which Dr. Noemi Chapel performed without incident.  He was admitted to the hospital 07/25/2020 with chest pain.  His troponins rose to 300.  He underwent coronary angiography by Dr. Ellyn Hack revealing an ostial ramus branch lesion thought not to be the culprit and he was treated presumptively for coronary vasospasm with low-dose amlodipine.  He said no recurrent symptoms.  He does continue to smoke.   Current Meds  Medication Sig  . amLODipine (NORVASC) 2.5 MG tablet Take 1 tablet (2.5 mg total) by mouth daily.  Marland Kitchen aspirin 81 MG tablet Take 81 mg by mouth daily.  Marland Kitchen atorvastatin (LIPITOR) 40 MG tablet Take 1 tablet (40 mg total) by mouth daily.  . cephALEXin (KEFLEX) 500 MG capsule Take 500 mg by mouth 4 (four) times daily.  . clopidogrel (PLAVIX) 75 MG tablet Take 1 tablet (75 mg total) by mouth daily.  Marland Kitchen doxycycline (VIBRA-TABS) 100 MG tablet Take 100 mg by mouth 3 (three) times daily.  . fish oil-omega-3 fatty acids 1000 MG capsule Take 1 g by mouth daily.   . nitroGLYCERIN (NITROSTAT) 0.4 MG SL tablet Place 1 tablet (0.4 mg total)  under the tongue every 5 (five) minutes as needed for chest pain.     Allergies  Allergen Reactions  . Codeine Nausea Only    Social History   Socioeconomic History  . Marital status: Married    Spouse name: Not on file  . Number of children: Not on file  . Years of education: Not on file  . Highest education level: Not on file  Occupational History  . Not on file  Tobacco Use  . Smoking status: Current Every Day Smoker    Packs/day: 1.00    Years: 40.00    Pack years: 40.00    Types: Cigarettes  . Smokeless tobacco: Never Used  Substance and Sexual Activity  . Alcohol use: No  . Drug use: No  . Sexual activity: Never  Other Topics Concern  . Not on file  Social History Narrative  . Not on file   Social Determinants of Health   Financial Resource Strain: Not on file  Food Insecurity: Not on file  Transportation Needs: Not on file  Physical Activity: Not on file  Stress: Not on file  Social Connections: Not on file  Intimate Partner Violence: Not on file     Review of Systems: General: negative for chills, fever, night sweats or weight changes.  Cardiovascular: negative for chest pain, dyspnea on exertion, edema, orthopnea, palpitations, paroxysmal nocturnal dyspnea  or shortness of breath Dermatological: negative for rash Respiratory: negative for cough or wheezing Urologic: negative for hematuria Abdominal: negative for nausea, vomiting, diarrhea, bright red blood per rectum, melena, or hematemesis Neurologic: negative for visual changes, syncope, or dizziness All other systems reviewed and are otherwise negative except as noted above.    Blood pressure (!) 120/58, pulse 71, height 6' (1.829 m), weight 170 lb (77.1 kg).  General appearance: alert and no distress Neck: no adenopathy, no carotid bruit, no JVD, supple, symmetrical, trachea midline and thyroid not enlarged, symmetric, no tenderness/mass/nodules Lungs: clear to auscultation bilaterally Heart:  regular rate and rhythm, S1, S2 normal, no murmur, click, rub or gallop Extremities: extremities normal, atraumatic, no cyanosis or edema Pulses: 2+ and symmetric Skin: Skin color, texture, turgor normal. No rashes or lesions Neurologic: Alert and oriented X 3, normal strength and tone. Normal symmetric reflexes. Normal coordination and gait  EKG not performed today  ASSESSMENT AND PLAN:   Smoker History of ongoing tobacco abuse recalcitrant to risk factor modification.  Hyperlipidemia LDL goal <70 History of hyperlipidemia on statin therapy with lipid profile performed 07/26/2020 revealing total cholesterol 131, LDL of 81 and HDL 43.  NSTEMI (non-ST elevated myocardial infarction) ([place]); delayed presentation History of recent non-STEMI with a troponin that rose to 300.  He had diagnostic coronary angiography by Dr. Ellyn Hack revealing an ostial ramus branch lesion not felt to be "the culprit vessel".  He was placed on low-dose amlodipine because of presumptive coronary spasm.      Lorretta Harp MD FACP,FACC,FAHA, [facility] 08/24/2020 2:45 PM

## 2020-08-24 NOTE — Assessment & Plan Note (Signed)
History of recent non-STEMI with a troponin that rose to 300.  He had diagnostic coronary angiography by Dr. Ellyn Hack revealing an ostial ramus branch lesion not felt to be "the culprit vessel".  He was placed on low-dose amlodipine because of presumptive coronary spasm.

## 2020-09-05 DIAGNOSIS — M25572 Pain in left ankle and joints of left foot: Secondary | ICD-10-CM | POA: Diagnosis not present

## 2020-09-10 DIAGNOSIS — M25572 Pain in left ankle and joints of left foot: Secondary | ICD-10-CM | POA: Diagnosis not present

## 2021-01-30 ENCOUNTER — Telehealth: Payer: Self-pay | Admitting: Family

## 2021-01-30 NOTE — Telephone Encounter (Signed)
Left message for patient to call back and schedule Medicare Annual Wellness Visit (AWV) to be completed by video or phone.   Last AWV:  01/06/2015  Please schedule at anytime with [facility] Health Advisor.  45 minute appointment  Any questions, please contact me at [phone]

## 2021-01-30 NOTE — Telephone Encounter (Signed)
Pt does not want to schedule AWV

## 2021-02-22 ENCOUNTER — Other Ambulatory Visit: Payer: Self-pay

## 2021-02-22 ENCOUNTER — Encounter: Payer: Self-pay | Admitting: Medical

## 2021-02-22 ENCOUNTER — Ambulatory Visit (INDEPENDENT_AMBULATORY_CARE_PROVIDER_SITE_OTHER): Payer: Medicare Other | Admitting: Medical

## 2021-02-22 VITALS — BP 100/54 | HR 72 | Ht 72.0 in | Wt 168.0 lb

## 2021-02-22 DIAGNOSIS — I2510 Atherosclerotic heart disease of native coronary artery without angina pectoris: Secondary | ICD-10-CM

## 2021-02-22 DIAGNOSIS — Z720 Tobacco use: Secondary | ICD-10-CM

## 2021-02-22 DIAGNOSIS — R600 Localized edema: Secondary | ICD-10-CM | POA: Diagnosis not present

## 2021-02-22 DIAGNOSIS — E785 Hyperlipidemia, unspecified: Secondary | ICD-10-CM | POA: Diagnosis not present

## 2021-02-22 DIAGNOSIS — J449 Chronic obstructive pulmonary disease, unspecified: Secondary | ICD-10-CM | POA: Diagnosis not present

## 2021-02-22 DIAGNOSIS — I10 Essential (primary) hypertension: Secondary | ICD-10-CM

## 2021-02-22 MED ORDER — FUROSEMIDE 20 MG PO TABS: 20.0000 mg | ORAL_TABLET | ORAL | 2 refills | Status: DC | PRN

## 2021-02-22 NOTE — Patient Instructions (Signed)
Medication Instructions:  START- Furosemide 20 mg by mouth daily for 3 days, then take as needed  *If you need a refill on your cardiac medications before your next appointment, please call your pharmacy*   Lab Work: CMP and Fasting Lipid  If you have labs (blood work) drawn today and your tests are completely normal, you will receive your results only by: [place] (if you have MyChart) OR A paper copy in the mail If you have any lab test that is abnormal or we need to change your treatment, we will call you to review the results.   Testing/Procedures: None Ordered   Follow-Up: At [facility], you and your health needs are our priority.  As part of our continuing mission to provide you with exceptional heart care, we have created designated Provider Care Teams.  These Care Teams include your primary Cardiologist (physician) and Advanced Practice Providers (APPs -  Physician Assistants and Nurse Practitioners) who all work together to provide you with the care you need, when you need it.  We recommend signing up for the patient portal called "MyChart".  Sign up information is provided on this After Visit Summary.  MyChart is used to connect with patients for Virtual Visits (Telemedicine).  Patients are able to view lab/test results, encounter notes, upcoming appointments, etc.  Non-urgent messages can be sent to your provider as well.   To learn more about what you can do with MyChart, go to [email or web address].    Your next appointment:   6 month(s)  The format for your next appointment:   In Person  Provider:   You may see Quay Burow, MD or one of the following Advanced Practice Providers on your designated Care Team:   [place], PA-C Coletta Memos, FNP   Other Instructions You have been referred to Pulmonology

## 2021-02-22 NOTE — Progress Notes (Signed)
Cardiology Office Note   Date:  02/22/2021   ID:  JSOEPH PODESTA, DOB 10-20-1950, MRN [id number]  PCP:  Sharion Balloon, FNP  Cardiologist:  Quay Burow, MD EP: None  Chief Complaint  Patient presents with   Follow-up    CAD, HTN   Edema      History of Present Illness: Wesley Schultz is a 70 y.o. male with a PMH of nonobstructive CAD, HTN, HLD, GERD, asthma, tobacco abuse who presents for 65-month follow-up.  He was last evaluated by cardiology at an outpatient visit with Dr. Gwenlyn Found 07/2020 following a recent admission to the hospital for chest pain with NSTEMI (HsTrop peaked in 300s).  He underwent left heart cath 06/2020 which showed diffuse nonobstructive disease, recommended for ongoing aggressive risk factor modification.  Echocardiogram 06/2020 showed EF 55-60%, normal LV diastolic function, no R WMA, mild LAE, and no significant valvular abnormalities.  He was started on amlodipine for possible coronary spasm.  No medication changes occurred at his visit with Dr. Gwenlyn Found and he was recommended to follow-up in 6 months.  He presents today for routine follow-up. His primary concern is swelling in his legs. Generally worse throughout the day. He does report eating quite a bit of salt - had [employer] prior to today's visit. He has not had orthopnea or PND. He reports breathing is at baseline - longstanding DOE history with his asthma and ongoing tobacco use. He has not had previous lung cancer screening. He has no complaints of chest pain, palpitations, dizziness, lightheadedness, or syncope. He has been having shoulder pain but has not seen an orthopedist     Past Medical History:  Diagnosis Date   Arthritis    Asthma    Colon polyps    GERD (gastroesophageal reflux disease)    Primary localized osteoarthritis of left knee 10/05/2019    Past Surgical History:  Procedure Laterality Date   BACK SURGERY     2008, 2009, 2010   CATARACT EXTRACTION, BILATERAL     CERVICAL SPINE  SURGERY     HERNIA REPAIR     LEFT HEART CATH AND CORONARY ANGIOGRAPHY N/A 07/26/2020   Procedure: LEFT HEART CATH AND CORONARY ANGIOGRAPHY;  Surgeon: Jettie Booze, MD;  Location: [facility];  Service: Cardiovascular;  Laterality: N/A;   LUMBAR LAMINECTOMY       Current Outpatient Medications  Medication Sig Dispense Refill   amLODipine (NORVASC) 2.5 MG tablet Take 1 tablet (2.5 mg total) by mouth daily. 30 tablet 11   aspirin 81 MG tablet Take 81 mg by mouth daily.     cephALEXin (KEFLEX) 500 MG capsule Take 500 mg by mouth 4 (four) times daily.     clopidogrel (PLAVIX) 75 MG tablet Take 1 tablet (75 mg total) by mouth daily. 30 tablet 11   doxycycline (VIBRA-TABS) 100 MG tablet Take 100 mg by mouth 3 (three) times daily.     fish oil-omega-3 fatty acids 1000 MG capsule Take 1 g by mouth daily.      furosemide (LASIX) 20 MG tablet Take 1 tablet (20 mg total) by mouth as needed. 30 tablet 2   nitroGLYCERIN (NITROSTAT) 0.4 MG SL tablet Place 1 tablet (0.4 mg total) under the tongue every 5 (five) minutes as needed for chest pain. 30 tablet 3   atorvastatin (LIPITOR) 40 MG tablet Take 1 tablet (40 mg total) by mouth daily. 90 tablet 3   No current facility-administered medications for this visit.  Allergies:   Codeine    Social History:  The patient  reports that he has been smoking cigarettes. He has a 40.00 pack-year smoking history. He has never used smokeless tobacco. He reports that he does not drink alcohol and does not use drugs.   Family History:  The patient's family history includes Colon cancer (age of onset: 49) in his sister; Diabetes in his mother; Heart disease in his father; Seizures in his mother.    ROS:  Please see the history of present illness.   Otherwise, review of systems are positive for none.   All other systems are reviewed and negative.    PHYSICAL EXAM: VS:  BP (!) 100/54   Pulse 72   Ht 6' (1.829 m)   Wt 168 lb (76.2 kg)   SpO2 93%    BMI 22.78 kg/m  , BMI Body mass index is 22.78 kg/m. GEN: Well nourished, well developed, in no acute distress HEENT: sclera anicteric Neck: no JVD, carotid bruits, or masses Cardiac: RRR; no murmurs, rubs, or gallops, trace-1+ LE edema  Respiratory:  clear to auscultation bilaterally, normal work of breathing GI: soft, nontender, nondistended, + BS MS: no deformity or atrophy Skin: warm and dry, no rash Neuro:  Strength and sensation are intact Psych: euthymic mood, full affect   EKG:  EKG is not ordered today.   Recent Labs: 07/26/2020: BUN 27; Creatinine, Ser 0.87; Hemoglobin 15.4; Platelets 247; Potassium 4.4; Sodium 137    Lipid Panel    Component Value Date/Time   CHOL 131 07/26/2020 0326   CHOL 186 10/05/2019 1501   TRIG 36 07/26/2020 0326   HDL 43 07/26/2020 0326   HDL 45 10/05/2019 1501   CHOLHDL 3.0 07/26/2020 0326   VLDL 7 07/26/2020 0326   LDLCALC 81 07/26/2020 0326   LDLCALC 103 (H) 10/05/2019 1501      Wt Readings from Last 3 Encounters:  02/22/21 168 lb (76.2 kg)  08/24/20 170 lb (77.1 kg)  07/26/20 177 lb (80.3 kg)      Other studies Reviewed: Additional studies/ records that were reviewed today include:   Cardiac Cath: 07/26/2020 Ramus lesion is 50% stenosed. The left ventricular systolic function is normal. LV end diastolic pressure is normal. The left ventricular ejection fraction is 55-65% by visual estimate. There is no aortic valve stenosis.   Diffuse nonobstructive disease.  No culprit lesion for elevated troponin.  Continue preventive therapy.     D/w Dr. Ellyn Hack.  Possible discharge later today.   Diagnostic Dominance: Left    ECHO: 07/26/2020  1. Left ventricular ejection fraction, by estimation, is 55 to 60%. The  left ventricle has normal function. The left ventricle has no regional  wall motion abnormalities. Left ventricular diastolic parameters were  normal.   2. Right ventricular systolic function is normal. The right  ventricular  size is normal. There is normal pulmonary artery systolic pressure.   3. Left atrial size was mildly dilated.   4. The mitral valve is normal in structure. Trivial mitral valve  regurgitation. No evidence of mitral stenosis.   5. The aortic valve is tricuspid. Aortic valve regurgitation is not  visualized. No aortic stenosis is present.   6. The inferior vena cava is normal in size with greater than 50%  respiratory variability, suggesting right atrial pressure of 3 mmHg.     ASSESSMENT AND PLAN:  1. Non-obstructive CAD: no anginal complaints.  - Continue aspirin, plavix, and statin - suspect plavix can be discontinued  at the 1 year mark post NSTEMI (06/2021 - Continue amlodipine for possible vasospasm   - Continue risk factor modification for goal BP < 130/80, LDL <70, and A1c <7   2. LE edema: has had swelling recently but no orthopnea/PND. Weights have been stable.  - Will give po lasix $Remove'20mg'GEMzFyn$  daily x3 days then prn going forward - Encouraged low sodium diet and monitoring of weights.   3. HTN: BP 100/52 today. No dizziness or lightheadedness - Continue amlodipine  4. HLD: LDL 81 06/2020 - Will have him return for FLP/LFTs at his convenience - Continue atorvastatin   5. Tobacco abuse: longstanding history - 50+ years smoking at least 1ppd  - Will place referral to pulmonology for COPD evaluation and lung cancer screening.  - Continue to encourage cessation     Current medicines are reviewed at length with the patient today.  The patient does not have concerns regarding medicines.  The following changes have been made:  As above  Labs/ tests ordered today include:   Orders Placed This Encounter  Procedures   Comp Met (CMET)   Lipid panel   Ambulatory referral to Pulmonology     Disposition:  FU with Dr. Gwenlyn Found in 6 months  Signed, Abigail Butts, PA-C  02/22/2021 3:59 PM

## 2021-03-01 DIAGNOSIS — I10 Essential (primary) hypertension: Secondary | ICD-10-CM | POA: Diagnosis not present

## 2021-03-01 DIAGNOSIS — E785 Hyperlipidemia, unspecified: Secondary | ICD-10-CM | POA: Diagnosis not present

## 2021-03-01 DIAGNOSIS — I2510 Atherosclerotic heart disease of native coronary artery without angina pectoris: Secondary | ICD-10-CM | POA: Diagnosis not present

## 2021-03-01 LAB — COMPREHENSIVE METABOLIC PANEL
ALT: 16 IU/L (ref 0–44)
AST: 19 IU/L (ref 0–40)
Albumin/Globulin Ratio: 2 (ref 1.2–2.2)
Albumin: 4.5 g/dL (ref 3.8–4.8)
Alkaline Phosphatase: 118 IU/L (ref 44–121)
BUN/Creatinine Ratio: 20 (ref 10–24)
BUN: 19 mg/dL (ref 8–27)
Bilirubin Total: 0.5 mg/dL (ref 0.0–1.2)
CO2: 27 mmol/L (ref 20–29)
Calcium: 9.8 mg/dL (ref 8.6–10.2)
Chloride: 98 mmol/L (ref 96–106)
Creatinine, Ser: 0.93 mg/dL (ref 0.76–1.27)
Globulin, Total: 2.2 g/dL (ref 1.5–4.5)
Glucose: 87 mg/dL (ref 70–99)
Potassium: 4.3 mmol/L (ref 3.5–5.2)
Sodium: 139 mmol/L (ref 134–144)
Total Protein: 6.7 g/dL (ref 6.0–8.5)
eGFR: 89 mL/min/{1.73_m2} (ref >59)

## 2021-03-01 LAB — LIPID PANEL
Chol/HDL Ratio: 2.6 ratio (ref 0.0–5.0)
Cholesterol, Total: 116 mg/dL (ref 100–199)
HDL: 44 mg/dL (ref >39)
LDL Chol Calc (NIH): 58 mg/dL (ref 0–99)
Triglycerides: 64 mg/dL (ref 0–149)
VLDL Cholesterol Cal: 14 mg/dL (ref 5–40)

## 2021-03-03 ENCOUNTER — Encounter: Payer: Self-pay | Admitting: Family Medicine

## 2021-03-03 ENCOUNTER — Ambulatory Visit (INDEPENDENT_AMBULATORY_CARE_PROVIDER_SITE_OTHER): Payer: Medicare Other

## 2021-03-03 ENCOUNTER — Ambulatory Visit (INDEPENDENT_AMBULATORY_CARE_PROVIDER_SITE_OTHER): Payer: Medicare Other | Admitting: Family Medicine

## 2021-03-03 VITALS — BP 125/78 | HR 79 | Temp 97.9°F | Ht 72.0 in | Wt 168.0 lb

## 2021-03-03 DIAGNOSIS — R058 Other specified cough: Secondary | ICD-10-CM | POA: Diagnosis not present

## 2021-03-03 DIAGNOSIS — J208 Acute bronchitis due to other specified organisms: Secondary | ICD-10-CM | POA: Diagnosis not present

## 2021-03-03 DIAGNOSIS — R059 Cough, unspecified: Secondary | ICD-10-CM | POA: Diagnosis not present

## 2021-03-03 MED ORDER — PREDNISONE 20 MG PO TABS: 40.0000 mg | ORAL_TABLET | Freq: Every day | ORAL | 0 refills | Status: AC

## 2021-03-03 MED ORDER — GUAIFENESIN ER 600 MG PO TB12: 600.0000 mg | ORAL_TABLET | Freq: Two times a day (BID) | ORAL | 0 refills | Status: AC

## 2021-03-03 MED ORDER — ALBUTEROL SULFATE HFA 108 (90 BASE) MCG/ACT IN AERS: 2.0000 | INHALATION_SPRAY | Freq: Four times a day (QID) | RESPIRATORY_TRACT | 2 refills | Status: DC | PRN

## 2021-03-03 NOTE — Progress Notes (Signed)
Subjective:  Patient ID: Wesley Schultz, male    DOB: 1950/08/01, 70 y.o.   MRN: [id number]  Patient Care Team: Sharion Balloon, FNP as PCP - General (Nurse Practitioner) Lorretta Harp, MD as PCP - Cardiology (Cardiology) Dettinger, Fransisca Kaufmann, MD as Consulting Physician (Family Medicine)   Chief Complaint:  Cough (Cough - causes right side chest  and backside under shoulder blade ache//Phlegm//No headache, fever)   HPI: Wesley Schultz is a 70 y.o. male presenting on 03/03/2021 for Cough (Cough - causes right side chest  and backside under shoulder blade ache//Phlegm//No headache, fever)   Patient presents today with complaints of cough which causes right-sided pleuritic chest pain and pain in his right shoulder blade.  Denies other associated symptoms other than clear sputum production.  No known sick contacts.   Cough This is a new problem. The current episode started in the past 7 days. The problem has been waxing and waning. The cough is Productive of sputum. Associated symptoms include chest pain. Pertinent negatives include no chills, ear congestion, ear pain, fever, headaches, heartburn, hemoptysis, myalgias, nasal congestion, postnasal drip, rash, rhinorrhea, sore throat, shortness of breath, sweats, weight loss or wheezing. Nothing aggravates the symptoms. He has tried nothing for the symptoms. His past medical history is significant for asthma.   Relevant past medical, surgical, family, and social history reviewed and updated as indicated.  Allergies and medications reviewed and updated. Data reviewed: Chart in Epic.   Past Medical History:  Diagnosis Date   Arthritis    Asthma    Colon polyps    GERD (gastroesophageal reflux disease)    Primary localized osteoarthritis of left knee 10/05/2019    Past Surgical History:  Procedure Laterality Date   BACK SURGERY     2008, 2009, 2010   CATARACT EXTRACTION, BILATERAL     CERVICAL SPINE SURGERY     HERNIA REPAIR      LEFT HEART CATH AND CORONARY ANGIOGRAPHY N/A 07/26/2020   Procedure: LEFT HEART CATH AND CORONARY ANGIOGRAPHY;  Surgeon: Jettie Booze, MD;  Location: [facility];  Service: Cardiovascular;  Laterality: N/A;   LUMBAR LAMINECTOMY      Social History   Socioeconomic History   Marital status: Married    Spouse name: Not on file   Number of children: Not on file   Years of education: Not on file   Highest education level: Not on file  Occupational History   Not on file  Tobacco Use   Smoking status: Every Day    Packs/day: 1.00    Years: 40.00    Pack years: 40.00    Types: Cigarettes   Smokeless tobacco: Never  Substance and Sexual Activity   Alcohol use: No   Drug use: No   Sexual activity: Never  Other Topics Concern   Not on file  Social History Narrative   Not on file   Social Determinants of Health   Financial Resource Strain: Not on file  Food Insecurity: Not on file  Transportation Needs: Not on file  Physical Activity: Not on file  Stress: Not on file  Social Connections: Not on file  Intimate Partner Violence: Not on file    Outpatient Encounter Medications as of 03/03/2021  Medication Sig   albuterol (VENTOLIN HFA) 108 (90 Base) MCG/ACT inhaler Inhale 2 puffs into the lungs every 6 (six) hours as needed for wheezing or shortness of breath.   amLODipine (NORVASC) 2.5 MG tablet  Take 1 tablet (2.5 mg total) by mouth daily.   clopidogrel (PLAVIX) 75 MG tablet Take 1 tablet (75 mg total) by mouth daily.   furosemide (LASIX) 20 MG tablet Take 1 tablet (20 mg total) by mouth as needed.   guaiFENesin (MUCINEX) 600 MG 12 hr tablet Take 1 tablet (600 mg total) by mouth 2 (two) times daily for 10 days.   nitroGLYCERIN (NITROSTAT) 0.4 MG SL tablet Place 1 tablet (0.4 mg total) under the tongue every 5 (five) minutes as needed for chest pain.   predniSONE (DELTASONE) 20 MG tablet Take 2 tablets (40 mg total) by mouth daily with breakfast for 5 days.   aspirin  81 MG tablet Take 81 mg by mouth daily. (Patient not taking: Reported on 03/03/2021)   atorvastatin (LIPITOR) 40 MG tablet Take 1 tablet (40 mg total) by mouth daily.   fish oil-omega-3 fatty acids 1000 MG capsule Take 1 g by mouth daily.    [DISCONTINUED] cephALEXin (KEFLEX) 500 MG capsule Take 500 mg by mouth 4 (four) times daily.   [DISCONTINUED] doxycycline (VIBRA-TABS) 100 MG tablet Take 100 mg by mouth 3 (three) times daily.   No facility-administered encounter medications on file as of 03/03/2021.    Allergies  Allergen Reactions   Codeine Nausea Only    Review of Systems  Constitutional:  Negative for chills, fever and weight loss.  HENT:  Positive for congestion. Negative for ear pain, postnasal drip, rhinorrhea and sore throat.   Respiratory:  Positive for cough. Negative for hemoptysis, shortness of breath and wheezing.   Cardiovascular:  Positive for chest pain.  Gastrointestinal:  Negative for heartburn.  Musculoskeletal:  Negative for myalgias.  Skin:  Negative for rash.  Neurological:  Negative for headaches.  All other systems reviewed and are negative.      Objective:  BP 125/78   Pulse 79   Temp 97.9 F (36.6 C)   Ht 6' (1.829 m)   Wt 168 lb (76.2 kg)   SpO2 95%   BMI 22.78 kg/m    Wt Readings from Last 3 Encounters:  03/03/21 168 lb (76.2 kg)  02/22/21 168 lb (76.2 kg)  08/24/20 170 lb (77.1 kg)    Physical Exam Vitals and nursing note reviewed.  Constitutional:      General: He is not in acute distress.    Appearance: Normal appearance. He is well-developed, well-groomed and normal weight. He is not ill-appearing, toxic-appearing or diaphoretic.  HENT:     Head: Normocephalic and atraumatic.     Jaw: There is normal jaw occlusion.     Right Ear: Hearing, tympanic membrane, ear canal and external ear normal.     Left Ear: Hearing, tympanic membrane, ear canal and external ear normal.     Nose: Nose normal.     Mouth/Throat:     Lips: Pink.      Mouth: Mucous membranes are moist.     Pharynx: Oropharynx is clear. Uvula midline.  Eyes:     General: Lids are normal.     Extraocular Movements: Extraocular movements intact.     Conjunctiva/sclera: Conjunctivae normal.     Pupils: Pupils are equal, round, and reactive to light.  Neck:     Thyroid: No thyroid mass, thyromegaly or thyroid tenderness.     Vascular: No carotid bruit or JVD.     Trachea: Trachea and phonation normal.  Cardiovascular:     Rate and Rhythm: Normal rate and regular rhythm.     Chest  Wall: PMI is not displaced.     Pulses: Normal pulses.     Heart sounds: Normal heart sounds. No murmur heard.   No friction rub. No gallop.  Pulmonary:     Effort: Pulmonary effort is normal. No respiratory distress.     Breath sounds: Normal breath sounds. No stridor. No wheezing, rhonchi or rales.  Chest:     Chest wall: No tenderness.  Abdominal:     General: Bowel sounds are normal. There is no distension or abdominal bruit.     Palpations: Abdomen is soft. There is no hepatomegaly or splenomegaly.     Tenderness: There is no abdominal tenderness. There is no right CVA tenderness or left CVA tenderness.     Hernia: No hernia is present.  Musculoskeletal:        General: Normal range of motion.     Cervical back: Normal range of motion and neck supple.     Right lower leg: No edema.     Left lower leg: No edema.  Lymphadenopathy:     Cervical: No cervical adenopathy.  Skin:    General: Skin is warm and dry.     Capillary Refill: Capillary refill takes less than 2 seconds.     Coloration: Skin is not cyanotic, jaundiced or pale.     Findings: No rash.  Neurological:     General: No focal deficit present.     Mental Status: He is alert and oriented to person, place, and time.     Sensory: Sensation is intact.     Motor: Motor function is intact.     Coordination: Coordination is intact.     Gait: Gait is intact.     Deep Tendon Reflexes: Reflexes are normal and  symmetric.  Psychiatric:        Attention and Perception: Attention and perception normal.        Mood and Affect: Mood and affect normal.        Speech: Speech normal.        Behavior: Behavior normal. Behavior is cooperative.        Thought Content: Thought content normal.        Cognition and Memory: Cognition and memory normal.        Judgment: Judgment normal.    Results for orders placed or performed in visit on 02/22/21  Comp Met (CMET)  Result Value Ref Range   Glucose 87 70 - 99 mg/dL   BUN 19 8 - 27 mg/dL   Creatinine, Ser 0.93 0.76 - 1.27 mg/dL   eGFR 89 >59 mL/min/1.73   BUN/Creatinine Ratio 20 10 - 24   Sodium 139 134 - 144 mmol/L   Potassium 4.3 3.5 - 5.2 mmol/L   Chloride 98 96 - 106 mmol/L   CO2 27 20 - 29 mmol/L   Calcium 9.8 8.6 - 10.2 mg/dL   Total Protein 6.7 6.0 - 8.5 g/dL   Albumin 4.5 3.8 - 4.8 g/dL   Globulin, Total 2.2 1.5 - 4.5 g/dL   Albumin/Globulin Ratio 2.0 1.2 - 2.2   Bilirubin Total 0.5 0.0 - 1.2 mg/dL   Alkaline Phosphatase 118 44 - 121 IU/L   AST 19 0 - 40 IU/L   ALT 16 0 - 44 IU/L  Lipid panel  Result Value Ref Range   Cholesterol, Total 116 100 - 199 mg/dL   Triglycerides 64 0 - 149 mg/dL   HDL 44 >39 mg/dL   VLDL Cholesterol Cal 14 5 -  40 mg/dL   LDL Chol Calc (NIH) 58 0 - 99 mg/dL   Chol/HDL Ratio 2.6 0.0 - 5.0 ratio     X-Ray: no acute findings: No acute findings. Preliminary x-ray reading by Monia Pouch, FNP-C, [facility].   Pertinent labs & imaging results that were available during my care of the patient were reviewed by me and considered in my medical decision making.  Assessment & Plan:  Giancarlo was seen today for cough.  Diagnoses and all orders for this visit:  Cough productive of clear sputum Acute bronchitis, viral CXR without acute findings. Lungs slightly hyperinflated. No indication of acute bacterial infection. Will treat with steroids, Mucinex, and Albuterol. Symptomatic care discussed in detail. Pt aware to report  any new, worsening, or persistent symptoms.  -     albuterol (VENTOLIN HFA) 108 (90 Base) MCG/ACT inhaler; Inhale 2 puffs into the lungs every 6 (six) hours as needed for wheezing or shortness of breath. -     predniSONE (DELTASONE) 20 MG tablet; Take 2 tablets (40 mg total) by mouth daily with breakfast for 5 days. -     guaiFENesin (MUCINEX) 600 MG 12 hr tablet; Take 1 tablet (600 mg total) by mouth 2 (two) times daily for 10 days.     Continue all other maintenance medications.  Follow up plan: Return if symptoms worsen or fail to improve.   Continue healthy lifestyle choices, including diet (rich in fruits, vegetables, and lean proteins, and low in salt and simple carbohydrates) and exercise (at least 30 minutes of moderate physical activity daily).  Educational handout given for bronchitis   The above assessment and management plan was discussed with the patient. The patient verbalized understanding of and has agreed to the management plan. Patient is aware to call the clinic if they develop any new symptoms or if symptoms persist or worsen. Patient is aware when to return to the clinic for a follow-up visit. Patient educated on when it is appropriate to go to the emergency department.   Monia Pouch, FNP-C [facility] [phone]

## 2021-04-08 DIAGNOSIS — U071 COVID-19: Secondary | ICD-10-CM | POA: Diagnosis not present

## 2021-04-08 DIAGNOSIS — Z23 Encounter for immunization: Secondary | ICD-10-CM | POA: Diagnosis not present

## 2021-07-19 ENCOUNTER — Other Ambulatory Visit: Payer: Self-pay | Admitting: Medical

## 2021-07-19 ENCOUNTER — Other Ambulatory Visit: Payer: Self-pay | Admitting: Physician Assistant

## 2021-07-19 NOTE — Telephone Encounter (Signed)
This is Dr. Berry's pt 

## 2021-07-25 DIAGNOSIS — M542 Cervicalgia: Secondary | ICD-10-CM | POA: Diagnosis not present

## 2021-07-25 DIAGNOSIS — I10 Essential (primary) hypertension: Secondary | ICD-10-CM | POA: Diagnosis not present

## 2021-07-27 DIAGNOSIS — M542 Cervicalgia: Secondary | ICD-10-CM | POA: Diagnosis not present

## 2021-07-27 DIAGNOSIS — M2578 Osteophyte, vertebrae: Secondary | ICD-10-CM | POA: Diagnosis not present

## 2021-08-10 ENCOUNTER — Telehealth: Payer: Self-pay | Admitting: *Deleted

## 2021-08-10 DIAGNOSIS — M5412 Radiculopathy, cervical region: Secondary | ICD-10-CM | POA: Diagnosis not present

## 2021-08-10 DIAGNOSIS — M542 Cervicalgia: Secondary | ICD-10-CM | POA: Diagnosis not present

## 2021-08-10 NOTE — Telephone Encounter (Signed)
Primary Cardiologist:Jonathan Gwenlyn Found, MD ? ?Chart reviewed as part of pre-operative protocol coverage. Because of Wesley Schultz's past medical history and time since last visit, he/she will require a virtual visit/telephone call in order to better assess preoperative cardiovascular risk. ? ?Pre-op covering staff: ?- Please contact patient, obtain consent, and schedule appointment  ? ?If applicable, this message will also be routed to primary cardiologist for input on holding antiplatelet agent as requested below so that this information is available at time of patient's appointment.  ? ? ?Emmaline Life, NP-C ? ?  ?08/10/2021, 7:43 PM ?Wesley Schultz ?[address] ?Office [phone] Fax [phone] ?' ?

## 2021-08-10 NOTE — Telephone Encounter (Signed)
? ?  Pre-operative Risk Assessment  ?  ?Patient Name: Wesley Schultz  ?DOB: Aug 19, 1950 ?MRN: [id number]  ? ?  ? ?Request for Surgical Clearance   ? ?Procedure:   CERVICAL SPINE INJECTION ? ?Date of Surgery:  Clearance TBD                              ?   ?Surgeon:  DR Lenord Carbo ?Surgeon's Group or Practice Name:  [facility] ?Phone number:  [phone] ?Fax number:  [phone]  ?  ?Type of Clearance Requested:   ?- Medical  ?- Pharmacy:  Hold Clopidogrel (Plavix) 7 DAYS, RESUME  IMMEDIATELY AFTER ?  ?Type of Anesthesia:  Not Indicated ?  ?Additional requests/questions:   N/A ? ?Signed, ?Trixie Dredge V   ?08/10/2021, 4:25 PM   ?

## 2021-08-10 NOTE — Telephone Encounter (Signed)
Wesley Schultz, I am covering for Dr. Gwenlyn Found. I think that he can safely stop the clopidogrel since it has been a year since his last stent. ?

## 2021-08-10 NOTE — Telephone Encounter (Signed)
Dr. Gwenlyn Found, patient had elevated troponin and cardiac cath revealing nonobstructive disease 07/26/2020:  ? ?Plan: With elevated troponin, we will treat with aspirin and Plavix likely for least 6 months given ACS, will add amlodipine 2.5 mg p.o. for possible spasm and antianginal benefit. ?  ?He will need close follow-up with either Dr. Gwenlyn Found or one of his APPs Daleen Snook [place], [place] - if possible) ?  ?Glenetta Hew, MD ? ?Request for patient to hold Plavix for cervical spine injection with [facility]. May patient discontinue the Plavix all together or do you prefer that he hold for the procedure only.  ?Please direct your response to p cv div preop ? ?Emmaline Life, NP-C ? ?  ?08/10/2021, 7:48 PM ?[place] ?[address] ?Office [phone] Fax [phone]' ?

## 2021-08-11 ENCOUNTER — Telehealth: Payer: Self-pay | Admitting: *Deleted

## 2021-08-11 NOTE — Telephone Encounter (Signed)
Pt agreeable to tele pre op appt 08/15/21 @ 1 pm. Med rec and consent are done.. ? ?  ?Patient Consent for Virtual Visit  ? ? ?   ? ?Wesley Schultz has provided verbal consent on 08/11/2021 for a virtual visit (video or telephone). ? ? ?CONSENT FOR VIRTUAL VISIT FOR:  Wesley Schultz  ?By participating in this virtual visit I agree to the following: ? ?I hereby voluntarily request, consent and authorize [facility] and its employed or contracted physicians, physician assistants, nurse practitioners or other licensed health care professionals (the Practitioner), to provide me with telemedicine health care services (the ?Services") as deemed necessary by the treating Practitioner. I acknowledge and consent to receive the Services by the Practitioner via telemedicine. I understand that the telemedicine visit will involve communicating with the Practitioner through live audiovisual communication technology and the disclosure of certain medical information by electronic transmission. I acknowledge that I have been given the opportunity to request an in-person assessment or other available alternative prior to the telemedicine visit and am voluntarily participating in the telemedicine visit. ? ?I understand that I have the right to withhold or withdraw my consent to the use of telemedicine in the course of my care at any time, without affecting my right to future care or treatment, and that the Practitioner or I may terminate the telemedicine visit at any time. I understand that I have the right to inspect all information obtained and/or recorded in the course of the telemedicine visit and may receive copies of available information for a reasonable fee.  I understand that some of the potential risks of receiving the Services via telemedicine include:  ?Delay or interruption in medical evaluation due to technological equipment failure or disruption; ?Information transmitted may not be sufficient (e.g. poor resolution of  images) to allow for appropriate medical decision making by the Practitioner; and/or  ?In rare instances, security protocols could fail, causing a breach of personal health information. ? ?Furthermore, I acknowledge that it is my responsibility to provide information about my medical history, conditions and care that is complete and accurate to the best of my ability. I acknowledge that Practitioner's advice, recommendations, and/or decision may be based on factors not within their control, such as incomplete or inaccurate data provided by me or distortions of diagnostic images or specimens that may result from electronic transmissions. I understand that the practice of medicine is not an exact science and that Practitioner makes no warranties or guarantees regarding treatment outcomes. I acknowledge that a copy of this consent can be made available to me via my patient portal ([facility]), or I can request a printed copy by calling the office of [facility].   ? ?I understand that my insurance will be billed for this visit.  ? ?I have read or had this consent read to me. ?I understand the contents of this consent, which adequately explains the benefits and risks of the Services being provided via telemedicine.  ?I have been provided ample opportunity to ask questions regarding this consent and the Services and have had my questions answered to my satisfaction. ?I give my informed consent for the services to be provided through the use of telemedicine in my medical care ? ? ? ?

## 2021-08-11 NOTE — Telephone Encounter (Signed)
Pt agreeable to tele pre op appt 08/15/21 @ 1 pm. Med rec and consent are done.Marland Kitchen ?

## 2021-08-11 NOTE — Telephone Encounter (Signed)
? ? ?  Name: Wesley Schultz  ?DOB: July 13, 1950  ?MRN: [id number] ? ?Primary Cardiologist: Quay Burow, MD ? ? ?Preoperative team, please contact this patient and set up a phone call appointment for further preoperative risk assessment. Please obtain consent and complete medication review. Thank you for your help. ? ?Per Dr. Sallyanne Kuster: ?I am covering for Dr. Gwenlyn Found. I think that he can safely stop the clopidogrel since it has been a year since his last stent. ? ? ?Ledora Bottcher, PA-C ?08/11/2021, 7:30 AM ?[phone] ?[facility] ?[address] ?[place], [place] [address] ? ? ?

## 2021-08-15 ENCOUNTER — Ambulatory Visit (INDEPENDENT_AMBULATORY_CARE_PROVIDER_SITE_OTHER): Payer: Medicare Other | Admitting: Physician Assistant

## 2021-08-15 DIAGNOSIS — Z01810 Encounter for preprocedural cardiovascular examination: Secondary | ICD-10-CM

## 2021-08-15 NOTE — Telephone Encounter (Addendum)
See 4/18 ph note, Pt needs in office visit before relaying final Plavix recommendations, visit routed to callback to arrange. ? ?Regarding the previous preliminary recommendations to hold Plavix, I did clarify with Dr. Sallyanne Kuster that the cessation was only intended to be a temporary pre-surgical recommendation rather than definitive cessation so this will need to be clarified based on patient's workup at in-office visit. ?

## 2021-08-15 NOTE — Progress Notes (Signed)
I will reach out to [facility] scheduling to see if they may please help out and schedule the pt an appt. Pt had tele pre op call today with Melina Copa, PAC and states he has been having chest pain. See notes from Melina Copa, [facility]. Pt can be seen withy DOD or APP if primary card not available.  ?

## 2021-08-15 NOTE — Progress Notes (Addendum)
? ?Virtual Visit via Telephone Note  ? ?This visit type was conducted due to national recommendations for restrictions regarding the COVID-19 Pandemic (e.g. social distancing) in an effort to limit this patient's exposure and mitigate transmission in our community.  Due to his co-morbid illnesses, this patient is at least at moderate risk for complications without adequate follow up.  This format is felt to be most appropriate for this patient at this time.  The patient did not have access to video technology/had technical difficulties with video requiring transitioning to audio format only (telephone).  All issues noted in this document were discussed and addressed.  No physical exam could be performed with this format.  Please refer to the patient's chart for his  consent to telehealth for Wesley Schultz. ? ?Evaluation Performed:  Preoperative cardiovascular risk assessment ?_____________  ? ?Date:  08/15/2021  ? ?Patient ID:  Wesley Schultz, Wesley Schultz [birth year], MRN [id number] ?Patient Location:  ?Home ?Provider location:   ?Office ? ?Primary Care Provider:  Sharion Balloon, FNP ?Primary Cardiologist:  Quay Burow, MD ? ?Chief Complaint  ?  ?71 y.o. y/o male with a h/o nonobstructive diffuse CAD by cath 06/2020, HTN, HLD, GERD, asthma, tobacco abuse who is pending cervical spine injection, and presents today for telephonic preoperative cardiovascular risk assessment. ? ?Past Medical History  ?  ?Past Medical History:  ?Diagnosis Date  ? Arthritis   ? Asthma   ? CAD (coronary artery disease)   ? Colon polyps   ? GERD (gastroesophageal reflux disease)   ? Primary localized osteoarthritis of left knee 10/05/2019  ? ?Past Surgical History:  ?Procedure Laterality Date  ? BACK SURGERY    ? 2008, 2009, 2010  ? CATARACT EXTRACTION, BILATERAL    ? CERVICAL SPINE SURGERY    ? HERNIA REPAIR    ? LEFT HEART CATH AND CORONARY ANGIOGRAPHY N/A 07/26/2020  ? Procedure: LEFT HEART CATH AND CORONARY ANGIOGRAPHY;  Surgeon: Jettie Booze, MD;  Location: [facility];  Service: Cardiovascular;  Laterality: N/A;  ? LUMBAR LAMINECTOMY    ? ? ?Allergies ? ?Allergies  ?Allergen Reactions  ? Codeine Nausea Only  ? ? ?History of Present Illness  ?  ?Wesley Schultz is a 71 y.o. male who presents via audio/video conferencing for a telehealth visit today. Per chart review, he had undergone cath for NSTEMI 06/2020 which showed diffuse nonobstructive disease, recommended for ongoing aggressive risk factor modification.  Echocardiogram 06/2020 showed EF 55-60%, normal LV diastolic function, no R WMA, mild LAE, and no significant valvular abnormalities.  He was started on amlodipine for possible coronary spasm. Pt was last seen in cardiology clinic on 02/22/21 by Roby Lofts PA-C. At that time Wesley Schultz was doing well except had some swelling in his legs in the setting of excess salt.  The patient is now pending cervical spine injection and needs to hold Plavix.  ? ?Upon starting the visit he states he is glad we called because he was hoping to get an appointment to be seen in the office. A week ago he had a few episodes of spontaneous chest discomfort lasting a few minutes with radiation to his arm. This resolved spontaneously and he has not had any recurrence since that time. ? ? ?Home Medications  ?  ?Prior to Admission medications   ?Medication Sig Start Date End Date Taking? Authorizing Provider  ?albuterol (VENTOLIN HFA) 108 (90 Base) MCG/ACT inhaler Inhale 2 puffs into the lungs every 6 (six)  hours as needed for wheezing or shortness of breath. 03/03/21   Baruch Gouty, FNP  ?amLODipine (NORVASC) 2.5 MG tablet Take 1 tablet by mouth once daily 07/19/21   Lorretta Harp, MD  ?aspirin 81 MG tablet Take 81 mg by mouth daily. ?Patient not taking: Reported on 03/03/2021    [provider]  ?atorvastatin (LIPITOR) 40 MG tablet Take 1 tablet by mouth once daily 07/19/21   Lorretta Harp, MD  ?clopidogrel (PLAVIX) 75 MG tablet Take 1  tablet by mouth once daily 07/19/21   Lorretta Harp, MD  ?fish oil-omega-3 fatty acids 1000 MG capsule Take 1 g by mouth daily.     [provider]  ?furosemide (LASIX) 20 MG tablet Take 1 tablet (20 mg total) by mouth as needed. ?Patient not taking: Reported on 08/11/2021 02/22/21 05/23/21  Abigail Butts., PA-C  ?nitroGLYCERIN (NITROSTAT) 0.4 MG SL tablet Place 1 tablet (0.4 mg total) under the tongue every 5 (five) minutes as needed for chest pain. 07/25/20   Kroeger, Lorelee Cover., PA-C  ? ? ?Physical Exam  ?  ?Vital Signs:  Wesley Schultz does not have vital signs available for review today. ? ?Given telephonic nature of communication, physical exam is limited. ?AAOx3. NAD. Normal affect.  Speech and respirations are unlabored. ? ?Accessory Clinical Findings  ?  ?None ? ?Assessment & Plan  ?  ?1.  Preoperative Cardiovascular Risk Assessment: ?Patient is requesting to be seen in the office for evaluation due to recent chest pain. A week ago he had a few episodes of spontaneous chest discomfort lasting a few minutes with radiation to his arm. This resolved spontaneously and he has not had any recurrence since that time. He also quit taking his cholesterol medicine. ? ?Given new/worsening symptoms, he will need in-office evaluation for clearance prior to giving permission to hold Plavix. I told him not to stop any of his blood thinners at this time. Will route this note to callback team to get him scheduled. Reviewed ER precautions with patient. He states he's not be interested in going to the ER if it happens.  ? ?Regarding the previous preliminary recommendations to hold Plavix, I did clarify with Dr. Sallyanne Kuster that the cessation was only intended to be a temporary pre-surgical recommendation rather than definitive cessation so this will need to be clarified based on patient's workup at in-office visit. ? ?A copy of this note will be routed to requesting surgeon. ? ?Time:   ?Today, I have spent 6 minutes  with the patient with telehealth technology discussing medical history, symptoms, and management plan.  No charge given need for in-office visit. ? ? ?Charlie Pitter, PA-C ? ?08/15/2021, 1:06 PM ? ?

## 2021-08-16 ENCOUNTER — Encounter: Payer: Self-pay | Admitting: Cardiovascular Disease

## 2021-08-16 ENCOUNTER — Ambulatory Visit (INDEPENDENT_AMBULATORY_CARE_PROVIDER_SITE_OTHER): Payer: Medicare Other | Admitting: Cardiovascular Disease

## 2021-08-16 VITALS — BP 118/68 | HR 73 | Ht 72.0 in | Wt 164.2 lb

## 2021-08-16 DIAGNOSIS — Z01810 Encounter for preprocedural cardiovascular examination: Secondary | ICD-10-CM

## 2021-08-16 DIAGNOSIS — Z720 Tobacco use: Secondary | ICD-10-CM

## 2021-08-16 DIAGNOSIS — I2510 Atherosclerotic heart disease of native coronary artery without angina pectoris: Secondary | ICD-10-CM

## 2021-08-16 DIAGNOSIS — E785 Hyperlipidemia, unspecified: Secondary | ICD-10-CM | POA: Diagnosis not present

## 2021-08-16 DIAGNOSIS — F17210 Nicotine dependence, cigarettes, uncomplicated: Secondary | ICD-10-CM | POA: Diagnosis not present

## 2021-08-16 DIAGNOSIS — R072 Precordial pain: Secondary | ICD-10-CM

## 2021-08-16 MED ORDER — ROSUVASTATIN CALCIUM 20 MG PO TABS: 20.0000 mg | ORAL_TABLET | Freq: Every day | ORAL | 3 refills | Status: DC

## 2021-08-16 MED ORDER — ASPIRIN EC 81 MG PO TBEC
81.0000 mg | DELAYED_RELEASE_TABLET | Freq: Every day | ORAL | 3 refills | Status: AC
Start: 2021-08-16 — End: ?

## 2021-08-16 NOTE — Progress Notes (Signed)
?Cardiology Office Note:   ?Date:  08/16/2021  ?NAME:  Wesley Schultz    ?MRN: [id number] ?DOB:  December 18, 1950  ? ?PCP:  Sharion Balloon, FNP  ?Cardiologist:  Quay Burow, MD  ?Electrophysiologist:  None  ? ?Referring MD: Sharion Balloon, FNP  ? ?Chief Complaint  ?Patient presents with  ? Chest Pain  ? ? ?History of Present Illness:   ?Wesley Schultz is a 71 y.o. male with a hx of non-obstructive CAD, HTN, HLD, tobacco abuse who presents for follow-up.  He reports he had an episode of chest pain 1 week ago.  Describes a dull achy pain in the center of his chest that occurred around 10 PM 1 week ago.  He reports symptoms lasted seconds and resolved.  He then had a second episode of right-sided achiness in his chest.  Symptoms again resolved in seconds.  He had no further episodes.  He reports no exertional chest pain or pressure.  He does have a chronic cough and very terrible sounding lungs.  Has been recommended to see a lung doctor but he is not interested.  He reports no increased shortness of breath and usual for him.  Denies any further chest pain episodes.  Still smoking a pack per day.  Has done so for 60 years.  EKG demonstrates sinus rhythm with possible inferior infarct.  I do question the lead placement.  His cardiovascular examination is unremarkable.  I hear no murmurs.  He does have diffuse rhonchi noted on examination likely consistent with COPD.  He is remained on Plavix.  He cannot take Lipitor due to feeling poorly on this medication.  Blood pressure is well controlled on amlodipine.  Symptoms do not appear to be similar to his prior episode of an acute coronary syndrome. ? ?Problem List ?Non-obstructive CAD ?-vasospasm? ?2. HTN ?3. HLD ?4. Tobacco abuse  ?-60 pack years  ? ?Past Medical History: ?Past Medical History:  ?Diagnosis Date  ? Arthritis   ? Asthma   ? CAD (coronary artery disease)   ? Colon polyps   ? GERD (gastroesophageal reflux disease)   ? Primary localized osteoarthritis of left knee  10/05/2019  ? ? ?Past Surgical History: ?Past Surgical History:  ?Procedure Laterality Date  ? BACK SURGERY    ? 2008, 2009, 2010  ? CATARACT EXTRACTION, BILATERAL    ? CERVICAL SPINE SURGERY    ? HERNIA REPAIR    ? LEFT HEART CATH AND CORONARY ANGIOGRAPHY N/A 07/26/2020  ? Procedure: LEFT HEART CATH AND CORONARY ANGIOGRAPHY;  Surgeon: Jettie Booze, MD;  Location: [facility];  Service: Cardiovascular;  Laterality: N/A;  ? LUMBAR LAMINECTOMY    ? ? ?Current Medications: ?Current Meds  ?Medication Sig  ? amLODipine (NORVASC) 2.5 MG tablet Take 1 tablet by mouth once daily  ? aspirin EC 81 MG tablet Take 1 tablet (81 mg total) by mouth daily. Swallow whole.  ? nitroGLYCERIN (NITROSTAT) 0.4 MG SL tablet Place 1 tablet (0.4 mg total) under the tongue every 5 (five) minutes as needed for chest pain.  ? rosuvastatin (CRESTOR) 20 MG tablet Take 1 tablet (20 mg total) by mouth daily.  ? [DISCONTINUED] clopidogrel (PLAVIX) 75 MG tablet Take 1 tablet by mouth once daily  ?  ? ?Allergies:    ?Codeine  ? ?Social History: ?Social History  ? ?Socioeconomic History  ? Marital status: Married  ?  Spouse name: Not on file  ? Number of children: Not on file  ?  Years of education: Not on file  ? Highest education level: Not on file  ?Occupational History  ? Not on file  ?Tobacco Use  ? Smoking status: Every Day  ?  Packs/day: 1.00  ?  Years: 40.00  ?  Pack years: 40.00  ?  Types: Cigarettes  ? Smokeless tobacco: Never  ?Substance and Sexual Activity  ? Alcohol use: No  ? Drug use: No  ? Sexual activity: Never  ?Other Topics Concern  ? Not on file  ?Social History Narrative  ? Not on file  ? ?Social Determinants of Health  ? ?Financial Resource Strain: Not on file  ?Food Insecurity: Not on file  ?Transportation Needs: Not on file  ?Physical Activity: Not on file  ?Stress: Not on file  ?Social Connections: Not on file  ?  ? ?Family History: ?The patient's family history includes Colon cancer (age of onset: 61) in his  sister; Diabetes in his mother; Heart disease in his father; Seizures in his mother. There is no history of Stomach cancer. ? ?ROS:   ?All other ROS reviewed and negative. Pertinent positives noted in the HPI.    ? ?EKGs/Labs/Other Studies Reviewed:   ?The following studies were personally reviewed by me today: ? ?EKG:  EKG is ordered today.  The ekg ordered today demonstrates normal sinus rhythm heart rate 73, inferior infarct, and was personally reviewed by me.  ? ?[place] 07/26/2020 ?Ramus lesion is 50% stenosed. ?The left ventricular systolic function is normal. ?LV end diastolic pressure is normal. ?The left ventricular ejection fraction is 55-65% by visual estimate. ?There is no aortic valve stenosis. ?  ?Diffuse nonobstructive disease.  No culprit lesion for elevated troponin.  Continue preventive therapy.   ?  ?D/w Dr. Ellyn Hack.  Possible discharge later today.  ? ?[facility] 07/26/2020 ? 1. Left ventricular ejection fraction, by estimation, is 55 to 60%. The  ?left ventricle has normal function. The left ventricle has no regional  ?wall motion abnormalities. Left ventricular diastolic parameters were  ?normal.  ? 2. Right ventricular systolic function is normal. The right ventricular  ?size is normal. There is normal pulmonary artery systolic pressure.  ? 3. Left atrial size was mildly dilated.  ? 4. The mitral valve is normal in structure. Trivial mitral valve  ?regurgitation. No evidence of mitral stenosis.  ? 5. The aortic valve is tricuspid. Aortic valve regurgitation is not  ?visualized. No aortic stenosis is present.  ? 6. The inferior vena cava is normal in size with greater than 50%  ?respiratory variability, suggesting right atrial pressure of 3 mmHg.  ? ?Recent Labs: ?03/01/2021: ALT 16; BUN 19; Creatinine, Ser 0.93; Potassium 4.3; Sodium 139  ? ?Recent Lipid Panel ?   ?Component Value Date/Time  ? CHOL 116 03/01/2021 1038  ? TRIG 64 03/01/2021 1038  ? HDL 44 03/01/2021 1038  ? CHOLHDL 2.6 03/01/2021 1038  ?  CHOLHDL 3.0 07/26/2020 0326  ? VLDL 7 07/26/2020 0326  ? [place] 58 03/01/2021 1038  ? ? ?Physical Exam:   ?VS:  BP 118/68 (BP Location: Left Arm, Patient Position: Sitting, Cuff Size: Normal)   Pulse 73   Ht 6' (1.829 m)   Wt 164 lb 3.2 oz (74.5 kg)   SpO2 93%   BMI 22.27 kg/m?    ?Wt Readings from Last 3 Encounters:  ?08/16/21 164 lb 3.2 oz (74.5 kg)  ?03/03/21 168 lb (76.2 kg)  ?02/22/21 168 lb (76.2 kg)  ?  ?General: Well nourished, well developed, in  no acute distress ?Head: Atraumatic, normal size  ?Eyes: PEERLA, EOMI  ?Neck: Supple, no JVD ?Endocrine: No thryomegaly ?Cardiac: Normal S1, S2; RRR; no murmurs, rubs, or gallops ?Lungs: Diffuse rhonchi ?Abd: Soft, nontender, no hepatomegaly  ?Ext: No edema, pulses 2+ ?Musculoskeletal: No deformities, BUE and BLE strength normal and equal ?Skin: Warm and dry, no rashes   ?Neuro: Alert and oriented to person, place, time, and situation, CNII-XII grossly intact, no focal deficits  ?Psych: Normal mood and affect  ? ?ASSESSMENT:   ?Wesley Schultz is a 71 y.o. male who presents for the following: ?1. Precordial pain   ?2. Coronary artery disease involving native coronary artery of native heart without angina pectoris   ?3. Hyperlipidemia LDL goal <70   ?4. Preop cardiovascular exam   ?5. Tobacco abuse   ? ? ?PLAN:   ?1. Precordial pain ?-One-time episode of chest discomfort at rest last seconds last week.  Symptoms have not recurred.  History of nonobstructive CAD.  He is still smoking.  Overall his EKG demonstrates sinus rhythm with no acute ischemic changes.  There is questionable new inferior Q waves.  Suspect this could be lead placement related.  I have recommended an echocardiogram just to make sure there is been no change.  Given his lack of symptoms as long as his echo is normal and symptoms do not recur he does not need any further evaluation.  He is okay to proceed with his neck injections.  Again I am not concerned this represents cardiac chest pain. ? ?2.  Coronary artery disease involving native coronary artery of native heart without angina pectoris ?3. Hyperlipidemia LDL goal <70 ?-Nonobstructive CAD on left heart cath last year.  Transition aspirin 81 mg daily.

## 2021-08-16 NOTE — Patient Instructions (Signed)
Medication Instructions:  ?STOP Plavix ?Start Aspirin 81 mg daily ?START Crestor 20 mg daily  ? ?*If you need a refill on your cardiac medications before your next appointment, please call your pharmacy* ? ? ?Testing/Procedures: ?Echocardiogram - Your physician has requested that you have an echocardiogram. Echocardiography is a painless test that uses sound waves to create images of your heart. It provides your doctor with information about the size and shape of your heart and how well your heart?s chambers and valves are working. This procedure takes approximately one hour. There are no restrictions for this procedure. This will be performed at either our [facility] location - [address] [place] location [facility] 2nd floor. ? ? ? ?Follow-Up: ?At [facility], you and your health needs are our priority.  As part of our continuing mission to provide you with exceptional heart care, we have created designated Provider Care Teams.  These Care Teams include your primary Cardiologist (physician) and Advanced Practice Providers (APPs -  Physician Assistants and Nurse Practitioners) who all work together to provide you with the care you need, when you need it. ? ?We recommend signing up for the patient portal called "MyChart".  Sign up information is provided on this After Visit Summary.  MyChart is used to connect with patients for Virtual Visits (Telemedicine).  Patients are able to view lab/test results, encounter notes, upcoming appointments, etc.  Non-urgent messages can be sent to your provider as well.   ?To learn more about what you can do with MyChart, go to [email or web address].   ? ?Your next appointment:   ?6 month(s) ? ?The format for your next appointment:   ?In Person ? ?Provider:   ?Quay Burow, MD   ? ?Important Information About Sugar ? ? ? ? ? ? ?

## 2021-08-16 NOTE — Progress Notes (Signed)
Pt has appt 08/16/21 with Dr. Audie Box  ?

## 2021-08-25 ENCOUNTER — Ambulatory Visit (HOSPITAL_BASED_OUTPATIENT_CLINIC_OR_DEPARTMENT_OTHER): Payer: Medicare Other

## 2021-08-25 DIAGNOSIS — R072 Precordial pain: Secondary | ICD-10-CM

## 2021-08-25 LAB — ECHOCARDIOGRAM COMPLETE
AR max vel: 2.81 cm2
AV Area VTI: 2.56 cm2
AV Area mean vel: 2.56 cm2
AV Mean grad: 4 mmHg
AV Peak grad: 9.5 mmHg
Ao pk vel: 1.54 m/s
Area-P 1/2: 3.15 cm2
Calc EF: 55.5 %
S' Lateral: 3.13 cm
Single Plane A2C EF: 50.4 %
Single Plane A4C EF: 59.8 %

## 2021-09-06 DIAGNOSIS — M5412 Radiculopathy, cervical region: Secondary | ICD-10-CM | POA: Diagnosis not present

## 2021-09-06 DIAGNOSIS — M542 Cervicalgia: Secondary | ICD-10-CM | POA: Diagnosis not present

## 2021-09-13 NOTE — Telephone Encounter (Addendum)
? ? ?  Patient Name: Wesley Schultz  ?DOB: 10-14-1950 ?MRN: [id number] ? ?Primary Cardiologist: Quay Burow, MD ? ?Chart reviewed as part of pre-operative protocol coverage. Given past medical history and time since last visit, based on ACC/AHA guidelines, Wesley Schultz would be at acceptable risk for the planned procedure without further cardiovascular testing.  ? ?The patient was cleared for the procedure by Dr. Audie Box at office visit on 08/16/2021. I have forwarded his note to the requesting party.  ? ?Patient may hold Plavix for 7 days prior to procedure per Dr. Sallyanne Kuster as previously documented. Please resume Plavix as soon as possible postprocedure, at the discretion of the surgeon.   ? ?I will route this recommendation to the requesting party via Epic fax function and remove from pre-op pool. ? ?Please call with questions. ? ?Lenna Sciara, NP ?09/13/2021, 10:18 AM  ?

## 2021-09-21 DIAGNOSIS — M5412 Radiculopathy, cervical region: Secondary | ICD-10-CM | POA: Diagnosis not present

## 2021-10-19 DIAGNOSIS — M5412 Radiculopathy, cervical region: Secondary | ICD-10-CM | POA: Diagnosis not present

## 2021-10-19 DIAGNOSIS — M542 Cervicalgia: Secondary | ICD-10-CM | POA: Diagnosis not present

## 2021-10-25 ENCOUNTER — Telehealth: Payer: Self-pay | Admitting: Cardiovascular Disease

## 2021-10-25 NOTE — Telephone Encounter (Signed)
*  STAT* If patient is at the pharmacy, call can be transferred to refill team.   1. Which medications need to be refilled? (please list name of each medication and dose if known) amLODipine (NORVASC) 2.5 MG tablet rosuvastatin (CRESTOR) 20 MG tablet  2. Which pharmacy/location (including street and city if local pharmacy) is medication to be sent to? [place], [place] [address]  3. Do they need a 30 day or 90 day supply? [place]

## 2021-10-26 MED ORDER — AMLODIPINE BESYLATE 2.5 MG PO TABS: 2.5000 mg | ORAL_TABLET | Freq: Every day | ORAL | 2 refills | Status: DC

## 2021-10-26 MED ORDER — ROSUVASTATIN CALCIUM 20 MG PO TABS
20.0000 mg | ORAL_TABLET | Freq: Every day | ORAL | 3 refills | Status: DC
Start: 2021-10-26 — End: 2022-12-27

## 2021-11-20 DIAGNOSIS — M19072 Primary osteoarthritis, left ankle and foot: Secondary | ICD-10-CM | POA: Diagnosis not present

## 2021-11-20 DIAGNOSIS — M170 Bilateral primary osteoarthritis of knee: Secondary | ICD-10-CM | POA: Diagnosis not present

## 2021-11-22 DIAGNOSIS — M5416 Radiculopathy, lumbar region: Secondary | ICD-10-CM | POA: Diagnosis not present

## 2021-11-22 DIAGNOSIS — M1612 Unilateral primary osteoarthritis, left hip: Secondary | ICD-10-CM | POA: Diagnosis not present

## 2021-11-22 DIAGNOSIS — M19071 Primary osteoarthritis, right ankle and foot: Secondary | ICD-10-CM | POA: Diagnosis not present

## 2021-11-22 DIAGNOSIS — M19072 Primary osteoarthritis, left ankle and foot: Secondary | ICD-10-CM | POA: Diagnosis not present

## 2021-12-19 DIAGNOSIS — M5416 Radiculopathy, lumbar region: Secondary | ICD-10-CM | POA: Diagnosis not present

## 2021-12-25 DIAGNOSIS — M5412 Radiculopathy, cervical region: Secondary | ICD-10-CM | POA: Diagnosis not present

## 2021-12-27 DIAGNOSIS — M5117 Intervertebral disc disorders with radiculopathy, lumbosacral region: Secondary | ICD-10-CM | POA: Diagnosis not present

## 2021-12-27 DIAGNOSIS — M48061 Spinal stenosis, lumbar region without neurogenic claudication: Secondary | ICD-10-CM | POA: Diagnosis not present

## 2021-12-27 DIAGNOSIS — M5416 Radiculopathy, lumbar region: Secondary | ICD-10-CM | POA: Diagnosis not present

## 2022-01-02 DIAGNOSIS — M5416 Radiculopathy, lumbar region: Secondary | ICD-10-CM | POA: Diagnosis not present

## 2022-01-17 DIAGNOSIS — M5416 Radiculopathy, lumbar region: Secondary | ICD-10-CM | POA: Diagnosis not present

## 2022-01-30 ENCOUNTER — Ambulatory Visit: Payer: Medicare Other | Attending: Cardiovascular Disease | Admitting: Cardiovascular Disease

## 2022-01-30 ENCOUNTER — Encounter: Payer: Self-pay | Admitting: Cardiovascular Disease

## 2022-01-30 VITALS — BP 100/58 | HR 66

## 2022-01-30 DIAGNOSIS — F17200 Nicotine dependence, unspecified, uncomplicated: Secondary | ICD-10-CM | POA: Diagnosis not present

## 2022-01-30 DIAGNOSIS — I2510 Atherosclerotic heart disease of native coronary artery without angina pectoris: Secondary | ICD-10-CM | POA: Diagnosis not present

## 2022-01-30 DIAGNOSIS — I214 Non-ST elevation (NSTEMI) myocardial infarction: Secondary | ICD-10-CM

## 2022-01-30 DIAGNOSIS — E785 Hyperlipidemia, unspecified: Secondary | ICD-10-CM

## 2022-01-30 DIAGNOSIS — R0989 Other specified symptoms and signs involving the circulatory and respiratory systems: Secondary | ICD-10-CM | POA: Diagnosis not present

## 2022-01-30 NOTE — Assessment & Plan Note (Signed)
History of hyperlipidemia on Crestor with lipid profile performed 03/01/2021 revealing total cholesterol 116, LDL 58 and HDL 44.

## 2022-01-30 NOTE — Assessment & Plan Note (Signed)
Ongoing tobacco abuse of 1 pack/day recalcitrant to resector modification.  Does have COPD.

## 2022-01-30 NOTE — Assessment & Plan Note (Signed)
History of CAD status post cardiac catheterization performed by Dr. Ellyn Hack 02/25/2021 revealing an ostial ramus branch stenosis felt not to be a culprit.  He was treated presumptively for coronary vasospasm.  He did have an episode of chest pain 6 months ago and saw Dr. Audie Box .  He did not think this was ischemically mediated.  2D echo was subsequently ordered was normal.  He had no recurrent symptoms.

## 2022-01-30 NOTE — Patient Instructions (Signed)
  Testing/Procedures:  Your physician has requested that you have a carotid duplex. This test is an ultrasound of the carotid arteries in your neck. It looks at blood flow through these arteries that supply the brain with blood. Allow one hour for this exam. There are no restrictions or special instructions. [facility]   Follow-Up: At [facility], you and your health needs are our priority.  As part of our continuing mission to provide you with exceptional heart care, we have created designated Provider Care Teams.  These Care Teams include your primary Cardiologist (physician) and Advanced Practice Providers (APPs -  Physician Assistants and Nurse Practitioners) who all work together to provide you with the care you need, when you need it.  We recommend signing up for the patient portal called "MyChart".  Sign up information is provided on this After Visit Summary.  MyChart is used to connect with patients for Virtual Visits (Telemedicine).  Patients are able to view lab/test results, encounter notes, upcoming appointments, etc.  Non-urgent messages can be sent to your provider as well.   To learn more about what you can do with MyChart, go to [email or web address].    Your next appointment:   12 month(s)  The format for your next appointment:   In Person  Provider:   Quay Burow, MD

## 2022-01-30 NOTE — Progress Notes (Signed)
01/30/2022 Wesley Schultz   November 20, 1950  [id number]  Primary Physician Sharion Balloon, FNP Primary Cardiologist: Lorretta Harp MD Wesley Schultz, [place]  HPI:  Wesley Schultz is a 71 y.o.  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  He was referred by Dr. Noemi Chapel for preoperative clearance before elective total knee replacement.  I last saw him in the office 08/24/2020. His risk factors include over 50 pack years of tobacco abuse continue to smoke 1 pack/day.  Does have a family history of heart disease the father had a myocardial infarction in his early 10s.  Is never had a heart attack or stroke.  Does have COPD.  Also complains of atypical chest pain occurring every other week.  He has had 3 back surgeries in the past.  He had a Myoview stress test which was low risk and I cleared him for his orthopedic surgery which Dr. Noemi Chapel performed without incident.   He was admitted to the hospital 07/25/2020 with chest pain.  His troponins rose to 300.  He underwent coronary angiography by Dr. Ellyn Hack revealing an ostial ramus branch lesion thought not to be the culprit and he was treated presumptively for coronary vasospasm with low-dose amlodipine.    Since I saw him a year and a half ago he did see Dr. Jenetta Downer. O'Neal 08/16/2021 with atypical chest pain thought not to be ischemically mediated.  Subsequent 2D echo was normal.  He does continue to smoke a pack a day recalcitrant to risk factor modification.  No outpatient medications have been marked as taking for the 01/30/22 encounter (Office Visit) with Lorretta Harp, MD.     Allergies  Allergen Reactions   Codeine Nausea Only    Social History   Socioeconomic History   Marital status: Married    Spouse name: Not on file   Number of children: Not on file   Years of education: Not on file   Highest education level: Not on file  Occupational History   Not on file  Tobacco Use   Smoking  status: Every Day    Packs/day: 1.00    Years: 40.00    Total pack years: 40.00    Types: Cigarettes   Smokeless tobacco: Never  Substance and Sexual Activity   Alcohol use: No   Drug use: No   Sexual activity: Never  Other Topics Concern   Not on file  Social History Narrative   Not on file   Social Determinants of Health   Financial Resource Strain: Not on file  Food Insecurity: Not on file  Transportation Needs: Not on file  Physical Activity: Not on file  Stress: Not on file  Social Connections: Not on file  Intimate Partner Violence: Not on file     Review of Systems: General: negative for chills, fever, night sweats or weight changes.  Cardiovascular: negative for chest pain, dyspnea on exertion, edema, orthopnea, palpitations, paroxysmal nocturnal dyspnea or shortness of breath Dermatological: negative for rash Respiratory: negative for cough or wheezing Urologic: negative for hematuria Abdominal: negative for nausea, vomiting, diarrhea, bright red blood per rectum, melena, or hematemesis Neurologic: negative for visual changes, syncope, or dizziness All other systems reviewed and are otherwise negative except as noted above.    Blood pressure (!) 100/58, pulse 66, SpO2 90 %.  General appearance: alert and no distress Neck: no adenopathy, no JVD, supple, symmetrical, trachea midline, thyroid not enlarged,  symmetric, no tenderness/mass/nodules, and soft right carotid bruit Lungs: clear to auscultation bilaterally Heart: regular rate and rhythm, S1, S2 normal, no murmur, click, rub or gallop Extremities: extremities normal, atraumatic, no cyanosis or edema Pulses: 2+ and symmetric Skin: Skin color, texture, turgor normal. No rashes or lesions Neurologic: Grossly normal  EKG sinus rhythm at 66 without ST or T wave changes.  Personally reviewed this EKG.  ASSESSMENT AND PLAN:   Smoker Ongoing tobacco abuse of 1 pack/day recalcitrant to resector modification.   Does have COPD.  Hyperlipidemia LDL goal <70 History of hyperlipidemia on Crestor with lipid profile performed 03/01/2021 revealing total cholesterol 116, LDL 58 and HDL 44.  NSTEMI (non-ST elevated myocardial infarction) [facility]); delayed presentation History of CAD status post cardiac catheterization performed by Dr. Ellyn Hack 02/25/2021 revealing an ostial ramus branch stenosis felt not to be a culprit.  He was treated presumptively for coronary vasospasm.  He did have an episode of chest pain 6 months ago and saw Dr. Audie Box .  He did not think this was ischemically mediated.  2D echo was subsequently ordered was normal.  He had no recurrent symptoms.     Lorretta Harp MD FACP,FACC,FAHA, [facility] 01/30/2022 3:35 PM

## 2022-02-01 NOTE — Addendum Note (Signed)
Addended by: Deanna Artis A on: 02/01/2022 09:52 AM   Modules accepted: Orders

## 2022-02-01 NOTE — Addendum Note (Signed)
Addended by: Deanna Artis A on: 02/01/2022 10:02 AM   Modules accepted: Orders

## 2022-02-06 ENCOUNTER — Ambulatory Visit (HOSPITAL_COMMUNITY)
Admission: RE | Admit: 2022-02-06 | Discharge: 2022-02-06 | Disposition: A | Discharge status: Home / Self Care | Payer: Medicare Other | Source: Ambulatory Visit | Attending: Cardiovascular Disease | Admitting: Cardiovascular Disease

## 2022-02-06 DIAGNOSIS — R0989 Other specified symptoms and signs involving the circulatory and respiratory systems: Secondary | ICD-10-CM | POA: Insufficient documentation

## 2022-02-15 DIAGNOSIS — Z23 Encounter for immunization: Secondary | ICD-10-CM | POA: Diagnosis not present

## 2022-03-06 DIAGNOSIS — Z23 Encounter for immunization: Secondary | ICD-10-CM | POA: Diagnosis not present

## 2022-06-21 DIAGNOSIS — M5416 Radiculopathy, lumbar region: Secondary | ICD-10-CM | POA: Diagnosis not present

## 2022-07-03 DIAGNOSIS — M5416 Radiculopathy, lumbar region: Secondary | ICD-10-CM | POA: Diagnosis not present

## 2022-08-01 ENCOUNTER — Other Ambulatory Visit: Payer: Self-pay | Admitting: Cardiovascular Disease

## 2022-08-27 DIAGNOSIS — M5416 Radiculopathy, lumbar region: Secondary | ICD-10-CM | POA: Diagnosis not present

## 2022-12-20 ENCOUNTER — Telehealth: Payer: Self-pay | Admitting: Cardiovascular Disease

## 2022-12-20 NOTE — Telephone Encounter (Signed)
Patient walk in.  States left rib pain for 2 months.  No nausea, vomiting, sweating.  No chest pain or radiating pain.  Advised he could see his PCP but prefers cardiologist.  Wesley Schultz will have someone reach ot to schedule.  He has appt with Dr Allyson Sabal for 11/12 but needs sooner to address this issue.   Called patient and LM that there is an opening next Tuesday at 2:20 with APP C Irene Limbo.  Placed on and waiting for him to call to confirm

## 2022-12-20 NOTE — Telephone Encounter (Signed)
Patient came in 12/20/2022. Patient was having symptoms and wanted to talk to someone. Triage nurse came to check on him.

## 2022-12-21 NOTE — Telephone Encounter (Signed)
LE: Called patient 8/22 to advise of appt placed waiting for him to call to verify.  Called today and verified the appointment

## 2022-12-22 NOTE — Progress Notes (Unsigned)
Cardiology Office Note:    Date:  12/25/2022   ID:  Wesley Schultz, DOB 07-Sep-1950, MRN [id number]  PCP:  Junie Spencer, FNP  Cardiologist:  Nanetta Batty, MD  Electrophysiologist:  None   Referring MD: Junie Spencer, FNP   Chief Complaint: rib pain  History of Present Illness:    Wesley Schultz is a 72 y.o. male with a history of CAD with NSTEMI in 06/2020 (treated medically for presumed coronary vasospasm, bilateral carotid stenosis (right > left), hyperlipidemia, COPD, and ongoing tobacco abuse who is followed by Dr. Allyson Sabal and presents today for further evaluation of "rib pain."  Patient has a history of atypical chest pain. Myoview in 10/2019 prior to a knee surgery showed a defect in the mid inferior, apical inferior, and apex location consistent with ischemia. However, Dr. Allyson Sabal personally reviewed images and felt like defect was due to attenuation rather than ischemia. Therefore, he was cleared for surgery. He was admitted in 06/2020 for NSTEMI. LHC showed diffuse mild non-obstructive disease with 50% stenosis of a ramus lesion but no culprit lesion for elevated troponin. Echo at that time showed LVEF of 55-60% with normal wall motion. He was treated for presumed coronary vasospasm and started on Amlodipine. He was seen by Dr. Flora Lipps in 07/2021 for atypical chest pain that was not felt to be ischemic in nature. Repeat Echo was ordered at that time which showed LVEF of 55-60% with normal wall motion and grade 1 diastolic dysfunction.   He was last seen by Dr. Allyson Sabal in 01/2022 at which time he was doing well with no recurrent chest pain. A right carotid bruit was noted on exam so carotid dopplers were ordered and showed 60-79% stenosis of right ICA and 1-39% stenosis of left ICA.  He walked into our office on 12/20/2022 requesting a visit for further evaluation of left rib pain for the last 2 months. Therefore, this visit was arranged for further evaluation. He reports intermittent left  sided pain that he describes as a "cramp" at the bottom of his ribs. This pain occurs at rest and lasts for about 10 about about 10 minutes and then goes away after massaging the area. He has had about 10 episodes of this over the last 2-3 months. He denies any other chest pain. He has chronic shortness of breath which he states this is due to his asthma and long smoking history. No new or worsening shortness of breath. No orthopnea or PND. He describes some mild ankle edema (left > right) since his knee surgeries that improve when he elevates his legs overnight. No palpitations, lightheadedness, dizziness, or syncope.  Of note, he did stop his statin due to concerns that this was causing weight loss.   EKGs/Labs/Other Studies Reviewed:    The following studies were reviewed:  Left Cardiac Catheterization 07/26/2020: Ramus lesion is 50% stenosed. The left ventricular systolic function is normal. LV end diastolic pressure is normal. The left ventricular ejection fraction is 55-65% by visual estimate. There is no aortic valve stenosis.  Diffuse nonobstructive disease.  No culprit lesion for elevated troponin.  Continue preventive therapy.    Diagnostic Dominance: Left  _______________  Echocardiogram 08/25/2021: Impression:  1. Left ventricular ejection fraction, by estimation, is 55 to 60%. The  left ventricle has normal function. The left ventricle has no regional  wall motion abnormalities. Left ventricular diastolic parameters are  consistent with Grade I diastolic  dysfunction (impaired relaxation).   2. Right ventricular systolic  function is normal. The right ventricular  size is normal.   3. Left atrial size was mildly dilated.   4. A small pericardial effusion is present. The pericardial effusion is  surrounding the apex.   5. The mitral valve is grossly normal. No evidence of mitral valve  regurgitation.   6. The aortic valve was not well visualized. Aortic valve  regurgitation  is not visualized.   7. The inferior vena cava is normal in size with greater than 50%  respiratory variability, suggesting right atrial pressure of 3 mmHg.   Comparison(s): No significant change from prior study. EF 55%.    EKG:  EKG ordered today. EKG personally reviewed and demonstrates normal sinus rhythm, rate 63 bpm, with non-specific T wave changes. Normal axis. Normal PR and QRS intervals. QTc 417 ms.  Recent Labs: No results found for requested labs within last 365 days.  Recent Lipid Panel    Component Value Date/Time   CHOL 116 03/01/2021 1038   TRIG 64 03/01/2021 1038   HDL 44 03/01/2021 1038   CHOLHDL 2.6 03/01/2021 1038   CHOLHDL 3.0 07/26/2020 0326   VLDL 7 07/26/2020 0326   LDLCALC 58 03/01/2021 1038    Physical Exam:    Vital Signs: BP (!) 116/56 (BP Location: Left Arm, Patient Position: Sitting, Cuff Size: Normal)   Pulse 63   Ht 6' (1.829 m)   Wt 178 lb 3.2 oz (80.8 kg)   SpO2 92%   BMI 24.17 kg/m     Wt Readings from Last 3 Encounters:  12/25/22 178 lb 3.2 oz (80.8 kg)  08/16/21 164 lb 3.2 oz (74.5 kg)  03/03/21 168 lb (76.2 kg)     General: 72 y.o. Caucasian male in no acute distress. HEENT: Normocephalic and atraumatic. Sclera clear.  Neck: Supple. Right carotid bruit noted. No JVD. Heart: RRR. Distinct S1 and S2. No murmurs, gallops, or rubs.  Lungs: No increased work of breathing. Rare scattered expiratory wheeze. Otherwise, clear to auscultation.  Abdomen: Soft, non-distended, and non-tender to palpation.  Extremities: Mild bilateral ankle edema (left > right).  Skin: Warm and dry. Neuro: Alert and oriented x3. No focal deficits. Psych: Normal affect. Responds appropriately.   Assessment:    1. Rib pain on left side   2. Coronary artery disease involving native coronary artery of native heart without angina pectoris   3. Bilateral carotid artery disease, unspecified type (HCC)   4. Hyperlipidemia, unspecified  hyperlipidemia type   5. Tobacco abuse     Plan:    Rib Pain CAD  Patient has a history of atypical chest pain. He was admitted for a NSTEMI in 06/2020. LHC at that time showed diffuse mild non-obstructive disease with 50% stenosis of a ramus lesion but no culprit lesion for elevated troponin. He was treated medically for presumed coronary vasospasm.  - Patient reports some left sided rib pain at rest that resolves with massaging the area but no real chest pain. - EKG shows no acute ischemic changes. - Continue Amlodipine 2.5mg  daily.  - Continue Aspirin 81mg  daily.  - Recommended restarting Crestor as below. - Symptoms do not sound cardiac in nature. No additional ischemic work-up necessary at this time. However, advised patient to let us know if he has any new or worsening symptoms.  Carotid Artery Disease Carotid dopplers in 01/2022 showed 60-79% stenosis of right ICA and 1-39% stenosis of left ICA. - Continue aspirin and statin. - Will order repeat dopplers to be completed in 01/2023.  Hyperlipidemia Lipid panel in 02/2021: Total Cholesterol 116, Triglycerides 64, HDL 44, LDL 58. LDL goal <70 given CAD.  - He states he recently stopped his Crestor because he thought it may be causing weight loss. However, he is willing to retry this. Therefore, will restart Crestor 20mg  daily and he will let us know if he has recurrent weight loss with this. - He is due to repeat lipid panel and CMET. He is not fasting today today so will come back for this (will try to do when he comes in for carotid dopplers).  Tobacco Abuse He has a long smoking history (about 50 years) and continues to smoke.  - Discussed importance of tobacco cessation and offered Nicotine patches but he does not seem ready to quit.  Disposition: Keep follow-up visit with Dr. Allyson Sabal in 03/2023.   Medication Adjustments/Labs and Tests Ordered: Current medicines are reviewed at length with the patient today.  Concerns  regarding medicines are outlined above.  Orders Placed This Encounter  Procedures   Lipid panel   Comprehensive metabolic panel   EKG 12-Lead   VAS US CAROTID   No orders of the defined types were placed in this encounter.   Patient Instructions  Medication Instructions:  RESTART CRESTOR 20 MG DAILY  *If you need a refill on your cardiac medications before your next appointment, please call your pharmacy*   Lab Work: TO BE DONE AT THE SOMETIME AS THE CAROTID: FASTING LIPIDS , CMET If you have labs (blood work) drawn today and your tests are completely normal, you will receive your results only by: MyChart Message (if you have MyChart) OR A paper copy in the mail If you have any lab test that is abnormal or we need to change your treatment, we will call you to review the results.   Testing/Procedures: Your physician has requested that you have a carotid duplex. This test is an ultrasound of the carotid arteries in your neck. It looks at blood flow through these arteries that supply the brain with blood. Allow one hour for this exam. There are no restrictions or special instructions. TO SCHEDULED A MORNING APPOINTMENT    Follow-Up: At [facility], you and your health needs are our priority.  As part of our continuing mission to provide you with exceptional heart care, we have created designated Provider Care Teams.  These Care Teams include your primary Cardiologist (physician) and Advanced Practice Providers (APPs -  Physician Assistants and Nurse Practitioners) who all work together to provide you with the care you need, when you need it.  We recommend signing up for the patient portal called "MyChart".  Sign up information is provided on this After Visit Summary.  MyChart is used to connect with patients for Virtual Visits (Telemedicine).  Patients are able to view lab/test results, encounter notes, upcoming appointments, etc.  Non-urgent messages can be sent to your  provider as well.   To learn more about what you can do with MyChart, go to [email or web address].    Your next appointment:   KEEP SCHEDULED FOLLOW-UP   Signed, Smitty Knudsen  12/25/2022 4:48 PM     [facility]

## 2022-12-25 ENCOUNTER — Ambulatory Visit: Payer: Medicare Other | Attending: Student | Admitting: Student

## 2022-12-25 ENCOUNTER — Encounter: Payer: Self-pay | Admitting: Student

## 2022-12-25 VITALS — BP 116/56 | HR 63 | Ht 72.0 in | Wt 178.2 lb

## 2022-12-25 DIAGNOSIS — Z720 Tobacco use: Secondary | ICD-10-CM | POA: Diagnosis not present

## 2022-12-25 DIAGNOSIS — I779 Disorder of arteries and arterioles, unspecified: Secondary | ICD-10-CM | POA: Diagnosis not present

## 2022-12-25 DIAGNOSIS — R0781 Pleurodynia: Secondary | ICD-10-CM | POA: Insufficient documentation

## 2022-12-25 DIAGNOSIS — E785 Hyperlipidemia, unspecified: Secondary | ICD-10-CM | POA: Insufficient documentation

## 2022-12-25 DIAGNOSIS — I2510 Atherosclerotic heart disease of native coronary artery without angina pectoris: Secondary | ICD-10-CM | POA: Diagnosis not present

## 2022-12-25 NOTE — Patient Instructions (Signed)
Medication Instructions:  RESTART CRESTOR 20 MG DAILY  *If you need a refill on your cardiac medications before your next appointment, please call your pharmacy*   Lab Work: TO BE DONE AT THE SOMETIME AS THE CAROTID: FASTING LIPIDS , CMET If you have labs (blood work) drawn today and your tests are completely normal, you will receive your results only by: MyChart Message (if you have MyChart) OR A paper copy in the mail If you have any lab test that is abnormal or we need to change your treatment, we will call you to review the results.   Testing/Procedures: Your physician has requested that you have a carotid duplex. This test is an ultrasound of the carotid arteries in your neck. It looks at blood flow through these arteries that supply the brain with blood. Allow one hour for this exam. There are no restrictions or special instructions. TO SCHEDULED A MORNING APPOINTMENT    Follow-Up: At [facility], you and your health needs are our priority.  As part of our continuing mission to provide you with exceptional heart care, we have created designated Provider Care Teams.  These Care Teams include your primary Cardiologist (physician) and Advanced Practice Providers (APPs -  Physician Assistants and Nurse Practitioners) who all work together to provide you with the care you need, when you need it.  We recommend signing up for the patient portal called "MyChart".  Sign up information is provided on this After Visit Summary.  MyChart is used to connect with patients for Virtual Visits (Telemedicine).  Patients are able to view lab/test results, encounter notes, upcoming appointments, etc.  Non-urgent messages can be sent to your provider as well.   To learn more about what you can do with MyChart, go to [email or web address].    Your next appointment:   KEEP SCHEDULED FOLLOW-UP

## 2022-12-26 ENCOUNTER — Other Ambulatory Visit: Payer: Self-pay | Admitting: Cardiovascular Disease

## 2023-01-08 ENCOUNTER — Ambulatory Visit (HOSPITAL_COMMUNITY)
Admission: RE | Admit: 2023-01-08 | Discharge: 2023-01-08 | Disposition: A | Discharge status: Home / Self Care | Payer: Medicare Other | Source: Ambulatory Visit | Attending: Cardiology | Admitting: Cardiology

## 2023-01-08 ENCOUNTER — Other Ambulatory Visit: Payer: Self-pay

## 2023-01-08 DIAGNOSIS — I6521 Occlusion and stenosis of right carotid artery: Secondary | ICD-10-CM

## 2023-01-08 DIAGNOSIS — E785 Hyperlipidemia, unspecified: Secondary | ICD-10-CM | POA: Diagnosis not present

## 2023-01-08 DIAGNOSIS — I779 Disorder of arteries and arterioles, unspecified: Secondary | ICD-10-CM | POA: Diagnosis not present

## 2023-01-08 DIAGNOSIS — I2510 Atherosclerotic heart disease of native coronary artery without angina pectoris: Secondary | ICD-10-CM | POA: Diagnosis not present

## 2023-01-09 LAB — LIPID PANEL
Chol/HDL Ratio: 2.2 ratio (ref 0.0–5.0)
Cholesterol, Total: 112 mg/dL (ref 100–199)
HDL: 52 mg/dL (ref >39)
LDL Chol Calc (NIH): 49 mg/dL (ref 0–99)
Triglycerides: 46 mg/dL (ref 0–149)
VLDL Cholesterol Cal: 11 mg/dL (ref 5–40)

## 2023-01-09 LAB — COMPREHENSIVE METABOLIC PANEL
ALT: 20 IU/L (ref 0–44)
AST: 30 IU/L (ref 0–40)
Albumin: 4.3 g/dL (ref 3.8–4.8)
Alkaline Phosphatase: 101 IU/L (ref 44–121)
BUN/Creatinine Ratio: 21 (ref 10–24)
BUN: 18 mg/dL (ref 8–27)
Bilirubin Total: 0.5 mg/dL (ref 0.0–1.2)
CO2: 27 mmol/L (ref 20–29)
Calcium: 9.8 mg/dL (ref 8.6–10.2)
Chloride: 101 mmol/L (ref 96–106)
Creatinine, Ser: 0.86 mg/dL (ref 0.76–1.27)
Globulin, Total: 2.3 g/dL (ref 1.5–4.5)
Glucose: 74 mg/dL (ref 70–99)
Potassium: 4.7 mmol/L (ref 3.5–5.2)
Sodium: 142 mmol/L (ref 134–144)
Total Protein: 6.6 g/dL (ref 6.0–8.5)
eGFR: 93 mL/min/{1.73_m2} (ref >59)

## 2023-01-14 ENCOUNTER — Other Ambulatory Visit (HOSPITAL_COMMUNITY): Payer: Self-pay

## 2023-02-06 DIAGNOSIS — Z23 Encounter for immunization: Secondary | ICD-10-CM | POA: Diagnosis not present

## 2023-03-12 ENCOUNTER — Encounter: Payer: Self-pay | Admitting: Cardiovascular Disease

## 2023-03-12 ENCOUNTER — Ambulatory Visit: Payer: Medicare Other | Attending: Cardiovascular Disease | Admitting: Cardiovascular Disease

## 2023-03-12 VITALS — BP 126/66 | HR 68 | Ht 72.0 in | Wt 181.0 lb

## 2023-03-12 DIAGNOSIS — I214 Non-ST elevation (NSTEMI) myocardial infarction: Secondary | ICD-10-CM | POA: Insufficient documentation

## 2023-03-12 DIAGNOSIS — E785 Hyperlipidemia, unspecified: Secondary | ICD-10-CM | POA: Insufficient documentation

## 2023-03-12 DIAGNOSIS — I6521 Occlusion and stenosis of right carotid artery: Secondary | ICD-10-CM | POA: Diagnosis not present

## 2023-03-12 DIAGNOSIS — F17200 Nicotine dependence, unspecified, uncomplicated: Secondary | ICD-10-CM | POA: Insufficient documentation

## 2023-03-12 DIAGNOSIS — I779 Disorder of arteries and arterioles, unspecified: Secondary | ICD-10-CM | POA: Insufficient documentation

## 2023-03-12 NOTE — Patient Instructions (Addendum)
Medication Instructions:  Your physician recommends that you continue on your current medications as directed. Please refer to the Current Medication list given to you today.  *If you need a refill on your cardiac medications before your next appointment, please call your pharmacy*    Testing/Procedures: Your physician has requested that you have a carotid duplex. This test is an ultrasound of the carotid arteries in your neck. It looks at blood flow through these arteries that supply the brain with blood. Allow one hour for this exam. There are no restrictions or special instructions. This will take place at [address], [address]. **To do in September 2025**  Please note: We ask at that you not bring children with you during ultrasound (echo/ vascular) testing. Due to room size and safety concerns, children are not allowed in the ultrasound rooms during exams. Our front office staff cannot provide observation of children in our lobby area while testing is being conducted. An adult accompanying a patient to their appointment will only be allowed in the ultrasound room at the discretion of the ultrasound technician under special circumstances. We apologize for any inconvenience.    Follow-Up: At [facility], you and your health needs are our priority.  As part of our continuing mission to provide you with exceptional heart care, we have created designated Provider Care Teams.  These Care Teams include your primary Cardiologist (physician) and Advanced Practice Providers (APPs -  Physician Assistants and Nurse Practitioners) who all work together to provide you with the care you need, when you need it.  We recommend signing up for the patient portal called "MyChart".  Sign up information is provided on this After Visit Summary.  MyChart is used to connect with patients for Virtual Visits (Telemedicine).  Patients are able to view lab/test results, encounter notes, upcoming  appointments, etc.  Non-urgent messages can be sent to your provider as well.   To learn more about what you can do with MyChart, go to [email or web address].    Your next appointment:   12 month(s)  Provider:   Marjie Skiff, PA-C       Then, Nanetta Batty, MD will plan to see you again in 2 year(s).

## 2023-03-12 NOTE — Assessment & Plan Note (Signed)
Carotid duplex performed 01/08/2023 showed moderate right ICA stenosis.  This will be repeated in 1 year.

## 2023-03-12 NOTE — Assessment & Plan Note (Signed)
History of non-STEMI in the past with catheterization performed by Dr. Herbie Baltimore 07/25/2020 revealing only ostial ramus branch disease.  A culprit vessel was never identified.  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  He was placed on low-dose amlodipine.  He was seen by Marjie Skiff in August of this year with rib pain which she did not think was ischemic.  This has resolved.

## 2023-03-12 NOTE — Progress Notes (Signed)
03/12/2023 [name]   26-Jan-1951  [id number]  Primary Physician Wesley Spencer, FNP Primary Cardiologist: Wesley Gess MD Wesley Schultz, [place]  HPI:  Wesley Schultz is a 72 y.o.  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  He was referred by Dr. Thurston Schultz for preoperative clearance before elective total knee replacement.  I last saw him in the office 01/31/2019. His risk factors include over 50 pack years of tobacco abuse continue to smoke 1 pack/day.  Does have a family history of heart disease the father had a myocardial infarction in his early 47s.  Is never had a heart attack or stroke.  Does have COPD.  Also complains of atypical chest pain occurring every other week.  He has had 3 back surgeries in the past.  He had a Myoview stress test which was low risk and I cleared him for his orthopedic surgery which Dr. Thurston Schultz performed without incident.   He was admitted to the hospital 07/25/2020 with chest pain.  His troponins rose to 300.  He underwent coronary angiography by Dr. Herbie Schultz revealing an ostial ramus branch lesion thought not to be the culprit and he was treated presumptively for coronary vasospasm with low-dose amlodipine.     Since I saw him a year ago he continues to do well.  He still smokes a pack a day.  He saw Wesley Skiff, PA-C in the office and August of this year for atypical "left rib pain" which she did not think was ischemically mediated.  This has resolved.  He does otherwise have symptoms compatible with COPD.   Current Meds  Medication Sig   amLODipine (NORVASC) 2.5 MG tablet Take 1 tablet by mouth once daily   aspirin EC 81 MG tablet Take 1 tablet (81 mg total) by mouth daily. Swallow whole.   nitroGLYCERIN (NITROSTAT) 0.4 MG SL tablet Place 1 tablet (0.4 mg total) under the tongue every 5 (five) minutes as needed for chest pain.   rosuvastatin (CRESTOR) 20 MG tablet Take 1 tablet by mouth once daily      Allergies  Allergen Reactions   Codeine Nausea Only    Social History   Socioeconomic History   Marital status: Married    Spouse name: Not on file   Number of children: Not on file   Years of education: Not on file   Highest education level: Not on file  Occupational History   Not on file  Tobacco Use   Smoking status: Every Day    Current packs/day: 1.00    Average packs/day: 1 pack/day for 40.0 years (40.0 ttl pk-yrs)    Types: Cigarettes   Smokeless tobacco: Never  Substance and Sexual Activity   Alcohol use: No   Drug use: No   Sexual activity: Never  Other Topics Concern   Not on file  Social History Narrative   Not on file   Social Determinants of Health   Financial Resource Strain: Not on file  Food Insecurity: Not on file  Transportation Needs: Not on file  Physical Activity: Not on file  Stress: Not on file  Social Connections: Not on file  Intimate Partner Violence: Not on file     Review of Systems: General: negative for chills, fever, night sweats or weight changes.  Cardiovascular: negative for chest pain, dyspnea on exertion, edema, orthopnea, palpitations, paroxysmal nocturnal dyspnea or shortness of breath Dermatological: negative for rash Respiratory:  negative for cough or wheezing Urologic: negative for hematuria Abdominal: negative for nausea, vomiting, diarrhea, bright red blood per rectum, melena, or hematemesis Neurologic: negative for visual changes, syncope, or dizziness All other systems reviewed and are otherwise negative except as noted above.    Blood pressure 126/66, pulse 68, height 6' (1.829 m), weight 181 lb (82.1 kg), SpO2 91%.  General appearance: alert and no distress Neck: no adenopathy, no carotid bruit, no JVD, supple, symmetrical, trachea midline, and thyroid not enlarged, symmetric, no tenderness/mass/nodules Lungs: clear to auscultation bilaterally Heart: regular rate and rhythm, S1, S2 normal, no murmur, click,  rub or gallop Extremities: extremities normal, atraumatic, no cyanosis or edema Pulses: 2+ and symmetric Skin: Skin color, texture, turgor normal. No rashes or lesions Neurologic: Grossly normal  EKG not performed today      ASSESSMENT AND PLAN:   Smoker Ongoing tobacco abuse of 1 pack/day recalcitrant to risk factor modification.  Hyperlipidemia LDL goal <70 History of hyperlipidemia on statin therapy with lipid profile performed 01/08/2023 revealing total cholesterol 112, LDL 49 and HDL 52.  NSTEMI (non-ST elevated myocardial infarction) [facility]); delayed presentation History of non-STEMI in the past with catheterization performed by Dr. Herbie Schultz 07/25/2020 revealing only ostial ramus branch disease.  A culprit vessel was never identified.  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  He was placed on low-dose amlodipine.  He was seen by Wesley Schultz in August of this year with rib pain which she did not think was ischemic.  This has resolved.  Carotid artery disease (HCC) Carotid duplex performed 01/08/2023 showed moderate right ICA stenosis.  This will be repeated in 1 year.     Wesley Gess MD FACP,FACC,FAHA, [facility] 03/12/2023 1:57 PM

## 2023-03-12 NOTE — Assessment & Plan Note (Signed)
History of hyperlipidemia on statin therapy with lipid profile performed 01/08/2023 revealing total cholesterol 112, LDL 49 and HDL 52.

## 2023-03-12 NOTE — Assessment & Plan Note (Signed)
Ongoing tobacco abuse of 1 pack/day recalcitrant to risk factor modification. 

## 2023-04-27 ENCOUNTER — Other Ambulatory Visit: Payer: Self-pay | Admitting: Cardiovascular Disease

## 2023-09-06 IMAGING — DX DG CHEST 2V
3 series · 3 of 3 positions shown · non-contrast
Comparison: 07/25/2020.

CLINICAL DATA: Cough.

EXAM:
CHEST - 2 VIEW

[chest pa (1 of 2)]
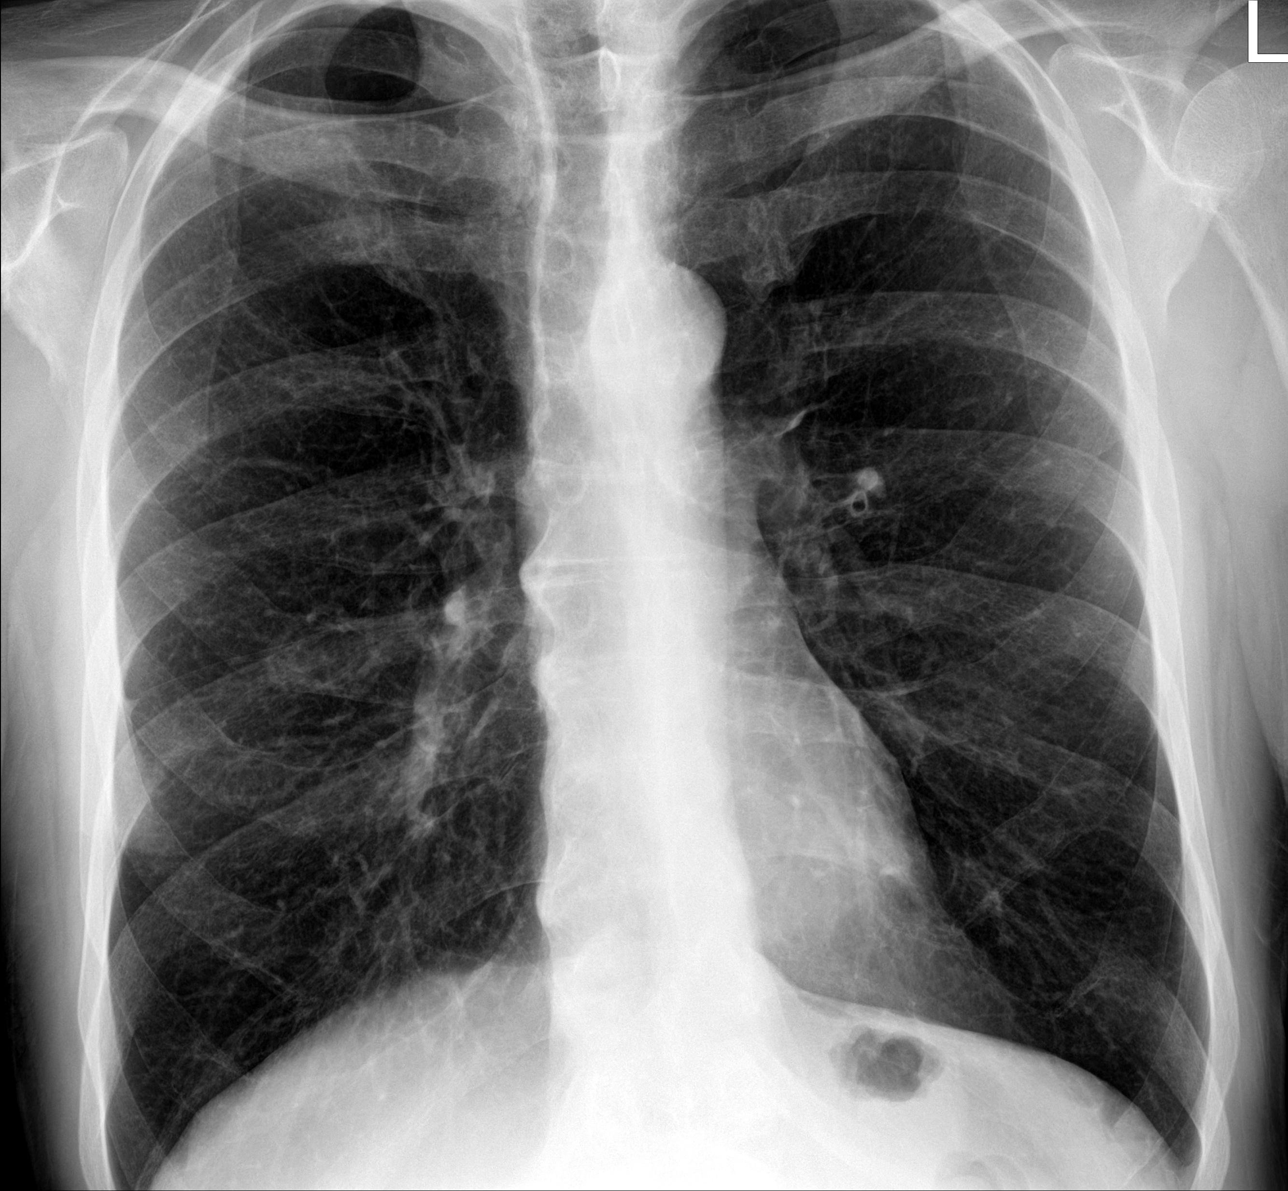

[chest lat]
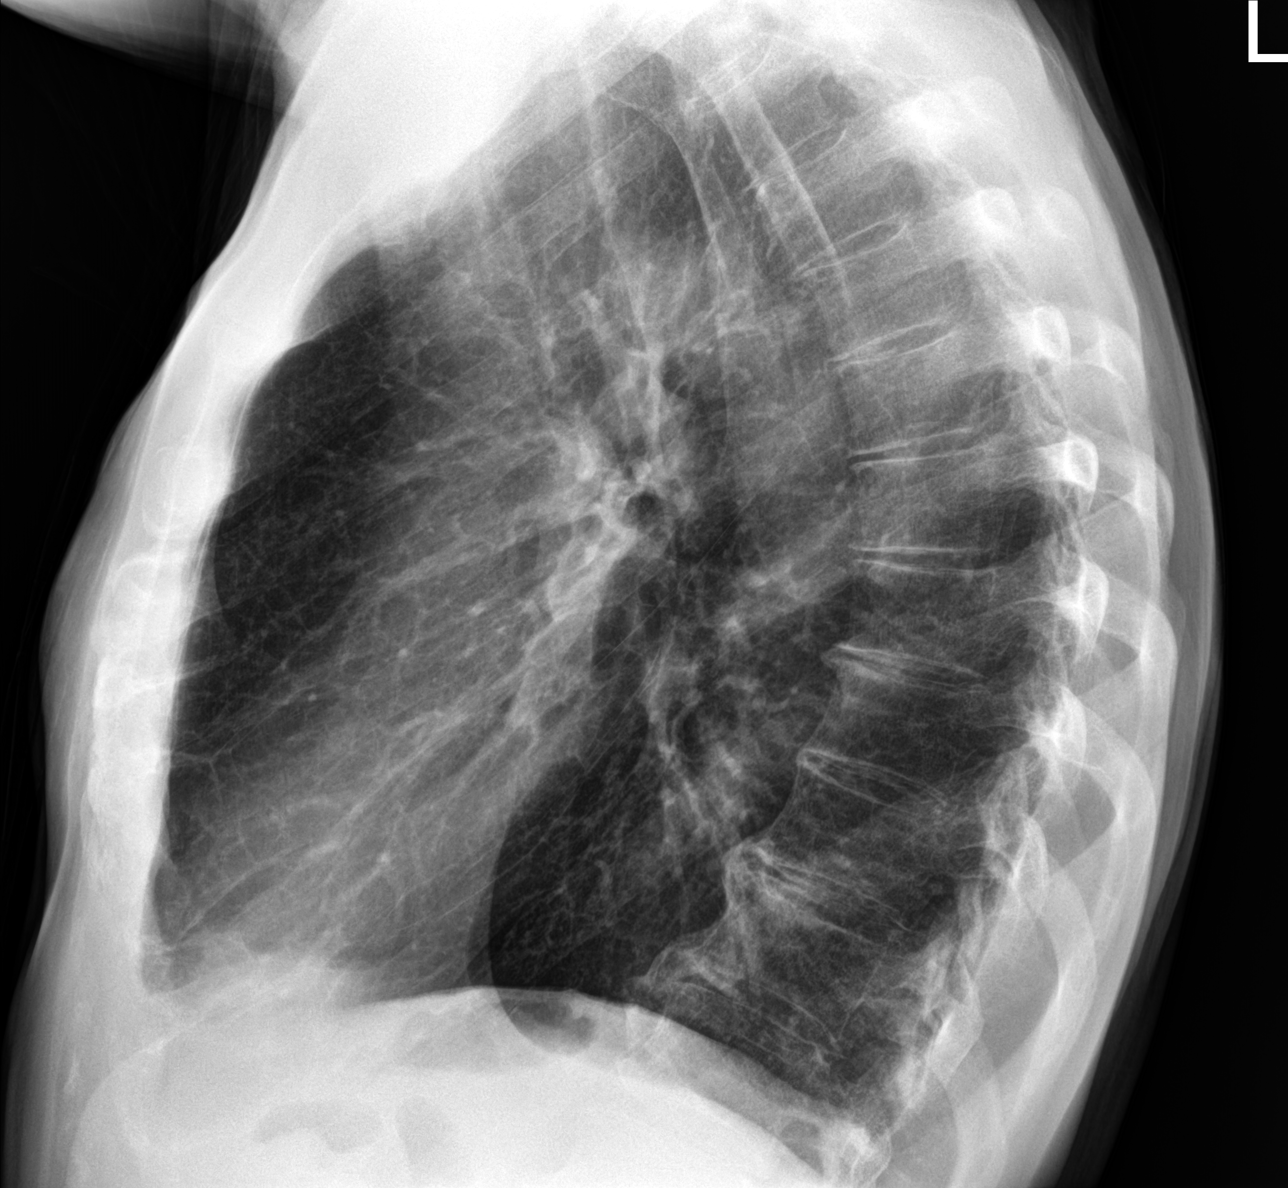

[chest pa (2 of 2)]
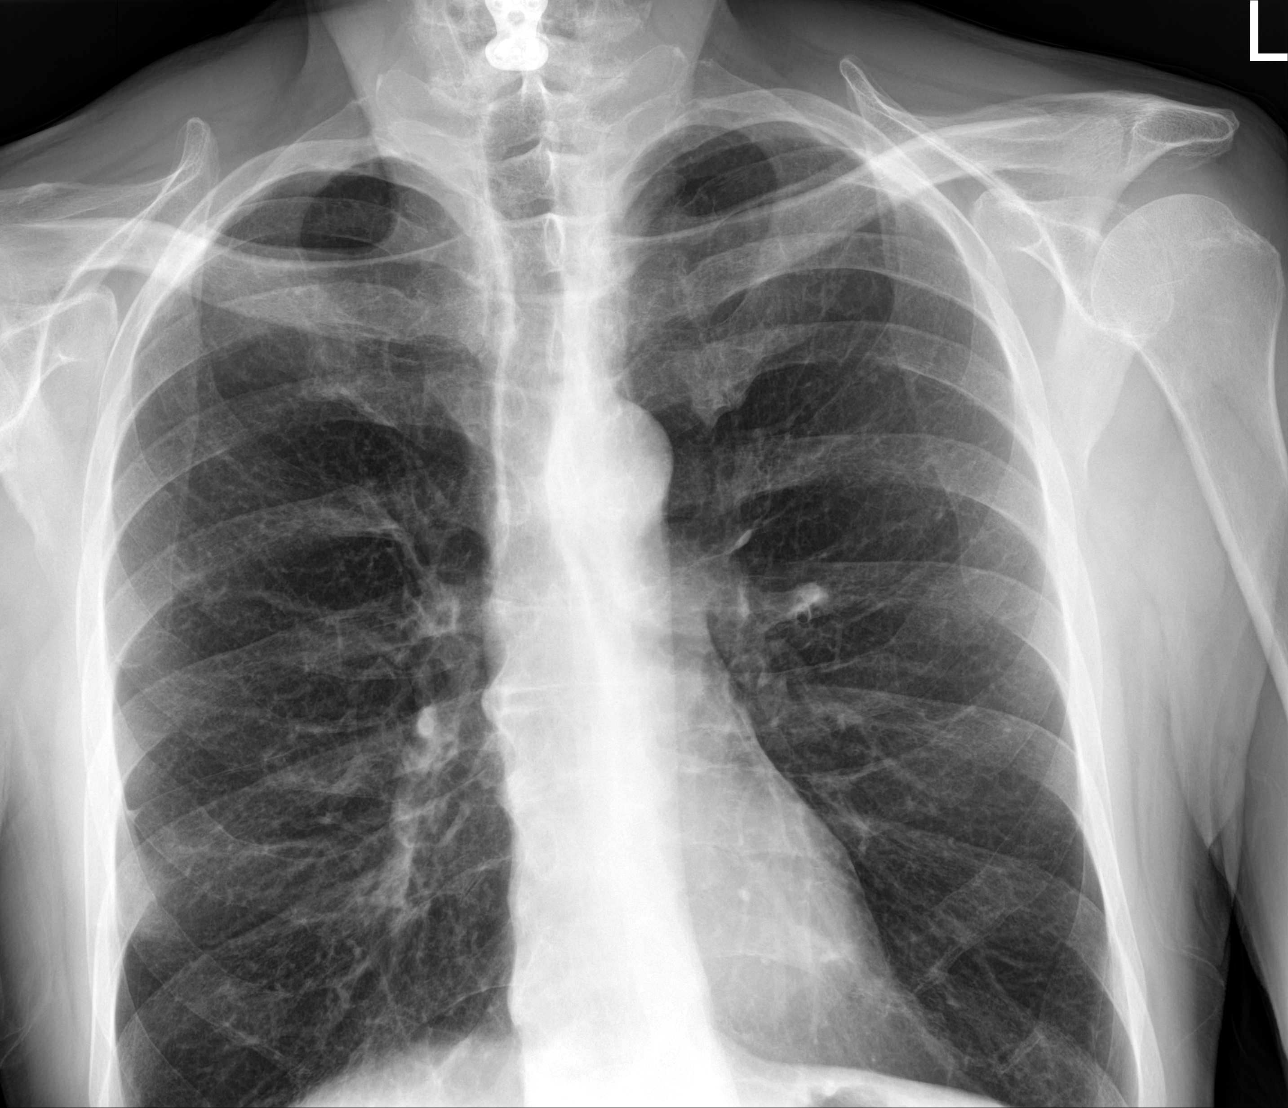

[3 of 3 positions shown; findings below may reference images not displayed]

FINDINGS: Cardiac silhouette is normal in size. Normal mediastinal and hilar
contours.

Lungs are hyperexpanded, but clear. No pleural effusion or
pneumothorax.

Skeletal structures are intact.
IMPRESSION: No acute cardiopulmonary disease.

## 2024-01-02 ENCOUNTER — Ambulatory Visit (HOSPITAL_COMMUNITY)
Admission: RE | Admit: 2024-01-02 | Discharge: 2024-01-02 | Disposition: A | Discharge status: Home / Self Care | Payer: Medicare Other | Source: Ambulatory Visit | Attending: Cardiovascular Disease | Admitting: Cardiovascular Disease

## 2024-01-02 DIAGNOSIS — E785 Hyperlipidemia, unspecified: Secondary | ICD-10-CM | POA: Diagnosis not present

## 2024-01-02 DIAGNOSIS — I6521 Occlusion and stenosis of right carotid artery: Secondary | ICD-10-CM | POA: Insufficient documentation

## 2024-01-02 DIAGNOSIS — F17200 Nicotine dependence, unspecified, uncomplicated: Secondary | ICD-10-CM | POA: Diagnosis present

## 2024-01-02 DIAGNOSIS — I214 Non-ST elevation (NSTEMI) myocardial infarction: Secondary | ICD-10-CM | POA: Diagnosis not present

## 2024-01-04 ENCOUNTER — Ambulatory Visit: Payer: Self-pay | Admitting: Cardiovascular Disease

## 2024-02-17 ENCOUNTER — Other Ambulatory Visit: Payer: Self-pay | Admitting: Cardiovascular Disease

## 2024-03-03 ENCOUNTER — Ambulatory Visit: Admitting: Family Medicine

## 2024-03-03 ENCOUNTER — Telehealth: Payer: Self-pay | Admitting: Family

## 2024-03-03 VITALS — BP 110/64 | HR 65 | Temp 98.3°F | Ht 72.0 in | Wt 175.0 lb

## 2024-03-03 DIAGNOSIS — J019 Acute sinusitis, unspecified: Secondary | ICD-10-CM | POA: Diagnosis not present

## 2024-03-03 DIAGNOSIS — B9689 Other specified bacterial agents as the cause of diseases classified elsewhere: Secondary | ICD-10-CM | POA: Diagnosis not present

## 2024-03-03 DIAGNOSIS — Z0279 Encounter for issue of other medical certificate: Secondary | ICD-10-CM

## 2024-03-03 MED ORDER — AMOXICILLIN-POT CLAVULANATE 875-125 MG PO TABS: 1.0000 | ORAL_TABLET | Freq: Two times a day (BID) | ORAL | 0 refills | Status: AC

## 2024-03-03 NOTE — Progress Notes (Signed)
 Acute Office Visit  Patient ID: Wesley Schultz, male    DOB: 1950-06-27, 73 y.o.   MRN: [id number]  PCP: Lavell Bari LABOR, FNP  Chief Complaint  Patient presents with   SICK VISIT    Runny nose, chest congestion, and cough for about 1 week. Patient states that he has been taking OTC allergy medicine which provides some relief but only for a short time. Patient reports getting his COVID and FLU vaccinations about a week before symptoms started.    Subjective:     HPI  Discussed the use of AI scribe software for clinical note transcription with the patient, who gave verbal consent to proceed.  History of Present Illness   Wesley Schultz is a 73 year old male who presents with symptoms of a head cold and chest congestion.  Upper respiratory symptoms - Onset of symptoms one week ago. - Significant nasal drainage without change in color - Cough with sputum production - Facial and head pressure - No ear pain - No fever, vomiting, or diarrhea - Symptoms have remained stable without significant improvement or worsening since onset  Asthma - Lifelong history of asthma - Uses Primatene Mist for symptom control - No recent use of antibiotics in the past couple of months       ROS     Objective:    BP 110/64   Pulse 65   Temp 98.3 F (36.8 C)   Ht 6' (1.829 m)   Wt 175 lb (79.4 kg)   SpO2 93%   BMI 23.73 kg/m    Physical Exam Vitals reviewed.  Constitutional:      Appearance: Normal appearance.  HENT:     Head: Normocephalic and atraumatic.     Right Ear: Tympanic membrane, ear canal and external ear normal.     Left Ear: Tympanic membrane, ear canal and external ear normal.     Nose: Nose normal.     Mouth/Throat:     Comments: PND observed to the posterior pharynx Eyes:     Extraocular Movements: Extraocular movements intact.     Conjunctiva/sclera: Conjunctivae normal.     Pupils: Pupils are equal, round, and reactive to light.  Cardiovascular:     Rate and  Rhythm: Normal rate and regular rhythm.     Pulses: Normal pulses.     Heart sounds: Normal heart sounds. No murmur heard. Pulmonary:     Effort: Pulmonary effort is normal. No respiratory distress.     Breath sounds: Normal breath sounds.  Musculoskeletal:        General: No deformity. Normal range of motion.     Cervical back: Normal range of motion and neck supple.  Skin:    General: Skin is warm and dry.     Capillary Refill: Capillary refill takes less than 2 seconds.  Neurological:     General: No focal deficit present.     Mental Status: He is alert and oriented to person, place, and time.  Psychiatric:        Mood and Affect: Mood normal.        Behavior: Behavior normal.       No results found for any visits on 03/03/24.     Assessment & Plan:   Problem List Items Addressed This Visit   None Visit Diagnoses       Acute bacterial sinusitis    -  Primary   Relevant Medications   amoxicillin -clavulanate (AUGMENTIN ) 875-125 MG tablet  Assessment and Plan    Acute sinusitis Symptoms suggest sinus infection due to lack of improvement over one week. - Prescribed augmentin  BID x 7 days - Discussed gastrointestinal side effects of antibiotics and recommended probiotics or yogurt.  Asthma Chronic asthma with current inhaler use. - Continue primatine mist inhaler as needed      Follow up if symptoms acutely worsen, no improvement over the next 2-3 days, fever develops, or for any other concerns   Meds ordered this encounter  Medications   amoxicillin -clavulanate (AUGMENTIN ) 875-125 MG tablet    Sig: Take 1 tablet by mouth 2 (two) times daily for 7 days.    Dispense:  14 tablet    Refill:  0    Supervising Provider:   JOLINDA NORENE HERO [[id number]]     Return if symptoms worsen or fail to improve.  Wesley LELON Severin, FNP [facility]

## 2024-03-03 NOTE — Patient Instructions (Signed)
 Follow up if symptoms acutely worsen, no improvement over the next 2-3 days, fever develops, or for any other concerns

## 2024-03-03 NOTE — Telephone Encounter (Signed)
 PT dropped off HANDICAP forms to be completed and signed.  Form Fee Paid? (YES)            If NO, form is placed on front office manager desk to hold until payment received. If YES, then form will be placed in the RX/HH Nurse Coordinators box for completion.  Form will not be processed until payment is received

## 2024-03-11 ENCOUNTER — Ambulatory Visit: Admitting: Family Medicine

## 2024-03-11 VITALS — BP 115/75 | HR 73 | Temp 97.8°F | Ht 72.0 in | Wt 177.2 lb

## 2024-03-11 DIAGNOSIS — J441 Chronic obstructive pulmonary disease with (acute) exacerbation: Secondary | ICD-10-CM

## 2024-03-11 DIAGNOSIS — Z23 Encounter for immunization: Secondary | ICD-10-CM | POA: Insufficient documentation

## 2024-03-11 MED ORDER — ALBUTEROL SULFATE HFA 108 (90 BASE) MCG/ACT IN AERS: 2.0000 | INHALATION_SPRAY | Freq: Four times a day (QID) | RESPIRATORY_TRACT | 0 refills | Status: AC | PRN

## 2024-03-11 MED ORDER — DOXYCYCLINE HYCLATE 100 MG PO TABS: 100.0000 mg | ORAL_TABLET | Freq: Two times a day (BID) | ORAL | 0 refills | Status: AC

## 2024-03-11 NOTE — Progress Notes (Signed)
 Acute Office Visit  Patient ID: Wesley Schultz, male    DOB: 1951/01/15, 73 y.o.   MRN: [id number]  PCP: Lavell Bari LABOR, FNP  Chief Complaint  Patient presents with   Follow-up    Patient was seen for Acute Bacterial Sinusitis on 03/03/24 and was prescribed Amoxicillin . Patient completed course of the medication and reports no improvement with symptoms, which include runny nose and chest congestion.    Subjective:     HPI  Discussed the use of AI scribe software for clinical note transcription with the patient, who gave verbal consent to proceed.  History of Present Illness   Wesley Schultz is a 73 year old male with COPD who presents with persistent sinus symptoms and cough.  -Patient seen on 03/03/24 and diagnosed with sinus infection and prescribed augmentin . Symptoms did not improve with augmentin .  - Persistent sinus symptoms including rhinorrhea and productive cough - Phlegm production is a consistent issue at baseline per the patient.  - Patient states that he has hx of COPD and smokes. Reports that he always coughs up phlegm but that it is worse over the past week.  - No fever, vomiting, or diarrhea - Uses Primatene inhaler  Tobacco and substance use - Smoking history of over fifty years     Patient states that he has not seen his PCP in multiple years  ROS     Objective:    BP 115/75   Pulse 73   Temp 97.8 F (36.6 C)   Ht 6' (1.829 m)   Wt 177 lb 3.2 oz (80.4 kg)   SpO2 95%   BMI 24.03 kg/m    Physical Exam Vitals reviewed.  Constitutional:      Appearance: Normal appearance.  HENT:     Head: Normocephalic and atraumatic.     Right Ear: Tympanic membrane, ear canal and external ear normal.     Left Ear: Tympanic membrane, ear canal and external ear normal.     Nose: Nose normal.  Eyes:     Extraocular Movements: Extraocular movements intact.     Conjunctiva/sclera: Conjunctivae normal.     Pupils: Pupils are equal, round, and reactive to light.   Cardiovascular:     Rate and Rhythm: Normal rate and regular rhythm.     Pulses: Normal pulses.     Heart sounds: Normal heart sounds.  Pulmonary:     Effort: Pulmonary effort is normal. No respiratory distress.     Breath sounds: No rhonchi.     Comments: Scattered mild wheezes Musculoskeletal:        General: No deformity. Normal range of motion.     Cervical back: Normal range of motion.  Skin:    General: Skin is warm and dry.     Capillary Refill: Capillary refill takes less than 2 seconds.  Neurological:     General: No focal deficit present.     Mental Status: He is alert and oriented to person, place, and time.  Psychiatric:        Mood and Affect: Mood normal.        Behavior: Behavior normal.       No results found for any visits on 03/11/24.     Assessment & Plan:   Problem List Items Addressed This Visit   None Visit Diagnoses       Chronic obstructive pulmonary disease with acute exacerbation (HCC)    -  Primary   Relevant Medications   albuterol  (VENTOLIN   HFA) 108 (90 Base) MCG/ACT inhaler   doxycycline (VIBRA-TABS) 100 MG tablet       Assessment and Plan    Chronic obstructive pulmonary disease with acute exacerbation - Prescribed doxycycline BID x 7 days for likely COPD exacerbation - Prescribed albuterol  inhaler to trial - Advised follow-up with primary care for lung health maintenance screening - Follow up if symptoms acutely worsen, no improvement over the next 2-3 days, fever develops, or for any other concerns          Meds ordered this encounter  Medications   albuterol  (VENTOLIN  HFA) 108 (90 Base) MCG/ACT inhaler    Sig: Inhale 2 puffs into the lungs every 6 (six) hours as needed for wheezing or shortness of breath.    Dispense:  8 g    Refill:  0    Supervising Provider:   JOLINDA NORENE HERO [[id number]]   doxycycline (VIBRA-TABS) 100 MG tablet    Sig: Take 1 tablet (100 mg total) by mouth 2 (two) times daily for 7 days. 1 po bid     Dispense:  14 tablet    Refill:  0    Supervising Provider:   JOLINDA NORENE HERO [[id number]]    No follow-ups on file.  Wesley LELON Severin, FNP [facility]

## 2024-03-11 NOTE — Patient Instructions (Signed)
 Follow up if symptoms acutely worsen, no improvement over the next 2-3 days, fever develops, or for any other concerns

## 2024-03-20 ENCOUNTER — Other Ambulatory Visit: Payer: Self-pay | Admitting: Cardiovascular Disease

## 2024-04-22 ENCOUNTER — Other Ambulatory Visit: Payer: Self-pay | Admitting: Cardiovascular Disease

## 2024-05-04 ENCOUNTER — Telehealth: Payer: Self-pay | Admitting: Cardiovascular Disease

## 2024-05-04 NOTE — Telephone Encounter (Signed)
" °*  STAT* If patient is at the pharmacy, call can be transferred to refill team.   1. Which medications need to be refilled? (please list name of each medication and dose if known)   amLODipine  (NORVASC ) 2.5 MG tablet    rosuvastatin  (CRESTOR ) 20 MG tablet     2. Would you like to learn more about the convenience, safety, & potential cost savings by using the [facility]? No    3. Are you open to using the [facility] (Type [facility]. No    4. Which pharmacy/location (including street and city if local pharmacy) is medication to be sent to?[facility] - [place], [place] - [address]    5. Do they need a 30 day or 90 day supply? 90 day    Pt out of medication  "

## 2024-05-06 MED ORDER — AMLODIPINE BESYLATE 2.5 MG PO TABS: 2.5000 mg | ORAL_TABLET | Freq: Every day | ORAL | 0 refills | Status: DC

## 2024-05-06 MED ORDER — ROSUVASTATIN CALCIUM 20 MG PO TABS: 20.0000 mg | ORAL_TABLET | Freq: Every day | ORAL | 0 refills | Status: DC

## 2024-05-06 NOTE — Telephone Encounter (Signed)
 Pt's medications were sent to pt's pharmacy as requested. Confirmation received.

## 2024-05-19 ENCOUNTER — Ambulatory Visit: Admitting: Cardiovascular Disease

## 2024-05-19 ENCOUNTER — Encounter: Payer: Self-pay | Admitting: Cardiovascular Disease

## 2024-05-19 VITALS — BP 120/60 | HR 67 | Ht 72.0 in | Wt 172.2 lb

## 2024-05-19 DIAGNOSIS — I2510 Atherosclerotic heart disease of native coronary artery without angina pectoris: Secondary | ICD-10-CM | POA: Insufficient documentation

## 2024-05-19 DIAGNOSIS — I6521 Occlusion and stenosis of right carotid artery: Secondary | ICD-10-CM | POA: Insufficient documentation

## 2024-05-19 DIAGNOSIS — I779 Disorder of arteries and arterioles, unspecified: Secondary | ICD-10-CM | POA: Diagnosis not present

## 2024-05-19 DIAGNOSIS — F17200 Nicotine dependence, unspecified, uncomplicated: Secondary | ICD-10-CM | POA: Diagnosis not present

## 2024-05-19 DIAGNOSIS — E785 Hyperlipidemia, unspecified: Secondary | ICD-10-CM | POA: Insufficient documentation

## 2024-05-19 DIAGNOSIS — I214 Non-ST elevation (NSTEMI) myocardial infarction: Secondary | ICD-10-CM | POA: Insufficient documentation

## 2024-05-19 MED ORDER — ROSUVASTATIN CALCIUM 20 MG PO TABS: 20.0000 mg | ORAL_TABLET | Freq: Every day | ORAL | 3 refills | Status: AC

## 2024-05-19 MED ORDER — AMLODIPINE BESYLATE 2.5 MG PO TABS: 2.5000 mg | ORAL_TABLET | Freq: Every day | ORAL | 3 refills | Status: AC

## 2024-05-19 NOTE — Progress Notes (Signed)
 "     05/19/2024 Wesley Schultz   1950-12-24  [id number]  Primary Physician Lavell Bari LABOR, FNP Primary Cardiologist: Dorn JINNY Lesches MD GENI CODY MADEIRA, FSCAI  HPI:  Wesley Schultz is a 74 y.o.  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  He was referred by Dr. Jane for preoperative clearance before elective total knee replacement.  I last saw him in the office 03/12/2023. His risk factors include over 50 pack years of tobacco abuse continue to smoke 1 pack/day.  Does have a family history of heart disease the father had a myocardial infarction in his early 20s.  Is never had a heart attack or stroke.  Does have COPD.  Also complains of atypical chest pain occurring every other week.  He has had 3 back surgeries in the past.  He had a Myoview  stress test which was low risk and I cleared him for his orthopedic surgery which Dr. Jane performed without incident.   He was admitted to the hospital 07/25/2020 with chest pain.  His troponins rose to 300.  He underwent coronary angiography by Dr. Anner revealing an ostial ramus branch lesion thought not to be the culprit and he was treated presumptively for coronary vasospasm with low-dose amlodipine .     Since I saw him a year ago he continues to do well.  He still smokes a pack a day.  He denies chest pain but does get shortness of breath which he attributes to asthma.  He had carotid Dopplers done 9//25 that showed moderate right ICA stenosis.   Active Medications[1]   Allergies[2]  Social History   Socioeconomic History   Marital status: Married    Spouse name: Not on file   Number of children: Not on file   Years of education: Not on file   Highest education level: Not on file  Occupational History   Not on file  Tobacco Use   Smoking status: Every Day    Current packs/day: 1.00    Average packs/day: 1 pack/day for 40.0 years (40.0 ttl pk-yrs)    Types: Cigarettes   Smokeless  tobacco: Never   Tobacco comments:    05/19/2024 Patient smokes about a pack daily  Substance and Sexual Activity   Alcohol use: No   Drug use: No   Sexual activity: Never  Other Topics Concern   Not on file  Social History Narrative   Not on file   Social Drivers of Health   Tobacco Use: High Risk (05/19/2024)   Patient History    Smoking Tobacco Use: Every Day    Smokeless Tobacco Use: Never    Passive Exposure: Not on file  Financial Resource Strain: Not on file  Food Insecurity: Not on file  Transportation Needs: Not on file  Physical Activity: Not on file  Stress: Not on file  Social Connections: Not on file  Intimate Partner Violence: Not on file  Depression (PHQ2-9): Low Risk (03/11/2024)   Depression (PHQ2-9)    PHQ-2 Score: 0  Alcohol Screen: Not on file  Housing: Not on file  Utilities: Not on file  Health Literacy: Not on file     Review of Systems: General: negative for chills, fever, night sweats or weight changes.  Cardiovascular: negative for chest pain, dyspnea on exertion, edema, orthopnea, palpitations, paroxysmal nocturnal dyspnea or shortness of breath Dermatological: negative for rash Respiratory: negative for cough or wheezing Urologic: negative for hematuria Abdominal: negative  for nausea, vomiting, diarrhea, bright red blood per rectum, melena, or hematemesis Neurologic: negative for visual changes, syncope, or dizziness All other systems reviewed and are otherwise negative except as noted above.    Blood pressure 120/60, pulse 67, height 6' (1.829 m), weight 172 lb 3.2 oz (78.1 kg), SpO2 91%.  General appearance: alert and no distress Neck: no adenopathy, no carotid bruit, no JVD, supple, symmetrical, trachea midline, and thyroid not enlarged, symmetric, no tenderness/mass/nodules Lungs: clear to auscultation bilaterally Heart: regular rate and rhythm, S1, S2 normal, no murmur, click, rub or gallop Extremities: extremities normal,  atraumatic, no cyanosis or edema Pulses: 2+ and symmetric Skin: Skin color, texture, turgor normal. No rashes or lesions Neurologic: Grossly normal  EKG EKG Interpretation Date/Time:  Tuesday May 19 2024 14:57:31 EST Ventricular Rate:  67 PR Interval:    QRS Duration:  86 QT Interval:  376 QTC Calculation: 397 R Axis:   73  Text Interpretation: Accelerated Junctional rhythm Nonspecific ST and T wave abnormality When compared with ECG of 26-Jul-2020 08:16, PREVIOUS ECG IS PRESENT Confirmed by Court Carrier [phone]) on 05/19/2024 3:13:36 PM    ASSESSMENT AND PLAN:   Smoker Ongoing tobacco abuse of 1 pack/day recalcitrant to risk factor modification.  Hyperlipidemia LDL goal <70 History of hyperlipidemia on statin therapy lipid profile performed 01/08/2023 revealing total cholesterol 112, LDL 49 HDL 52.  Carotid artery disease History of moderate right ICA stenosis by duplex ultrasound 01/02/24.  This to be repeated on annual basis.  NSTEMI (non-ST elevated myocardial infarction) [facility]); delayed presentation Cardiac catheterization performed 07/17/2020 by Dr. Dann showed diffuse nonobstructive CAD.  It was presumed that his symptoms were related to coronary vasospasm.  He is at goal for secondary prevention.  He said no recurrent symptoms.     Carrier DOROTHA Court MD FACP,FACC,FAHA, FSCAI 05/19/2024 3:21 PM     [1]  Current Meds  Medication Sig   albuterol  (VENTOLIN  HFA) 108 (90 Base) MCG/ACT inhaler Inhale 2 puffs into the lungs every 6 (six) hours as needed for wheezing or shortness of breath.   amLODipine  (NORVASC ) 2.5 MG tablet Take 1 tablet (2.5 mg total) by mouth daily.   aspirin  EC 81 MG tablet Take 1 tablet (81 mg total) by mouth daily. Swallow whole.   ePHEDrine HCl (PRIMATENE PO) Take by mouth as needed.   rosuvastatin  (CRESTOR ) 20 MG tablet Take 1 tablet (20 mg total) by mouth daily.  [2]  Allergies Allergen Reactions   Codeine Nausea Only   "

## 2024-05-19 NOTE — Assessment & Plan Note (Signed)
Ongoing tobacco abuse of 1 pack/day recalcitrant to risk factor modification. 

## 2024-05-19 NOTE — Assessment & Plan Note (Signed)
 History of hyperlipidemia on statin therapy lipid profile performed 01/08/2023 revealing total cholesterol 112, LDL 49 HDL 52.

## 2024-05-19 NOTE — Assessment & Plan Note (Addendum)
 History of moderate right ICA stenosis by duplex ultrasound 01/02/24.  This to be repeated on annual basis.

## 2024-05-19 NOTE — Assessment & Plan Note (Signed)
 Cardiac catheterization performed 07/17/2020 by Dr. Dann showed diffuse nonobstructive CAD.  It was presumed that his symptoms were related to coronary vasospasm.  He is at goal for secondary prevention.  He said no recurrent symptoms.

## 2024-05-19 NOTE — Patient Instructions (Signed)
 Medication Instructions:  Your physician recommends that you continue on your current medications as directed. Please refer to the Current Medication list given to you today.  *If you need a refill on your cardiac medications before your next appointment, please call your pharmacy*  Testing/Procedures: Your physician has requested that you have a carotid duplex. This test is an ultrasound of the carotid arteries in your neck. It looks at blood flow through these arteries that supply the brain with blood. Allow one hour for this exam. There are no restrictions or special instructions. This will take place at [address], [address] floor  **To do in September**  Please note: We ask at that you not bring children with you during ultrasound (echo/ vascular) testing. Due to room size and safety concerns, children are not allowed in the ultrasound rooms during exams. Our front office staff cannot provide observation of children in our lobby area while testing is being conducted. An adult accompanying a patient to their appointment will only be allowed in the ultrasound room at the discretion of the ultrasound technician under special circumstances. We apologize for any inconvenience.   Follow-Up: At [facility], you and your health needs are our priority.  As part of our continuing mission to provide you with exceptional heart care, our providers are all part of one team.  This team includes your primary Cardiologist (physician) and Advanced Practice Providers or APPs (Physician Assistants and Nurse Practitioners) who all work together to provide you with the care you need, when you need it.  Your next appointment:   12 month(s)  Provider:   Dorn Lesches, MD   We recommend signing up for the patient portal called MyChart.  Sign up information is provided on this After Visit Summary.  MyChart is used to connect with patients for Virtual Visits (Telemedicine).  Patients are able to view  lab/test results, encounter notes, upcoming appointments, etc.  Non-urgent messages can be sent to your provider as well.   To learn more about what you can do with MyChart, go to [email or web address].   Other Instructions

## 2024-05-21 ENCOUNTER — Telehealth: Payer: Self-pay | Admitting: Family

## 2024-05-21 NOTE — Telephone Encounter (Unsigned)
 Copied from CRM #[id number]. Topic: Medical Record Request - Records Request >> May 18, 2024  3:53 PM Deaijah H wrote: Reason for CRM: Patient called in stating he's needing his information regarding his chronic condition sent to Dr. Alm Slocumb fax: [phone] >> May 20, 2024  4:31 PM Graeme ORN wrote: Patient called back. States they have not received information. States he lost his assistance with the [employer] due to not having documented chronic condition. Would like attestation letter faxed to his [facility] provider

## 2024-05-22 NOTE — Telephone Encounter (Signed)
 Pt needs to schedule CPE with me. Have not seen him in years.

## 2024-06-02 ENCOUNTER — Other Ambulatory Visit: Payer: Self-pay

## 2024-06-02 MED ORDER — NITROGLYCERIN 0.4 MG SL SUBL: 0.4000 mg | SUBLINGUAL_TABLET | SUBLINGUAL | 3 refills | Status: AC | PRN
